# Patient Record
Sex: Female | Born: 1945 | Race: White | Hispanic: No | Marital: Married | Smoking: Former smoker
Health system: Southern US, Community
[De-identification: ages and names within clinical notes are randomized; demographics above are authoritative.]

## PROBLEM LIST (undated history)

## (undated) DIAGNOSIS — Z8709 Personal history of other diseases of the respiratory system: Secondary | ICD-10-CM

## (undated) DIAGNOSIS — K449 Diaphragmatic hernia without obstruction or gangrene: Secondary | ICD-10-CM

## (undated) DIAGNOSIS — Z973 Presence of spectacles and contact lenses: Secondary | ICD-10-CM

## (undated) DIAGNOSIS — K219 Gastro-esophageal reflux disease without esophagitis: Secondary | ICD-10-CM

## (undated) DIAGNOSIS — M549 Dorsalgia, unspecified: Secondary | ICD-10-CM

## (undated) DIAGNOSIS — M542 Cervicalgia: Secondary | ICD-10-CM

## (undated) DIAGNOSIS — M47819 Spondylosis without myelopathy or radiculopathy, site unspecified: Secondary | ICD-10-CM

## (undated) DIAGNOSIS — G5603 Carpal tunnel syndrome, bilateral upper limbs: Secondary | ICD-10-CM

## (undated) DIAGNOSIS — F329 Major depressive disorder, single episode, unspecified: Secondary | ICD-10-CM

## (undated) DIAGNOSIS — S0300XA Dislocation of jaw, unspecified side, initial encounter: Secondary | ICD-10-CM

## (undated) DIAGNOSIS — K589 Irritable bowel syndrome without diarrhea: Secondary | ICD-10-CM

## (undated) DIAGNOSIS — L309 Dermatitis, unspecified: Secondary | ICD-10-CM

## (undated) DIAGNOSIS — C801 Malignant (primary) neoplasm, unspecified: Secondary | ICD-10-CM

## (undated) DIAGNOSIS — M1990 Unspecified osteoarthritis, unspecified site: Secondary | ICD-10-CM

## (undated) DIAGNOSIS — F32A Depression, unspecified: Secondary | ICD-10-CM

## (undated) DIAGNOSIS — G8929 Other chronic pain: Secondary | ICD-10-CM

## (undated) DIAGNOSIS — K5909 Other constipation: Secondary | ICD-10-CM

## (undated) DIAGNOSIS — F419 Anxiety disorder, unspecified: Secondary | ICD-10-CM

## (undated) HISTORY — DX: Unspecified osteoarthritis, unspecified site: M19.90

## (undated) HISTORY — PX: CERVIX LESION DESTRUCTION: SHX591

## (undated) HISTORY — PX: BREAST LUMPECTOMY: SHX2

## (undated) HISTORY — PX: JOINT REPLACEMENT: SHX530

## (undated) HISTORY — PX: REDUCTION MAMMAPLASTY: SUR839

## (undated) HISTORY — PX: OTHER SURGICAL HISTORY: SHX169

## (undated) HISTORY — PX: BREAST BIOPSY: SHX20

## (undated) HISTORY — PX: BLADDER SUSPENSION: SHX72

## (undated) HISTORY — PX: BACK SURGERY: SHX140

---
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
## 1986-04-14 HISTORY — PX: BREAST REDUCTION SURGERY: SHX8

## 1986-04-14 HISTORY — PX: DILATION AND CURETTAGE OF UTERUS: SHX78

## 1992-04-14 HISTORY — PX: INCISION AND DRAINAGE ABSCESS / HEMATOMA OF BURSA / KNEE / THIGH: SUR668

## 1992-04-14 HISTORY — PX: KNEE ARTHROSCOPY: SUR90

## 1997-12-08 ENCOUNTER — Other Ambulatory Visit: Admission: RE | Admit: 1997-12-08 | Discharge: 1997-12-08 | Payer: Self-pay | Admitting: Obstetrics and Gynecology

## 1999-01-30 ENCOUNTER — Other Ambulatory Visit: Admission: RE | Admit: 1999-01-30 | Discharge: 1999-01-30 | Payer: Self-pay | Admitting: Obstetrics and Gynecology

## 1999-01-31 ENCOUNTER — Ambulatory Visit (HOSPITAL_COMMUNITY): Admission: RE | Admit: 1999-01-31 | Discharge: 1999-01-31 | Payer: Self-pay | Admitting: Obstetrics and Gynecology

## 1999-01-31 ENCOUNTER — Encounter: Payer: Self-pay | Admitting: Obstetrics and Gynecology

## 1999-05-02 ENCOUNTER — Encounter (INDEPENDENT_AMBULATORY_CARE_PROVIDER_SITE_OTHER): Payer: Self-pay | Admitting: Specialist

## 1999-05-02 ENCOUNTER — Other Ambulatory Visit: Admission: RE | Admit: 1999-05-02 | Discharge: 1999-05-02 | Payer: Self-pay | Admitting: Internal Medicine

## 2000-02-05 ENCOUNTER — Ambulatory Visit (HOSPITAL_COMMUNITY): Admission: RE | Admit: 2000-02-05 | Discharge: 2000-02-05 | Payer: Self-pay | Admitting: Obstetrics and Gynecology

## 2000-02-05 ENCOUNTER — Encounter: Payer: Self-pay | Admitting: Obstetrics and Gynecology

## 2000-02-27 ENCOUNTER — Other Ambulatory Visit: Admission: RE | Admit: 2000-02-27 | Discharge: 2000-02-27 | Payer: Self-pay | Admitting: Obstetrics and Gynecology

## 2001-02-11 ENCOUNTER — Encounter: Payer: Self-pay | Admitting: Obstetrics and Gynecology

## 2001-02-11 ENCOUNTER — Ambulatory Visit (HOSPITAL_COMMUNITY): Admission: RE | Admit: 2001-02-11 | Discharge: 2001-02-11 | Payer: Self-pay | Admitting: Obstetrics and Gynecology

## 2001-03-03 ENCOUNTER — Other Ambulatory Visit: Admission: RE | Admit: 2001-03-03 | Discharge: 2001-03-03 | Payer: Self-pay | Admitting: Obstetrics and Gynecology

## 2002-02-15 ENCOUNTER — Ambulatory Visit (HOSPITAL_COMMUNITY): Admission: RE | Admit: 2002-02-15 | Discharge: 2002-02-15 | Payer: Self-pay | Admitting: Obstetrics and Gynecology

## 2002-02-15 ENCOUNTER — Encounter: Payer: Self-pay | Admitting: Obstetrics and Gynecology

## 2002-03-04 ENCOUNTER — Other Ambulatory Visit: Admission: RE | Admit: 2002-03-04 | Discharge: 2002-03-04 | Payer: Self-pay | Admitting: Obstetrics and Gynecology

## 2003-03-14 ENCOUNTER — Other Ambulatory Visit: Admission: RE | Admit: 2003-03-14 | Discharge: 2003-03-14 | Payer: Self-pay | Admitting: Obstetrics and Gynecology

## 2003-03-14 ENCOUNTER — Ambulatory Visit (HOSPITAL_COMMUNITY): Admission: RE | Admit: 2003-03-14 | Discharge: 2003-03-14 | Payer: Self-pay | Admitting: Obstetrics and Gynecology

## 2003-09-21 ENCOUNTER — Encounter: Admission: RE | Admit: 2003-09-21 | Discharge: 2003-09-21 | Payer: Self-pay | Admitting: Internal Medicine

## 2004-03-19 ENCOUNTER — Other Ambulatory Visit: Admission: RE | Admit: 2004-03-19 | Discharge: 2004-03-19 | Payer: Self-pay | Admitting: *Deleted

## 2004-03-19 ENCOUNTER — Ambulatory Visit (HOSPITAL_COMMUNITY): Admission: RE | Admit: 2004-03-19 | Discharge: 2004-03-19 | Payer: Self-pay | Admitting: Obstetrics and Gynecology

## 2005-03-31 ENCOUNTER — Ambulatory Visit (HOSPITAL_COMMUNITY): Admission: RE | Admit: 2005-03-31 | Discharge: 2005-03-31 | Payer: Self-pay | Admitting: Obstetrics and Gynecology

## 2005-06-23 ENCOUNTER — Other Ambulatory Visit: Admission: RE | Admit: 2005-06-23 | Discharge: 2005-06-23 | Payer: Self-pay | Admitting: Obstetrics and Gynecology

## 2005-08-19 ENCOUNTER — Ambulatory Visit: Payer: Self-pay | Admitting: Internal Medicine

## 2005-08-22 ENCOUNTER — Ambulatory Visit: Payer: Self-pay | Admitting: Cardiovascular Disease

## 2005-08-22 ENCOUNTER — Encounter: Payer: Self-pay | Admitting: Internal Medicine

## 2005-09-17 ENCOUNTER — Encounter: Admission: RE | Admit: 2005-09-17 | Discharge: 2005-09-17 | Payer: Self-pay | Admitting: Internal Medicine

## 2005-09-22 ENCOUNTER — Encounter (INDEPENDENT_AMBULATORY_CARE_PROVIDER_SITE_OTHER): Payer: Self-pay | Admitting: *Deleted

## 2005-09-22 ENCOUNTER — Ambulatory Visit: Payer: Self-pay | Admitting: Internal Medicine

## 2005-12-22 ENCOUNTER — Ambulatory Visit: Payer: Self-pay | Admitting: Internal Medicine

## 2005-12-23 ENCOUNTER — Encounter: Admission: RE | Admit: 2005-12-23 | Discharge: 2005-12-23 | Payer: Self-pay | Admitting: Thoracic Surgery

## 2005-12-25 ENCOUNTER — Ambulatory Visit: Payer: Self-pay | Admitting: Internal Medicine

## 2006-02-23 ENCOUNTER — Encounter: Admission: RE | Admit: 2006-02-23 | Discharge: 2006-02-23 | Payer: Self-pay | Admitting: Obstetrics and Gynecology

## 2006-02-27 ENCOUNTER — Encounter: Payer: Self-pay | Admitting: Internal Medicine

## 2006-04-09 ENCOUNTER — Ambulatory Visit (HOSPITAL_COMMUNITY): Admission: RE | Admit: 2006-04-09 | Discharge: 2006-04-09 | Payer: Self-pay | Admitting: Obstetrics and Gynecology

## 2006-08-04 ENCOUNTER — Ambulatory Visit: Payer: Self-pay | Admitting: Thoracic Surgery

## 2006-08-04 ENCOUNTER — Encounter: Admission: RE | Admit: 2006-08-04 | Discharge: 2006-08-04 | Payer: Self-pay | Admitting: Thoracic Surgery

## 2006-08-05 ENCOUNTER — Other Ambulatory Visit: Admission: RE | Admit: 2006-08-05 | Discharge: 2006-08-05 | Payer: Self-pay | Admitting: Obstetrics and Gynecology

## 2007-04-12 ENCOUNTER — Ambulatory Visit (HOSPITAL_COMMUNITY): Admission: RE | Admit: 2007-04-12 | Discharge: 2007-04-12 | Payer: Self-pay | Admitting: Obstetrics and Gynecology

## 2007-05-27 ENCOUNTER — Ambulatory Visit: Payer: Self-pay | Admitting: Thoracic Surgery

## 2007-05-27 ENCOUNTER — Encounter: Admission: RE | Admit: 2007-05-27 | Discharge: 2007-05-27 | Payer: Self-pay | Admitting: Thoracic Surgery

## 2007-08-25 ENCOUNTER — Other Ambulatory Visit: Admission: RE | Admit: 2007-08-25 | Discharge: 2007-08-25 | Payer: Self-pay | Admitting: Obstetrics and Gynecology

## 2008-02-23 ENCOUNTER — Ambulatory Visit: Payer: Self-pay | Admitting: Thoracic Surgery

## 2008-02-23 ENCOUNTER — Encounter: Admission: RE | Admit: 2008-02-23 | Discharge: 2008-02-23 | Payer: Self-pay | Admitting: Thoracic Surgery

## 2008-04-19 ENCOUNTER — Ambulatory Visit (HOSPITAL_COMMUNITY): Admission: RE | Admit: 2008-04-19 | Discharge: 2008-04-19 | Payer: Self-pay | Admitting: Internal Medicine

## 2008-04-26 ENCOUNTER — Encounter: Admission: RE | Admit: 2008-04-26 | Discharge: 2008-04-26 | Payer: Self-pay | Admitting: Internal Medicine

## 2008-07-21 ENCOUNTER — Ambulatory Visit (HOSPITAL_BASED_OUTPATIENT_CLINIC_OR_DEPARTMENT_OTHER): Admission: RE | Admit: 2008-07-21 | Discharge: 2008-07-21 | Payer: Self-pay | Admitting: Orthopedic Surgery

## 2008-08-18 ENCOUNTER — Emergency Department (HOSPITAL_COMMUNITY): Admission: EM | Admit: 2008-08-18 | Discharge: 2008-08-18 | Payer: Self-pay | Admitting: Emergency Medicine

## 2009-01-03 ENCOUNTER — Encounter: Payer: Self-pay | Admitting: Nurse Practitioner

## 2009-01-05 ENCOUNTER — Encounter: Admission: RE | Admit: 2009-01-05 | Discharge: 2009-01-05 | Payer: Self-pay | Admitting: Internal Medicine

## 2009-02-28 ENCOUNTER — Ambulatory Visit: Payer: Self-pay | Admitting: Thoracic Surgery

## 2009-02-28 ENCOUNTER — Encounter: Payer: Self-pay | Admitting: Internal Medicine

## 2009-02-28 ENCOUNTER — Encounter: Admission: RE | Admit: 2009-02-28 | Discharge: 2009-02-28 | Payer: Self-pay | Admitting: Thoracic Surgery

## 2009-03-15 ENCOUNTER — Encounter: Payer: Self-pay | Admitting: Internal Medicine

## 2009-03-15 ENCOUNTER — Telehealth: Payer: Self-pay | Admitting: Internal Medicine

## 2009-03-22 ENCOUNTER — Ambulatory Visit: Payer: Self-pay | Admitting: Gastroenterology

## 2009-03-22 DIAGNOSIS — K5900 Constipation, unspecified: Secondary | ICD-10-CM | POA: Insufficient documentation

## 2009-03-22 DIAGNOSIS — Z860100 Personal history of colon polyps, unspecified: Secondary | ICD-10-CM | POA: Insufficient documentation

## 2009-03-22 DIAGNOSIS — R141 Gas pain: Secondary | ICD-10-CM | POA: Insufficient documentation

## 2009-03-22 DIAGNOSIS — F329 Major depressive disorder, single episode, unspecified: Secondary | ICD-10-CM | POA: Insufficient documentation

## 2009-03-22 DIAGNOSIS — F411 Generalized anxiety disorder: Secondary | ICD-10-CM | POA: Insufficient documentation

## 2009-03-22 DIAGNOSIS — F3289 Other specified depressive episodes: Secondary | ICD-10-CM | POA: Insufficient documentation

## 2009-03-22 DIAGNOSIS — R142 Eructation: Secondary | ICD-10-CM

## 2009-03-22 DIAGNOSIS — K219 Gastro-esophageal reflux disease without esophagitis: Secondary | ICD-10-CM | POA: Insufficient documentation

## 2009-03-22 DIAGNOSIS — R143 Flatulence: Secondary | ICD-10-CM

## 2009-03-26 ENCOUNTER — Telehealth: Payer: Self-pay | Admitting: Internal Medicine

## 2009-03-28 ENCOUNTER — Telehealth: Payer: Self-pay | Admitting: Nurse Practitioner

## 2009-03-30 ENCOUNTER — Ambulatory Visit: Payer: Self-pay | Admitting: Gastroenterology

## 2009-03-30 DIAGNOSIS — R933 Abnormal findings on diagnostic imaging of other parts of digestive tract: Secondary | ICD-10-CM | POA: Insufficient documentation

## 2009-03-30 DIAGNOSIS — R1031 Right lower quadrant pain: Secondary | ICD-10-CM | POA: Insufficient documentation

## 2009-04-02 LAB — CONVERTED CEMR LAB
ALT: 27 units/L (ref 0–35)
AST: 31 units/L (ref 0–37)
Albumin: 3.7 g/dL (ref 3.5–5.2)
Alkaline Phosphatase: 57 units/L (ref 39–117)
BUN: 10 mg/dL (ref 6–23)
Basophils Absolute: 0.1 10*3/uL (ref 0.0–0.1)
Basophils Relative: 1 % (ref 0.0–3.0)
CO2: 32 meq/L (ref 19–32)
Calcium: 9.3 mg/dL (ref 8.4–10.5)
Chloride: 103 meq/L (ref 96–112)
Creatinine, Ser: 0.8 mg/dL (ref 0.4–1.2)
Eosinophils Absolute: 0.1 10*3/uL (ref 0.0–0.7)
Eosinophils Relative: 2.3 % (ref 0.0–5.0)
GFR calc non Af Amer: 77.24 mL/min (ref >60)
Glucose, Bld: 89 mg/dL (ref 70–99)
HCT: 43.1 % (ref 36.0–46.0)
Hemoglobin: 15 g/dL (ref 12.0–15.0)
Lymphocytes Relative: 36.7 % (ref 12.0–46.0)
Lymphs Abs: 1.9 10*3/uL (ref 0.7–4.0)
MCHC: 34.8 g/dL (ref 30.0–36.0)
MCV: 97.5 fL (ref 78.0–100.0)
Monocytes Absolute: 0.9 10*3/uL (ref 0.1–1.0)
Monocytes Relative: 17.8 % — ABNORMAL HIGH (ref 3.0–12.0)
Neutro Abs: 2.1 10*3/uL (ref 1.4–7.7)
Neutrophils Relative %: 42.2 % — ABNORMAL LOW (ref 43.0–77.0)
Platelets: 239 10*3/uL (ref 150.0–400.0)
Potassium: 4 meq/L (ref 3.5–5.1)
RBC: 4.42 M/uL (ref 3.87–5.11)
RDW: 12.4 % (ref 11.5–14.6)
Sodium: 139 meq/L (ref 135–145)
Total Bilirubin: 0.7 mg/dL (ref 0.3–1.2)
Total Protein: 6 g/dL (ref 6.0–8.3)
WBC: 5.1 10*3/uL (ref 4.5–10.5)

## 2009-04-03 ENCOUNTER — Ambulatory Visit: Payer: Self-pay | Admitting: Cardiology

## 2009-04-09 ENCOUNTER — Ambulatory Visit: Payer: Self-pay | Admitting: Gastroenterology

## 2009-04-18 ENCOUNTER — Telehealth: Payer: Self-pay | Admitting: Internal Medicine

## 2009-04-26 ENCOUNTER — Ambulatory Visit (HOSPITAL_COMMUNITY): Admission: RE | Admit: 2009-04-26 | Discharge: 2009-04-26 | Payer: Self-pay | Admitting: Obstetrics and Gynecology

## 2009-05-23 ENCOUNTER — Telehealth: Payer: Self-pay | Admitting: Nurse Practitioner

## 2009-06-08 ENCOUNTER — Ambulatory Visit: Payer: Self-pay | Admitting: Internal Medicine

## 2009-06-08 DIAGNOSIS — R109 Unspecified abdominal pain: Secondary | ICD-10-CM | POA: Insufficient documentation

## 2009-06-21 ENCOUNTER — Telehealth: Payer: Self-pay | Admitting: Internal Medicine

## 2009-06-22 ENCOUNTER — Ambulatory Visit (HOSPITAL_COMMUNITY): Admission: RE | Admit: 2009-06-22 | Discharge: 2009-06-22 | Payer: Self-pay | Admitting: Internal Medicine

## 2009-06-25 ENCOUNTER — Telehealth: Payer: Self-pay | Admitting: Internal Medicine

## 2009-07-02 ENCOUNTER — Ambulatory Visit (HOSPITAL_COMMUNITY): Admission: RE | Admit: 2009-07-02 | Discharge: 2009-07-02 | Payer: Self-pay | Admitting: Internal Medicine

## 2009-07-09 ENCOUNTER — Telehealth: Payer: Self-pay | Admitting: Internal Medicine

## 2009-07-12 ENCOUNTER — Telehealth: Payer: Self-pay | Admitting: Internal Medicine

## 2009-09-12 ENCOUNTER — Telehealth: Payer: Self-pay | Admitting: Internal Medicine

## 2009-09-26 ENCOUNTER — Telehealth: Payer: Self-pay | Admitting: Internal Medicine

## 2009-11-12 ENCOUNTER — Telehealth: Payer: Self-pay | Admitting: Internal Medicine

## 2010-04-30 ENCOUNTER — Ambulatory Visit (HOSPITAL_COMMUNITY)
Admission: RE | Admit: 2010-04-30 | Discharge: 2010-04-30 | Payer: Self-pay | Source: Home / Self Care | Attending: Obstetrics and Gynecology | Admitting: Obstetrics and Gynecology

## 2010-05-05 ENCOUNTER — Encounter: Payer: Self-pay | Admitting: Internal Medicine

## 2010-05-07 ENCOUNTER — Ambulatory Visit
Admission: RE | Admit: 2010-05-07 | Discharge: 2010-05-07 | Payer: Self-pay | Source: Home / Self Care | Attending: Sports Medicine | Admitting: Sports Medicine

## 2010-05-07 DIAGNOSIS — M545 Low back pain, unspecified: Secondary | ICD-10-CM | POA: Insufficient documentation

## 2010-05-07 DIAGNOSIS — M5137 Other intervertebral disc degeneration, lumbosacral region: Secondary | ICD-10-CM | POA: Insufficient documentation

## 2010-05-14 NOTE — Progress Notes (Signed)
Summary: TRIAGE-Condition Update  Phone Note Call from Patient Call back at [phone]   Caller: Patient Call For: Dr. Juanda Chance Reason for Call: Talk to Nurse Summary of Call: Pt. was seen on Thurs and is still not feeling well. She states she is having loose stool. Initial call taken by: Karna Christmas,  March 26, 2009 8:12 AM  Follow-up for Phone Call        Pt. was seen by Willette Cluster NP on 03-22-09. She did her bowel prep. and states she passed a moderate amt. of stool. She started Amitizia two times a day and is using Bentyl 10mg  two times a day. States she is unchanged from the appt., continues with abd. pain & cramping, doesn't feel well. She is passing loose stool, states it is colored now, was pale before.   DR.Shamond Skelton PLEASE ADVISE  Follow-up by: Laureen Ochs LPN,  March 26, 2009 10:38 AM  Additional Follow-up for Phone Call Additional follow up Details #1::        Cipro 250 mg by mouth two times a day x 1 week, Flagyl 250 mg by mouth three times a day x 1 week, then call back or see Gunnar Fusi Additional Follow-up by: Hart Carwin MD,  March 26, 2009 1:33 PM    Additional Follow-up for Phone Call Additional follow up Details #2::    Above MD orders reviewed with patient. She will keep f/u appt. on 04-09-09 with Willette Cluster NP. Pt. instructed to call back as needed.  Follow-up by: Laureen Ochs LPN,  March 26, 2009 2:29 PM  New/Updated Medications: CIPROFLOXACIN HCL 250 MG  TABS (CIPROFLOXACIN HCL) Take 1 twice a day times 7 days. METRONIDAZOLE 250 MG TABS (METRONIDAZOLE) Take one three times daily for 7 days. Prescriptions: METRONIDAZOLE 250 MG TABS (METRONIDAZOLE) Take one three times daily for 7 days.  #21 x 0   Entered by:   Laureen Ochs LPN   Authorized by:   Hart Carwin MD   Signed by:   Laureen Ochs LPN on 16/01/9603   Method used:   Electronically to        [facility]. [phone]* (retail)       [address]  [place], [place]  [address]       Ph: [phone]       Fax: [phone]   RxID:   [id number] CIPROFLOXACIN HCL 250 MG  TABS (CIPROFLOXACIN HCL) Take 1 twice a day times 7 days.  #14 x 0   Entered by:   Laureen Ochs LPN   Authorized by:   Hart Carwin MD   Signed by:   Laureen Ochs LPN on 53/66/4403   Method used:   Electronically to        [facility]. [phone]* (retail)       [address]       [place], [place]  [address]       Ph: [phone]       Fax: [phone]   RxID:   [id number]

## 2010-05-14 NOTE — Assessment & Plan Note (Signed)
Summary: F/U ABD pain, constipation, saw Willette Cluster RNP   History of Present Illness Visit Type: Follow-up Visit Primary GI MD: Lina Sar MD Primary Provider: Theressa Millard MD Requesting Provider: n/a Chief Complaint: F/u for abd pain and constipation. Pt states that she is feeling better but some days she will have stomach pain History of Present Illness:   Ms. Sherry [name] is a 65 year old female whom we have followed in the past for a history of GERD, dyspepsia and a family history of colon cancer. Patient has had several appointments with Willette Cluster, NP after an acute work in appointment on 03/22/09 for acute on chronic constipation and a history of bloating and abdominal cramps. Patient gave a history of chronic constipation decribed as small, pellet stools. She was concerned about her several week history of worsening constipation, abdominal cramps and bloating. She had intermittent lower abdominal cramping related to meals but not alleviated with defecation. She had some mild nausea at the time of her inital visit with Gunnar Fusi, but had no fevers, weight loss or rectal bleeding. Patient was given amitiza 8 micrograms to take two times a day as well as miralax and bentyl.  Patient's CBC and CMET drawn at the time of her visit were both essentially normal. The patient later had a CT scan of the abdomen and pelvis which showed no acute abnormality. There was however, a small hiatal hernia noted. There were no masses or adenopathy and no abdominal wall hernias. Degenerative change throughout the facet joints of the lumbar spine were also noted. There was a moderate amount of feces throughout the colon. Patient comes today for a follow up. Patient states that she is feeling better but still has some abdominal discomfort. Her symptoms usually flareup when she travels. She is leaving for [place]. She questions the possibly of gastroparesis, although  her symptoms are not suggestive of that. She  denies being depressed or having stress but she admits to anxiety.     GI Review of Systems      Denies abdominal pain, acid reflux, belching, bloating, chest pain, dysphagia with liquids, dysphagia with solids, heartburn, loss of appetite, nausea, vomiting, vomiting blood, weight loss, and  weight gain.        Denies anal fissure, black tarry stools, change in bowel habit, constipation, diarrhea, diverticulosis, fecal incontinence, heme positive stool, hemorrhoids, irritable bowel syndrome, jaundice, light color stool, liver problems, rectal bleeding, and  rectal pain.    Current Medications (verified): 1)  Fish Oil 300 Mg Caps (Omega-3 Fatty Acids) .... 2 By Mouth Two Times A Day 2)  Activella 0.5-0.1 Mg Tabs (Estradiol-Norethindrone Acet) .Marland Kitchen.. 1 By Mouth Once Daily 3)  Multivitamins  Caps (Multiple Vitamin) .Marland Kitchen.. 1 By Mouth Once Daily 4)  Amitiza 8 Mcg Caps (Lubiprostone) .... Take 1 Tab Two Times A Day 5)  Bentyl 10 Mg Caps (Dicyclomine Hcl) .... Take 1 Tab Twice Daily  As Needed For Spasms and Cramping 6)  Miralax  Powd (Polyethylene Glycol 3350) .... Mix One Scoop in Water Once A Day  Allergies (verified): No Known Drug Allergies  Past History:  Past Medical History: Reviewed history from 03/30/2009 and no changes required. Right Lung Nodules (likely Benign). Constipation  Past Surgical History: Reviewed history from 03/22/2009 and no changes required. Knee Arthroscopy Breast Biopsy Breast reduction  Family History: Reviewed history from 03/30/2009 and no changes required. Parkinson- Mom Family History of Kidney Disease: father  Family History of Colon Cancer: ? MGF  Social  History: Reviewed history from 03/22/2009 and no changes required. Patient has never smoked.  Alcohol Use - yes occasional Daily Caffeine Use 1 per day Illicit Drug Use - no Patient does not get regular exercise.   Review of Systems  The patient denies allergy/sinus, anemia, anxiety-new,  arthritis/joint pain, back pain, blood in urine, breast changes/lumps, change in vision, confusion, cough, coughing up blood, depression-new, fainting, fatigue, fever, headaches-new, hearing problems, heart murmur, heart rhythm changes, itching, menstrual pain, muscle pains/cramps, night sweats, nosebleeds, pregnancy symptoms, shortness of breath, skin rash, sleeping problems, sore throat, swelling of feet/legs, swollen lymph glands, thirst - excessive , urination - excessive , urination changes/pain, urine leakage, vision changes, and voice change.         Pertinent positive and negative review of systems were noted in the above HPI. All other ROS was otherwise negative.   Vital Signs:  Patient profile:   65 year old female Height:      65 inches Weight:      147 pounds BMI:     24.55 BSA:     1.74 Pulse rate:   76 / minute Pulse rhythm:   regular BP sitting:   122 / 60  (left arm) Cuff size:   regular  Vitals Entered By: Ok Anis CMA (June 08, 2009 11:11 AM)  Physical Exam  General:  alert, oriented, very cooperative. She is very pleasant and does not appear to be in any distress. Eyes:  PERRLA, no icterus. Mouth:  No deformity or lesions, dentition normal. Neck:  Supple; no masses or thyromegaly. Lungs:  Clear throughout to auscultation. Heart:  Regular rate and rhythm; no murmurs, rubs,  or bruits. Abdomen:  soft relaxed abdomen with normoactive bowel sounds and minimal tenderness in the right middle quadrant which does not radiate to the back. Liver edge is at the costal margin. There is no distention. Left lower and upper quadrants are unremarkable. Rectal:  normal rectal tone. Stool is soft and Hemoccult-negative. Extremities:  No clubbing, cyanosis, edema or deformities noted. Skin:  Intact without significant lesions or rashes. Psych:  Alert and cooperative. Normal mood and affect.   Impression & Recommendations:  Problem # 1:  ABDOMINAL PAIN, UNSPECIFIED SITE  (ICD-789.00) Patient has intermittent lower abdominal discomfort, bloating and food intolerance associated with occasional nausea and dyspepsia. No particular food group has been causing it. A gluten-free diet did not seem to help. Her sprue profile was negative. An ultrasound of the gallbladder in 2007 was negative. Because of the tenderness and right middle quadrant abdominal pain, we will proceed with a sonogram and a HIDA scan with CCK. We will try Xifaxan for bacterial overgrowth and Paxil 10 mg daily for IBS. I have given her samples of Benefiber to take daily and avoid high fatty foods which tend to cause gas. I would consider a gastric emptying scan at some point if her symptoms continue and I would also consider an upper endoscopy with small bowel biopsies. Orders: HIDA CCK (HIDA CCK) Ultrasound Abdomen (UAS)  Problem # 2:  PERSONAL HX COLONIC POLYPS (ICD-V12.72) Patient's last colonoscopy was in June 2007 and prior to that in 2001 which showed a hyperplastic polyp. Her next colonoscopy will be due in June 2012.  Problem # 3:  FAMILY HX COLON CANCER (ICD-V16.0) Patient will be due for a colonoscopy in June 2012. She has a positive family history of colon cancer in her maternal grandmother.  Patient Instructions: 1)  Xifaxan 550 mg p.o. q.d. x 7  days. 2)  Continue MiraLax 9 g daily. 3)  Upper abdominal ultrasound and HIDA scan with CCK. 4)  Benefiber 1 tablespoon daily. 5)  Paxil 10 mg daily. 6)  Copy sent to : Dr Doreatha Massed 7)  The medication list was reviewed and reconciled.  All changed / newly prescribed medications were explained.  A complete medication list was provided to the patient / caregiver. Prescriptions: XIFAXAN 550 MG TABS (RIFAXIMIN) Take 1 tablet by mouth once a day x 7 days  #14 x 0   Entered by:   Hortense Ramal CMA (AAMA)   Authorized by:   Hart Carwin MD   Signed by:   Hortense Ramal CMA (AAMA) on 06/08/2009   Method used:   Electronically to        [facility]. [phone]* (retail)       [address]       [place], [place]  [address]       Ph: [phone]       Fax: [phone]   RxID:   [id number] PAXIL 20 MG TABS (PAROXETINE HCL) Take 1/2 tablet by mouth at bedtime  #30 x 1   Entered by:   Hortense Ramal CMA (AAMA)   Authorized by:   Hart Carwin MD   Signed by:   Hortense Ramal CMA (AAMA) on 06/08/2009   Method used:   Electronically to        [facility]. [phone]* (retail)       [address]       [place], [place]  [address]       Ph: [phone]       Fax: [phone]   RxID:   [id number]

## 2010-05-14 NOTE — Progress Notes (Signed)
Summary: TRIAGE  Phone Note From Other Clinic Call back at [phone]   Caller: Leotis Shames, secretary Call For: Dr. Juanda Chance Reason for Call: Schedule Patient Appt Summary of Call: pt has worsening IBS and constipation... Dr. Earl Gala would like pt worked in asap Initial call taken by: Vallarie Mare,  March 15, 2009 3:46 PM  Follow-up for Phone Call        Pt. will see Willette Cluster NP on 03-19-09 at 2pm. Lauren will advise pt. of appt/med.list/co-pay/cx.policy and she wil fax notes. Follow-up by: Laureen Ochs LPN,  March 15, 2009 4:37 PM

## 2010-05-14 NOTE — Progress Notes (Signed)
Summary: Questions about meds  Phone Note Call from Patient Call back at Home Phone [phone] Call back at [phone]   Caller: Patient Call For: Dr. Juanda Chance Reason for Call: Talk to Nurse Summary of Call: pt. has some questions about her Amitiza med. Initial call taken by: Karna Christmas,  November 12, 2009 1:49 PM  Follow-up for Phone Call        Left message for patient to call back Darcey Nora RN, [facility]  November 12, 2009 2:14 PM  patient reports she is doing well on Amitiza two times a day, but her insurance won't cover it any longer.  Patient  is requesting an alternative med or decrease Amitiza.  I have provided her with samples. She will try and decrease to one time and day and if she developes constipation she will increase to 2.  I have advised her she may call for more samples if needed. Follow-up by: Darcey Nora RN, CGRN,  November 12, 2009 2:59 PM    New/Updated Medications: AMITIZA 8 MCG CAPS (LUBIPROSTONE) Take 1 tab two times a day

## 2010-05-14 NOTE — Progress Notes (Signed)
Summary: medication refill  Phone Note Call from Patient Call back at Home Phone [phone]   Caller: Patient Call For: Dr. Juanda Chance Reason for Call: Refill Medication Summary of Call: needs a refill of Bentyl.[facility] Initial call taken by: Karna Christmas,  September 26, 2009 2:19 PM  Follow-up for Phone Call        Rx Called In Follow-up by: Lamona Curl CMA Duncan Dull),  September 26, 2009 2:35 PM    New/Updated Medications: BENTYL 10 MG CAPS (DICYCLOMINE HCL) Take 1 tab twice daily as needed for spasms and cramping Prescriptions: BENTYL 10 MG CAPS (DICYCLOMINE HCL) Take 1 tab twice daily as needed for spasms and cramping  #60 x 1   Entered by:   Lamona Curl CMA (AAMA)   Authorized by:   Hart Carwin MD   Signed by:   Lamona Curl CMA (AAMA) on 09/26/2009   Method used:   Electronically to        [facility]. [phone]* (retail)       [address]       [place], [place]  [address]       Ph: [phone]       Fax: [phone]   RxID:   [id number]

## 2010-05-14 NOTE — Progress Notes (Signed)
Summary: Triage-Condition Update  Phone Note Call from Patient Call back at Home Phone [phone] Call back at [phone] Cell   Caller: Patient Call For: Dr. Juanda Chance Reason for Call: Talk to Nurse Summary of Call: Condition Update: pt is still constipated. Has had 1 BM in a week. Saw Dr. Evelene Croon this morning and switched from Paxil to Prozac. Feels like the meds maybe causing the constipation. Initial call taken by: Karna Christmas,  July 12, 2009 9:27 AM  Follow-up for Phone Call        F/U from triage 07-09-09.  Pt. continues w/constipation. States Dr.Kaur feels the Paxil may have been contributing to the constipation and switched her to Prozac. Pt. will increase her Miralax to three times a day until constipation improves. Pt. instructed to call back as needed. I will call pt., if new orders, after MD reviews.   Follow-up by: Laureen Ochs LPN,  July 12, 2009 10:08 AM  Additional Follow-up for Phone Call Additional follow up Details #1::        reviewed and agree. Additional Follow-up by: Hart Carwin MD,  July 12, 2009 1:10 PM

## 2010-05-14 NOTE — Progress Notes (Signed)
Summary: Follow up appt   Phone Note Outgoing Call   Call placed by: Joselyn Glassman,  May 23, 2009 10:53 AM Call placed to: Patient Summary of Call: Pt saw Willette Cluster RNP on 04-09-10.  At that time Gunnar Fusi suggested the pt see Dr. Juanda Chance as a follow up.  I made appt for 2-15 11 but accidentally made it under Dr. Christella Hartigan schedule. (He was the supervising MD on 12-17).  I cancelled the appt for 2-15 with Dr.Jjacobs and called the pt. She said she had to cancel another appt on 2-15 because she wanted to be here to see Dr. Juanda Chance.  I apologized for her inconvenience and made her an appt for with Dr. Juanda Chance on 06-08-09.  I told her I would check Dr. Regino Schultze scheduled daily to see if there is a cancellation for a sooner appt.  I see there is a triage with Dr. Juanda Chance and Gavin Pound for 04-18-09.  Initial call taken by: Joselyn Glassman,  May 23, 2009 10:58 AM

## 2010-05-14 NOTE — Procedures (Signed)
Summary: EGD   EGD  Procedure date:  02/27/2006  Findings:      Location: [facility]   Patient Name: Sherry [name], Sherry [name] MRN:  Procedure Procedures: Panendoscopy (EGD) CPT: 43235.    with biopsy(s)/brushing(s). CPT: D1846139.  Personnel: Endoscopist: Rosalinda Seaman L. Juanda Chance, MD.  Exam Location: Exam performed in Outpatient Clinic. Outpatient  Patient Consent: Procedure, Alternatives, Risks and Benefits discussed, consent obtained, from patient. Consent was obtained by the RN.  Indications Symptoms: Reflux symptoms for 1-5 yrs,  Comments: reflux  controlled with Prevacid History  Current Medications: Patient is not currently taking Coumadin.  Pre-Exam Physical: Performed Sep 22, 2005  Entire physical exam was normal.  Comments: Pt. history reviewed/updated, physical exam performed prior to initiation of sedation? Exam Exam Info: Maximum depth of insertion Duodenum, intended Duodenum. Vocal cords visualized. Gastric retroflexion performed. Images taken. ASA Classification: I. Tolerance: good.  Sedation Meds: Patient assessed and found to be appropriate for moderate (conscious) sedation. Fentanyl 50 mcg. given IV. Versed 5 mg. given IV. Cetacaine Spray 2 sprays given aerosolized.  Monitoring: BP and pulse monitoring done. Oximetry used. Supplemental O2 given  Findings - HIATAL HERNIA: Diaphragm 35 cm from mouth. Z-line/GE Junction 32 cm from mouth. 3 cms. in length. partially reducible hernia. Biopsy/Hiatal Hernia taken.  ICD9: Hernia, Hiatal: 553.3.Comments: biopsies of the normal appearing g-e junction.  DIAGNOSTIC TEST: from Antrum.  Normal: Duodenal Apex.   Assessment Abnormal examination, see findings above.  Diagnoses: 553.3: Hernia, Hiatal.   Comments: 3 cm hiatal hernia without esophagitis Events  Unplanned Intervention: No unplanned interventions were required.  Unplanned Events: There were no complications. Plans Medication(s): Await  pathology. PPI: Lansoprazole/Prevacid 30 mg QD, starting Sep 22, 2005   Patient Education: Patient given standard instructions for: Hiatal Hernia. Reflux.  Disposition: After procedure patient sent to recovery. After recovery patient sent home.   This report was created from the original endoscopy report, which was reviewed and signed by the above listed endoscopist.      FINAL DIAGNOSIS    ***MICROSCOPIC EXAMINATION AND DIAGNOSIS***    ESOPHAGOGASTRIC JUNCTION, BIOPSY: ESOPHAGOGASTRIC JUNCTION   MUCOSA WITH MILD INFLAMMATION. NO INTESTINAL METAPLASIA,   DYSPLASIA OR MALIGNANCY IDENTIFIED (BIOPSY)    COMMENT   An Alcian Blue stain is performed to determine the presence of   intestinal metaplasia (goblet cell metaplasia). No intestinal   metaplasia (goblet cell metaplasia) is identified with the Alcian   Blue stain. The control stained appropriately. There is   gastroesophageal junction mucosa in which there is slight chronic   inflammation present. No intraepithelial eosinophils is seen in   the squamous component. There is no atypia or evidence of   malignancy. (JAS:kv 09-23-05)    kv   Date Reported: 09/23/2005 Berneta Levins, MD   *** Electronically Signed Out By JAS ***    Clinical information   (jes)    specimen(s) obtained   Esophagogastric junction, biopsy    Gross Description   Received in formalin are tan, soft tissue fragments that are   submitted in toto. Number: multiple   Size: 0.2 to 0.4 cm one block (BJ:jes, 09/23/05)    jes/

## 2010-05-14 NOTE — Letter (Signed)
Summary: [facility]  [facility]   Imported By: Sherian Rein 03/19/2009 09:34:58  _____________________________________________________________________  External Attachment:    Type:   Image     Comment:   External Document

## 2010-05-14 NOTE — Letter (Signed)
Summary: [facility] @ [place]   Imported By: Lester [place] 03/29/2009 08:38:32  _____________________________________________________________________  External Attachment:    Type:   Image     Comment:   External Document

## 2010-05-14 NOTE — Progress Notes (Signed)
Summary: TRIAGE  Phone Note Call from Patient Call back at [phone]   Caller: Patient Call For: Juanda Chance Reason for Call: Talk to Nurse Summary of Call: Patient states that she was doing really well up until this weekend, states that she had really bad pains in her stomach and feels really "miserable". Initial call taken by: Tawni Levy,  July 09, 2009 1:37 PM  Follow-up for Phone Call        Pt. c/o constipation while on vacation, took Dulcolax when she got back, she did have a BM this morning. States her abd hurts, she couldn't get up until noon today "And now I am just dragging" Denies blood, black stools, fever, n/v.  Takes Miralax and Amitizia two times a day.   1) Take an extra dose of Miralax today 2) Continue other meds as directed 3) Bentyl two times a day for 5 days, then as needed 4) Soft,bland diet. No spicy,greasy,fried foods. Plenty of daily fluids.Advance diet as tolerated. 5) Heating pad to abdomen as needed. 6) If symptoms become worse call back immediately. 7) I will call pt., if new orders, after MD reviews.    Follow-up by: Laureen Ochs LPN,  July 09, 2009 2:50 PM  Additional Follow-up for Phone Call Additional follow up Details #1::        reviewed and agree Additional Follow-up by: Hart Carwin MD,  July 09, 2009 10:37 PM

## 2010-05-14 NOTE — Progress Notes (Signed)
Summary: TRIAGE  Phone Note Call from Patient Call back at [phone]   Caller: Patient Call For: Sherry [name] Reason for Call: Talk to Nurse Summary of Call: IBS flare up Initial call taken by: Tawni Levy,  April 18, 2009 11:35 AM  Follow-up for Phone Call        Message left for patient to callback.Laureen Ochs LPN  April 18, 2009 11:42 AM   Pt. has IBS, states she was fine for 11/2 weeks, "All of a sudden I am in a bad way"  Was in "Agonizing pain last night when I went to bed"  Had loose/watery stools multiple times this morning.  At this time the pain has decreased, continues with bloating, no more stools.  Denies blood, black stools, n/v, fever.   1) Full liquid diet x24 hours. Advance to soft,bland/BRAT diet x2-4 days. Advanced as tolerated. 2) Bentyl two times a day for 5 days, then two times a day as needed 3) Gas-x,Phazyme, etc. as needed for gas & bloating. 4) Immodium as needed for diarrhea. 5) If symptoms become worse call back immediately. 6) I will call pt., if new orders, after MD reviews.    Follow-up by: Laureen Ochs LPN,  April 18, 2009 1:30 PM  Additional Follow-up for Phone Call Additional follow up Details #1::        May be she is having  acute gastroenteritis. Make sure she is on Bentyl 20 mg by mouth three times a day x 3-4 days,  consider stool culture if diarrhea persisits Additional Follow-up by: Hart Carwin MD,  April 19, 2009 8:39 AM     Appended Document: TRIAGE Message left for pt. with above MD instructions.Pt. instructed to call back as needed.

## 2010-05-14 NOTE — Progress Notes (Signed)
Summary: TRIAGE  Phone Note Call from Patient Call back at Home Phone [phone] Call back at [phone]  (cell)   Caller: Patient Call For: Dr. Juanda Chance Reason for Call: Talk to Nurse Summary of Call: pt thinks she needs a f/u appt to discuss progress of new medications pt has been taking... pt also wants to know if she needs to continue taking Amiteza, her rx says "no refills", but pt says she was never instructed by Dr. Juanda Chance as to what she needed to do when her rx ran out Initial call taken by: Vallarie Mare,  June 21, 2009 1:37 PM  Follow-up for Phone Call        DR.Pegge Cumberledge- 1) Should pt. take a 2nd round of Xifaxan? I reviewed the instructions from the OV on 06-08-09, but she states you gave her different instructions, please clarify.  2) Pt. is aware we will call with Ultrasound results after Dr.Javyon Fontan reviews.   3) Amitiza refills sent to pt. pharmacy.  4) When does pt. need an office follow-up scheduled?  Follow-up by: Laureen Ochs LPN,  June 21, 2009 3:20 PM  Additional Follow-up for Phone Call Additional follow up Details #1::        no refill on Xifaxan, Ultrasound was normal. Please schedule HIDA scan with [facility] if it has not been scheduled yet. Continue Amtiza once daily. Continue Paxil 10 mg by mouth hs. OV 3 months Additional Follow-up by: Hart Carwin MD,  June 22, 2009 1:02 PM    Additional Follow-up for Phone Call Additional follow up Details #2::    Above MD orders reviewed with patient. HIDA Scan is on Dr.Dellanira Dillow's desk, I will call pt. with results after it is reviewed. Pt. instructed to call back as needed.  Follow-up by: Laureen Ochs LPN,  June 22, 2009 3:28 PM  Prescriptions: AMITIZA 8 MCG CAPS (LUBIPROSTONE) Take 1 tab two times a day  #60 x 6   Entered by:   Laureen Ochs LPN   Authorized by:   Hart Carwin MD   Signed by:   Laureen Ochs LPN on 47/82/9562   Method used:   Electronically to        [facility]. [phone]*  (retail)       [address]       [place], [place]  [address]       Ph: [phone]       Fax: [phone]   RxID:   [id number]

## 2010-05-14 NOTE — Progress Notes (Signed)
Summary: TRIAGE  Phone Note Call from Patient Call back at cell [phone]   Caller: Patient Call For: BRODIE Reason for Call: Talk to Nurse Summary of Call: Patient has flare up IBS wants to know is she needs anitibiotics? Initial call taken by: Tawni Levy,  September 12, 2009 12:04 PM  Follow-up for Phone Call        Pt. c/o abd. cramping and gurgling. She was constipated, but took a Dulcolax last night, had a good BM, but abd. continues to hurt.  Denies blood, black stools, fever, n/v.  Takes Amatizia two times a day.  Pt. states she was given an antibiotic at one point and that it helped, "Maybe I need to take it again"  1) Continue Amatizia as directed 2) Use your Bentyl 10mg  two times a day for 5 days, then as needed 3) Soft,bland diet. No spicy,greasy,fried foods. Ample daily fluids. 4) I will call pt., if new orders, after MD reviews.  Follow-up by: Laureen Ochs LPN,  September 13, 979 1:14 PM  Additional Follow-up for Phone Call Additional follow up Details #1::        Xifaxan 550mg ,  # 14, 1 by mouth two times a day, 1 refill, Additional Follow-up by: Hart Carwin MD,  September 12, 2009 1:23 PM    Additional Follow-up for Phone Call Additional follow up Details #2::    Above MD orders reviewed with patient. Pt. instructed to call back as needed.  Follow-up by: Laureen Ochs LPN,  September 12, 1912 1:39 PM  New/Updated Medications: XIFAXAN 550 MG TABS (RIFAXIMIN) Take 1 tablet by mouth twice a day x 7 days. Prescriptions: XIFAXAN 550 MG TABS (RIFAXIMIN) Take 1 tablet by mouth twice a day x 7 days.  #14 x 1   Entered by:   Laureen Ochs LPN   Authorized by:   Hart Carwin MD   Signed by:   Laureen Ochs LPN on 78/29/5621   Method used:   Electronically to        [facility]. [phone]* (retail)       [address]       [place], [place]  [address]       Ph: [phone]       Fax: [phone]   RxID:   [id number]

## 2010-05-14 NOTE — Procedures (Signed)
Summary: COLON   Colonoscopy  Procedure date:  02/17/2006  Findings:      Location:  [facility].   Patient Name: Sherry [name], Sherry [name] MRN:  Procedure Procedures: Colonoscopy CPT: [phone].  Personnel: Endoscopist: Luismiguel Lamere L. Juanda Chance, MD.  Exam Location: Exam performed in Outpatient Clinic. Outpatient  Patient Consent: Procedure, Alternatives, Risks and Benefits discussed, consent obtained, from patient. Consent was obtained by the RN.  Indications  Surveillance of: Adenomatous Polyp(s). Initial polypectomy was performed in 2001. 1-2 Polyps were found at Index Exam. Largest polyp removed was 1 to 5 mm. Prior polyp located in distal colon. Pathology of worst  polyp: hyperplastic.  History  Current Medications: Patient is not currently taking Coumadin.  Pre-Exam Physical: Performed Sep 22, 2005. Entire physical exam was normal.  Comments: Pt. history reviewed/updated, physical exam performed prior to initiation of sedation?yes Exam Exam: Extent of exam reached: Cecum, extent intended: Cecum.  The cecum was identified by appendiceal orifice and IC valve. Colon retroflexion performed. Images taken. ASA Classification: I. Tolerance: good.  Monitoring: Pulse and BP monitoring, Oximetry used. Supplemental O2 given.  Colon Prep Used Miralax for colon prep. Prep results: good.  Sedation Meds: Patient assessed and found to be appropriate for moderate (conscious) sedation. Fentanyl 25 mcg. given IV. Versed 2 mg. given IV.  Findings - NORMAL EXAM: Cecum.  - NORMAL EXAM: Rectum.    Comments: scope withdrawal  time was about 4 1/2 minutes Events  Unplanned Interventions: No intervention was required.  Unplanned Events: There were no complications. Plans Disposition: After procedure patient sent to recovery. After recovery patient sent home.    This report was created from the original endoscopy report, which was reviewed and signed by the above listed  endoscopist.

## 2010-05-14 NOTE — Assessment & Plan Note (Signed)
Summary: F/U RLQ pain, constipation   History of Present Illness Visit Type: follow up  Primary GI MD: Lina Sar MD Primary Provider: Theressa Millard MD Requesting Provider: n/a Chief Complaint: F/u for RLQ abd pain, and constipation. Pt states that the pain is better but her stool is coming out pencil thin  History of Present Illness:   Ms. Meany is back for recheck if worsening constipation, abdominal pain. She took Miralax prep and felt she did evacuate her bowels but is still having lower abdominal pain. A few days ago patient called our office to give a condition update, was still reporting abdominal pain and was started on Cipro and Flagyl. She feels slightly better but still very uncomfortable. Her stools are now small in caliber.   GI Review of Systems    Reports abdominal pain.     Location of  Abdominal pain: lower abdomen.    Denies acid reflux, belching, bloating, chest pain, dysphagia with liquids, dysphagia with solids, heartburn, loss of appetite, nausea, vomiting, vomiting blood, weight loss, and  weight gain.      Reports constipation.     Denies anal fissure, black tarry stools, change in bowel habit, diarrhea, diverticulosis, fecal incontinence, heme positive stool, hemorrhoids, irritable bowel syndrome, jaundice, light color stool, liver problems, rectal bleeding, and  rectal pain.    Current Medications (verified): 1)  Fish Oil 300 Mg Caps (Omega-3 Fatty Acids) .... 2 By Mouth Two Times A Day 2)  Activella 0.5-0.1 Mg Tabs (Estradiol-Norethindrone Acet) .Marland Kitchen.. 1 By Mouth Once Daily 3)  Multivitamins  Caps (Multiple Vitamin) .Marland Kitchen.. 1 By Mouth Once Daily 4)  Amitiza 8 Mcg Caps (Lubiprostone) .... Take 1 Tab Two Times A Day 5)  Bentyl 10 Mg Caps (Dicyclomine Hcl) .... Take 1 Tab Twice Daily  As Needed For Spasms and Cramping 6)  Ciprofloxacin Hcl 250 Mg  Tabs (Ciprofloxacin Hcl) .... Take 1 Twice A Day Times 7 Days. 7)  Metronidazole 250 Mg Tabs (Metronidazole) .... Take  One Three Times Daily For 7 Days.  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 Constipation  Past Surgical History: Reviewed history from 03/22/2009 and no changes required. Knee Arthroscopy Breast Biopsy Breast reduction  Family History: Parkinson- Mom Family History of Kidney Disease: father  Family History of Colon Cancer: ? MGF  Social History: Reviewed history from 03/22/2009 and no changes required. Patient has never smoked.  Alcohol Use - yes occasional Daily Caffeine Use 1 per day Illicit Drug Use - no Patient does not get regular exercise.   Vital Signs:  Patient profile:   65 year old female Height:      65 inches Weight:      155 pounds BMI:     25.89 BSA:     1.78 Pulse rate:   88 / minute Pulse rhythm:   regular BP sitting:   116 / 72  (left arm) Cuff size:   regular  Vitals Entered By: Ok Anis CMA (March 30, 2009 8:34 AM)  Physical Exam  General:  Well developed, well nourished, no acute distress. Heart:  Regular rate and rhythm; no murmurs, rubs,  or bruits. Abdomen:  Abdomen soft,  nondistended. In the RLQ there remains an area of fullness with mild tenderness..Normal bowel sounds.  Psych:  Alert and cooperative. Normal mood and affect.   Impression & Recommendations:  Problem # 1:  CONSTIPATION (ICD-564.00) Assessment Improved Successful bowel purge but stools now  of small caliber. Continue Amitiza and daily Miralax. Hold Miralax for diarrhea.  Orders: CT Abdomen/Pelvis with Contrast (CT Abd/Pelvis w/con) TLB-CBC Platelet - w/Differential (85025-CBCD) TLB-CMP (Comprehensive Metabolic Pnl) (80053-COMP)  Problem # 2:  ABNORMAL FINDINGS GI TRACT (ICD-793.4) Assessment: Unchanged Brought patient back to clinic  today so I could reassess her RLQ after bowel purge. There is still fullness in the RLQ. I think we should proceed with CTscan for evaluation of  worsening constipation, abdominal pain and abnormal findings on exam. Will check renal function prior to contrast.   Patient Instructions: 1)  Please go to the lab , basement level. 2)  Contiune the Miralax and Amitiza. 3)  If you get Diarrhea, stop the Miralax. 4)  We have scheduled the CT of the Abd and Pelvis for Tues 04-03-09. 5)  Directions and contrast provided. 6)  We will contact you with the results. 7)  We will fax Theressa Millard, MD with the office note, labs and CT results. 8)  The medication list was reviewed and reconciled.  All changed / newly prescribed medications were explained.  A complete medication list was provided to the patient / caregiver.

## 2010-05-14 NOTE — Progress Notes (Signed)
Summary: GES Scheduled  Phone Note Outgoing Call   Call placed by: Laureen Ochs LPN,  June 25, 2009 9:35 AM Call placed to: Patient Summary of Call: HIDA Scan results reviewed with pt., she continues with c/o epigastric pain, nausea.  GES is scheduled for [facility] on 07-02-09 at 1pm, NPO 8 hours prior. All instructions reviewed with pt. by phone. Pt. instructed to call back as needed.  Initial call taken by: Laureen Ochs LPN,  June 25, 2009 9:40 AM  Follow-up for Phone Call        reviewed and agree Follow-up by: Hart Carwin MD,  June 25, 2009 9:41 PM

## 2010-05-16 NOTE — Assessment & Plan Note (Signed)
Summary: NP,LOWER BACK PAIN,MC   Vital Signs:  Patient profile:   65 year old female Height:      65.5 inches Weight:      138 pounds BMI:     22.70 Pulse rate:   76 / minute BP sitting:   117 / 76  (left arm)  Vitals Entered By: Rochele Pages RN (May 07, 2010 9:10 AM) CC: buttocks pain, low back pain, L knee giving out    Referring Provider:  n/a Primary Provider:  Theressa Millard MD  CC:  buttocks pain, low back pain, and L knee giving out .  History of Present Illness: started with 2 long car trips 6 hours.  first hd stiffness in buttocks and felt weak down legs to knees.  both sides eqaul.  no numbness or tingling.  just very stiff.  worse in teh am.  takes 1 hour to get better.  last couple of days now has lower back stiffness as well.   usually do pilates and golf and tennis, but not very active.    has tried heat,  stretching, using a ball for myofascial release, not much benefit.  not taking much tylenol or advil.  no traumas.  last dexa scan was good.  walking usually makes it feel better.  no fevers, sweats.  changed diet to gluten free and has lost 20 lbs in last yer.  more stress in past 6 mos planning for a wedding  Preventive Screening-Counseling & Management  Alcohol-Tobacco     Smoking Status: quit  Current Medications (verified): 1)  Fish Oil 300 Mg Caps (Omega-3 Fatty Acids) .... 2 By Mouth Two Times A Day 2)  Activella 0.5-0.1 Mg Tabs (Estradiol-Norethindrone Acet) .Marland Kitchen.. 1 By Mouth Once Daily 3)  Multivitamins  Caps (Multiple Vitamin) .Marland Kitchen.. 1 By Mouth Once Daily 4)  Amitiza 8 Mcg Caps (Lubiprostone) .... Take 1 Tab Two Times A Day 5)  Bentyl 10 Mg Caps (Dicyclomine Hcl) .... Take 1 Tab Twice Daily As Needed For Spasms and Cramping 6)  Miralax  Powd (Polyethylene Glycol 3350) .... Mix One Scoop in Water Once A Day 7)  Paxil 20 Mg Tabs (Paroxetine Hcl) .... Take 1/2 Tablet By Mouth At Bedtime 8)  Xifaxan 550 Mg Tabs (Rifaximin) .... Take 1 Tablet By Mouth  Twice A Day X 7 Days. 9)  Ultram 50 Mg Tabs (Tramadol Hcl) .... Take Two Times A Day May Take Up To Qid  Allergies (verified): No Known Drug Allergies  Social History: Smoking Status:  quit  Review of Systems  The patient denies fever, chest pain, and headaches.    Physical Exam  General:  Well-developed,well-nourished,in no acute distress; alert,appropriate and cooperative throughout examination Msk:  Knee: Normal to inspection with no erythema or effusion or obvious bony abnormalities. Palpation normal with no warmth or joint line tenderness or patellar tenderness or condyle tenderness. ROM normal in flexion and extension and lower leg rotation. Ligaments with solid consistent endpoints including ACL, PCL, LCL, MCL. Negative Mcmurray's and provocative meniscal tests. Non painful patellar compression. Patellar and quadriceps tendons unremarkable. Hamstring and quadriceps strength is normal.  Hip: straight leg raise neg nl FABER, nl extension and flexion Greater trochanter without tenderness to palpation. slight tenderness over piriformis and over L5.  No SI joint tenderness and normal minimal SI movement. 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  will review x rays and try to confirm this dx.  pt should continue walking, tennis, and start low back exercises at least daily.  start tramadol 50mg  two times a day.  pt to return in 1 month.    work with Denice Paradise to develop a good HEP  I think the added stress may worsen LBP Not likely to improve unless she can get more consistent home exercise and aerobic exercise program restarted  give a trial of tramadil  Problem # 2:  BACK PAIN, LUMBAR (ICD-724.2)  Her updated medication list for this problem includes:    Ultram 50 Mg  Tabs (Tramadol hcl) .Marland Kitchen... Take two times a day may take up to qid  encourage to be consistent with this for 1 month and track how long stiffness persists  Complete Medication List: 1)  Fish Oil 300 Mg Caps (Omega-3 fatty acids) .... 2 by mouth two times a day 2)  Activella 0.5-0.1 Mg Tabs (Estradiol-norethindrone acet) .Marland Kitchen.. 1 by mouth once daily 3)  Multivitamins Caps (Multiple vitamin) .Marland Kitchen.. 1 by mouth once daily 4)  Amitiza 8 Mcg Caps (Lubiprostone) .... Take 1 tab two times a day 5)  Bentyl 10 Mg Caps (Dicyclomine hcl) .... Take 1 tab twice daily as needed for spasms and cramping 6)  Miralax Powd (Polyethylene glycol 3350) .... Mix one scoop in water once a day 7)  Paxil 20 Mg Tabs (Paroxetine hcl) .... Take 1/2 tablet by mouth at bedtime 8)  Xifaxan 550 Mg Tabs (Rifaximin) .... Take 1 tablet by mouth twice a day x 7 days. 9)  Ultram 50 Mg Tabs (Tramadol hcl) .... Take two times a day may take up to qid  Patient Instructions: 1)  try the tramadol two times a day  and up to four times a day as needed  2)  Bring the xrays by for review 3)  do the back exercise at least once a day 4)  come back in one month for recheck Prescriptions: ULTRAM 50 MG TABS (TRAMADOL HCL) take two times a day may take up to QID  #100 x 1   Entered by:   Rochele Pages RN   Authorized by:   Enid Baas MD   Signed by:   Rochele Pages RN on 05/07/2010   Method used:   Electronically to        [facility]. [phone]* (retail)       [address]       [place], [place]  [address]       Ph: [phone]       Fax: [phone]   RxID:   [id number]    Orders Added: 1)  New Patient Level II [59563]

## 2010-06-06 ENCOUNTER — Ambulatory Visit (INDEPENDENT_AMBULATORY_CARE_PROVIDER_SITE_OTHER): Payer: BC Managed Care – PPO | Admitting: Sports Medicine

## 2010-06-06 ENCOUNTER — Encounter: Payer: Self-pay | Admitting: Sports Medicine

## 2010-06-06 DIAGNOSIS — M545 Low back pain, unspecified: Secondary | ICD-10-CM

## 2010-06-06 DIAGNOSIS — M5137 Other intervertebral disc degeneration, lumbosacral region: Secondary | ICD-10-CM

## 2010-06-11 NOTE — Assessment & Plan Note (Addendum)
Summary: f/u,mc   Vital Signs:  Patient profile:   65 year old female Pulse rate:   77 / minute BP sitting:   118 / 77  (right arm)  Vitals Entered By: Rochele Pages RN (June 06, 2010 11:14 AM) CC: f/u low back pain - 20% improved   Referring Provider:  n/a Primary Provider:  Theressa Millard MD  CC:  f/u low back pain - 20% improved.  History of Present Illness: 65yo female to office for f/u low back pain.  Pain 20% improved since last visit.  Pain primarily in right lower back, denies radiation into lower extremities.  Denies numbness/tingling, denies lower extremity weakness, denies changes in bowel/bladder.  Still experiencing stiffness in low back in the AM.  Has been doing HEP about once a day, also been going to [facility] for dry needling & other therapy.  Taking tramadol bid without much improvement.  Not taking any other medicaitons.  Continues to do yoga & pilates.  Still under large amount of stress related to planning her daughter's wedding.  Still do not have CD of lumbar spine x-rays for review, pt thinks her doctor sent them 2-3 weeks ago.  No longer having pain or symptoms in left knee.  Is having some occasional R knee pain with lunges.  Pain is along medial aspect of the knee, feels sharp.  Denies any clicking, catching, locking, instability.  Only occurs with lunges or deep squat.  No swelling.  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 Constipation  Past Surgical History: Last updated: 03/22/2009 Knee Arthroscopy Breast Biopsy Breast reduction  Social History: Smoking Status:  never  Review of Systems       per HPI  Physical Exam  General:  Well-developed,well-nourished,in no acute distress; alert,appropriate and cooperative throughout examination Msk:  KNEES:   FROM b/l without pain, mild crepitation on the right.  L knee mildly TTP along medial patella & medial joint line.  No swelling or effusions.  Neg McMurray, no ligamentous laxity.  Neg Patellar apprehension & patellar grind.  R know without and pain, tenderness, swelling, weakness, or instability.  HIPS:  FROM without pain, neg log roll.  Tight FABER on the right.  Mild weakness on right with abduction, otherwise good strength b/l.  BACK: Good ROM without pain, no scoliosis or deformity.  No midline TTP, mily TTP along SI-joints b/l & paraspinal muscles @ L4-5 b/l.  Mild TTP over piriformis on the right, none on the left. Tight FABER on right, Good SIJ mobility.  Neg SLR.  Good lower ext strength.  Able to toe walk & heel walk. Pulses:  +2/4 DP & PT Neurologic:  DTR +2/4 achilles & patella b/l   Impression & Recommendations:  Problem # 1:  BACK PAIN, LUMBAR (ICD-724.2) Assessment Improved - LBP - slightly improved today, was noted to have some tightness in her right piriformis.  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  Again re-emphasized not likely to improve unless she can get more consistent with home exercises  - f/u 4-6  weeks  Her updated medication list for this problem includes:    Ultram 50 Mg Tabs (Tramadol hcl) .Marland Kitchen... Take two times a day may take up to qid  Problem # 2:  DEGENERATIVE DISC DISEASE, LUMBAR SPINE (ICD-722.52) Assessment: Unchanged - Suspect this is contributing to her back pain.  Await x-rays from different provider for review to confirm diagnosis.  Remainder of plan as outline above  Complete Medication List: 1)  Fish Oil 300 Mg Caps (Omega-3 fatty acids) .... 2 by mouth two times a  day 2)  Activella 0.5-0.1 Mg Tabs (Estradiol-norethindrone acet) .Marland Kitchen.. 1 by mouth once daily 3)  Multivitamins Caps (Multiple vitamin) .Marland Kitchen.. 1 by mouth once daily 4)  Amitiza 8 Mcg Caps (Lubiprostone) .... Take 1 tab two times a day 5)  Bentyl 10 Mg Caps (Dicyclomine hcl) .... Take 1 tab twice daily as needed for spasms and cramping 6)  Miralax Powd (Polyethylene glycol 3350) .... Mix one scoop in water once a day 7)  Paxil 20 Mg Tabs (Paroxetine hcl) .... Take 1/2 tablet by mouth at bedtime 8)  Xifaxan 550 Mg Tabs (Rifaximin) .... Take 1 tablet by mouth twice a day x 7 days. 9)  Ultram 50 Mg Tabs (Tramadol hcl) .... Take two times a day may take up to qid  Patient Instructions: 1)  Increase your tramadol to 1 tab 3-times a day. 2)  Do exercises for your hip: 3)  - Hip abduction & adduction exercises - 3 sets of 30 without weight 4)  - Standing hip rotations - 3 sets of 3 without weight 5)  - Do these exercises at least 1-2 times daily. 6)  Do Stretches for your back/piriformis as circled on the handout - do these at least twice a day. 7)  Follow-up in 4-6 weeks  Appended Document: Orders Update    Clinical Lists Changes  Orders: Added new Service order of Est. Patient Level III (04540) - Signed

## 2010-07-24 LAB — POCT HEMOGLOBIN-HEMACUE: Hemoglobin: 14.9 g/dL (ref 12.0–15.0)

## 2010-08-26 ENCOUNTER — Encounter: Payer: Self-pay | Admitting: Family Medicine

## 2010-08-26 ENCOUNTER — Ambulatory Visit (INDEPENDENT_AMBULATORY_CARE_PROVIDER_SITE_OTHER): Payer: BC Managed Care – PPO | Admitting: Family Medicine

## 2010-08-26 VITALS — BP 128/85 | HR 73 | Ht 65.5 in | Wt 138.0 lb

## 2010-08-26 DIAGNOSIS — M7070 Other bursitis of hip, unspecified hip: Secondary | ICD-10-CM

## 2010-08-26 DIAGNOSIS — M76899 Other specified enthesopathies of unspecified lower limb, excluding foot: Secondary | ICD-10-CM

## 2010-08-27 NOTE — Letter (Signed)
February 23, 2008   Hedwig Morton. Juanda Chance, MD  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  Unfortunately, on the  CT scan, they saw another small 4.2 nodule that was not seen on her  previous scan within the right hilar region.  Because of that they are  recommending that we get another CT scan in 1 year.  I have usually been  following her in 2 years, but since we have a new finding, we will go  along with her recommendation and do it in another year.  The patient  was concerned about the radiation and I agreed with her that this is not  what I would like, but I think checking one more scan in 1 year,  hopefully, will take care of it.  The other nodules are unchanged.  Blood pressure was 106/70, pulse 80, respirations 18, and sats were 97%.   Sherry [name], M.D.  Electronically Signed   DPB/MEDQ  D:  02/23/2008  T:  02/23/2008  Job:  [id number]   cc:   Theressa Millard, M.D.

## 2010-08-27 NOTE — Op Note (Signed)
NAMEFAREEDAH, MAHLER              ACCOUNT NO.:  [id number]   MEDICAL RECORD NO.:  [id number]          PATIENT TYPE:  AMB   LOCATION:  DSC                          FACILITY:  [facility]   PHYSICIAN:  Katy Fitch. Sypher, M.D. 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   OPERATION:  1. Debridement of a complex mucoid cyst from the dorsoradial nail      fold, left long finger.  2. Distal interphalangeal radial and ulnar arthrotomy with synovectomy      and removal of adjacent osteophytes at the base of distal phalanx      and middle phalangeal head and irrigation with a 19-gauge dental      needle to remove loose cartilaginous fragments.   OPERATIONS:  Katy Fitch. Sypher, MD   ASSISTANT:  None.   ANESTHESIA:  2% lidocaine metacarpal head level block of left long  finger, supplemented by IV sedation.   SUPERVISING ANESTHESIOLOGIST:  Zenon Mayo, MD.   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  She had a chronic nail groove.  She sought a  dermatologic consult, was advised to see a  hand surgeon for DIP joint  debridement and cyst debridement.   Examination of Ms. Beaver's finger in the office revealed a full-length  nail groove with significant nail dystrophy.  It appeared that this had  been a very chronic predicament.   Plain x-rays of the left long finger, AP, lateral, and oblique  documented large marginal osteophytes, incomplete loss of the hyaline  articular cartilage surface of the distal and middle phalanges.   We advised to consider mucoid cyst debridement to prevent septic  arthritis and to try to relieve the nail deformity.  We pointed out that  we could not alter the natural history of the osteoarthritis.  Typically, joint debridement with removal of loose bodies, synovectomy,  and irrigation can decrease the mucoid cyst formation and with  debridement of the current cyst there is a significant chance that the  nail deformity will improve overtime.   We advised debridement under local anesthesia.   There will still be arthritis after surgery and still may be arthritis  pain after surgery.   PROCEDURE:  Natayla Cadenhead was brought to the operating room and placed  in the supine position upon  the operating table.   Following light sedation, the left arm was prepped with Betadine soap  and solution, sterilely draped.  A 2% lidocaine metacarpal head level  block was placed with a total volume of 4 mL of plain lidocaine, 2%  concentration.   After 5 minutes, excellent anesthesia of the left long finger was  achieved.   Following routine sterile draping, the left long finger was  exsanguinated with a gauze wrap and a 0.25-inch Penrose drain was used  as a digital tourniquet at the base of the finger over the proximal  phalangeal segment.   The dorsoradial and dorsal ulnar incisions were fashioned exposing the  extensor mechanism and the dorsoradial incision was extended to  incorporate the mucoid cyst.   An inspissated mucoid cyst was debrided  with a microcurette and great  care was taken to try to identify a sinus tract to the joint.  The  capsule between the terminal extensor tendon slip of the radial  collateral ligament was excised on the dorsoradial aspect of the joint  and dorsoulnar aspect of the joint.  With great care, the marginal  osteophytes at the base of the distal phalanx and the middle phalangeal  head were removed a micro-rongeur and microcurette.  A complete  synovectomy on the dorsal aspect of the joint was accomplished.  The  joint was irrigated with sterile saline utilizing a 19-gauge blunt  dental needle.  Great care was taken to try to identify the sinus tract  that emanated from the joint.   I could not see any evidence of a sinus tract on the ulnar aspect of the  joint, but did find one on the radial aspect.  This did not appear to  penetrate the extensor.   After complete debridement, the wounds were repaired with horizontal  mattress sutures of 5-0 nylon.   The finger was dressed with Xeroflo and sterile gauze followed by  release of the tourniquet.  Compression was held for 5 minutes to obtain  hemostasis followed by dressing of the finger with Coban and sterile  gauze.   Ms. Engh was advised to elevate her hand for 48 hours.  She will  return to the office for followup in 4-5 days for dressing change.  We  will anticipate suture removal in approximately 10 days.   For aftercare, she is provided doxycycline 100 mg p.o. b.i.d. x5 days as  prophylactic antibiotic due to joint entry and osseous debridement.  Also, she is provided Percocet 5 mg 1 p.o. q.4-6 hour p.r.n. pain, 20  tablets without refill.  She may use Aleve, Advil, or other nonsteroidal  medication in addition to the Percocet.      Katy Fitch Sypher, M.D.  Electronically Signed     RVS/MEDQ  D:  07/21/2008  T:  07/22/2008  Job:  [id number]   cc:   Theressa Millard, M.D.

## 2010-08-27 NOTE — Letter (Signed)
February 28, 2009   Hedwig Morton. Juanda Chance, MD  [address]  [place], [place] [address]   Re:  Sherry [name], Sherry [name]              DOB:  [birth year]   Dear Verlee Monte,   I saw the patient in the office today for followup of her nodule in the  right lower lobe.  We went ahead and check another CT scan on her, and  it shows that this nodule in the right lower lobe was down to 3.1 cm.  Her blood pressure is 125/81, pulse 70, respirations 18, sats were 98%.  The patient had several biopsies and several CT scans and this is all  shown to be unchanged.  I  think these are probably just inflammatory  nodules, and I will refer her back to Dr. Earl Gala and you for any  longterm followup.   I appreciate the opportunity of seeing the patient.    Sincerely,   Ines Bloomer, M.D.  Electronically Signed   DPB/MEDQ  D:  02/28/2009  T:  03/01/2009  Job:  [id number]

## 2010-08-27 NOTE — Progress Notes (Signed)
  Subjective:    Patient ID: Sherry [name], female    DOB: 1945-07-05, 65 y.o.   MRN: [id number]  HPI  Chief complaint buttock pain Buttock/posterior hip pain it is bilateral but much worse on the left. This has been going on for about 10 months. 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 She is very active in multiple sports including tennis but she is not a cyclist and has not been on a bike for quite a while.  Pain is worst in the morning and sometimes it seems to be associated with generalized stiffness in her hips and low back. When she gets up and moves around the stiffness is much better but there seems to be a specific area of pain in her left buttock it never gets much better and gets worse if she does a lot of sitting. There is no radiation into her leg. She's had no incontinence. Review of Systems Denies fever, no unusual fatigue, no fever sweats or chills. No numbness in her legs.    Objective:   Physical Exam     GENERAL: Well-developed female no acute distress HIP: Intact full range of motion bilaterally she is quite flexible. I cannot reproduce her pain by hip motion. Pearlean Brownie is negative for pain. She is tender to palpation over the left ischial tuberosity area. She is nontender to palpation on the right at this time. Normal hamstring strength. No tenderness to palpation at the origin of hamstring.  ULTRASOUND: On the left issue area is a small well-defined bursa. I do not see one on the right. Pictures are safe for documentation. The ultrasound was also used as a guide for injection of this bursa.  INJECTION: Patient was given informed consent, signed copy in the chart. Appropriate time out was taken. Area prepped and draped in usual sterile fashion. 1 cc of kenalog plus  1 cc of lidocaine was injected into the left ischial bursa using ultrasound as a guide using a(n) posterior  approach. The patient tolerated the procedure well. There were no complications. Post procedure instructions were given.            Assessment & Plan:  Ischial bursitis. Injection as above. She will followup when necessary.

## 2010-08-27 NOTE — Letter (Signed)
May 27, 2007   Sherry [name], M.D.  [address] [place], [place] [address]   Re:  Sherry [name], Sherry [name]              DOB:  Nov 15, 1945   Dear Sherry [name]:   I saw the patient back for followup today.  She is doing well.  We  repeated her CT scan and it showed no change in her pulmonary nodules.  It has now been over 18 months since we have been following her, and I  feel we should check it at least one more time, so we scheduled another  one in 9 months.  This would all point to these nodules of a benign  etiology, since it will be over 2 years when we get the next scan.   Her blood pressure was 126/84, pulse 65, respirations 18, sats were 98%.  Lungs were clear to auscultation and percussion.   Sherry [name], M.D.  Electronically Signed   DPB/MEDQ  D:  05/27/2007  T:  05/28/2007  Job:  [id number]

## 2010-08-30 NOTE — Assessment & Plan Note (Signed)
[facility]                           GASTROENTEROLOGY OFFICE NOTE   Regan, Mcbryar CLORIS FLIPPO                     MRN:          [id number]  DATE:12/22/2005                            DOB:          Dec 01, 1945    Tarryn is a 65 year old white female with dyspepsia.  We saw her in May of  this year for screening coloscopy and surveillance colonoscopy which were  essentially normal.  She has a family history of colon cancer.  Her upper  endoscopy showed 3 cm hiatal hernia without endoscopic evidence of  esophagitis.  She has continued to take Prevacid 30 mg a day.  Biopsies of the gastroesophageal junction showed only some mild inflammation  but no intestinal metaplasia.  She uses full antireflux measures and low fat  diet but she has continued to be dyspeptic, mostly post prandially.  Her  weight remains stable.  She does not take any NSAIDs, does not drink excess  alcohol and does not smoke.  There is no family history of gallbladder  disease.   MEDICATIONS:  1. Prevacid solutab 30 mg q. day.  2. Activella daily.   PHYSICAL EXAMINATION:  VITAL SIGNS:  Blood pressure 130/70.  Pulse 68.  Weight 159 pounds.  GENERAL:  She was alert, oriented and in no distress.  LUNGS:  Lung fields clear to auscultation.  HEART:  Normal S1, S2.  ABDOMEN:  Soft with minimal discomfort in the subxyphoid area.  The rest of  the abdomen was unremarkable.  RECTAL:  Not repeated.  EXTREMITIES:  No edema.   CT of the abdomen done in May of this year showed two long nodules which  since then have been followed by Dr. Earl Gala as well as by Dr. Dewayne Shorter.  As far is the gastrointestinal tract is concerned a gallbladder, pancreas,  liver and pelvic organs were normal.   IMPRESSION:  77. A 65 year old white female with dyspepsia.  Rule out gallbladder      dysfunction.  2. Gastroesophageal reflux disease on Prevacid 30 mg a day.  3. Lung nodules being followed by Dr. Edwyna Shell  and Dr. Earl Gala.   PLAN:  1. Ultrasound of the gallbladder.  2. Add Pepcid 40 mg at bedtime.  3. Continue anti-reflux measures.                                   Hedwig Morton. Juanda Chance, MD   DMB/MedQ  DD:  12/22/2005  DT:  12/22/2005  Job #:  [id number]   cc:   Theressa Millard, M.D.

## 2010-09-06 ENCOUNTER — Encounter: Payer: Self-pay | Admitting: Family Medicine

## 2010-09-06 ENCOUNTER — Ambulatory Visit (INDEPENDENT_AMBULATORY_CARE_PROVIDER_SITE_OTHER): Payer: BC Managed Care – PPO | Admitting: Family Medicine

## 2010-09-06 DIAGNOSIS — M545 Low back pain, unspecified: Secondary | ICD-10-CM

## 2010-09-06 DIAGNOSIS — M5137 Other intervertebral disc degeneration, lumbosacral region: Secondary | ICD-10-CM

## 2010-09-06 NOTE — Progress Notes (Signed)
  Subjective:    Patient ID: Sherry [name], female    DOB: 07-01-1945, 65 y.o.   MRN: [id number]  HPI  F/u ischial bursitis and generalized stiffness in low back  Injection seemed to really help for a while, then started to hurt again---now about 50% better than originally. Am stiffness in posterior pelvis / low back. No radiation down legs. No incontinence or leg weakness. AM  Stiffness in low back and SI joint area.  Review of Systems Pertinent review of systems: negative for fever or unusual weight change.    Objective:   Physical Exam     BACK nontender to palpation and percussion T 12 to S1. No muscle spasm in paravertebral muscles. Flexion at hips limited some bu hamstring weakness, can put fingertips within 10 inches of floor. Hyperextension at hips is moderately painful in SI joint area.  Lower extremities B normal strength.  Gait is normal. Si joints mildy TTP esp on right   Assessment & Plan:  1) ischial bursitis seems better. The pain she is describing today sees more consistent with Si joint dysfunction, as well as some general low back stiffness. We spent a long discussion about options. I referred her to Dr Sherlon Handing for some chiropractic evaluation / manipulation. Gave her low back exercise handout and explained--focus on hyperextension exercises mostly. She will f/u  3-5 weeks. I would consider another injection into her ischial bursa again, but I think we need to take care of  This other confounding problem--it may be the greater issue anyway.

## 2010-10-11 ENCOUNTER — Ambulatory Visit: Payer: BC Managed Care – PPO | Admitting: Family Medicine

## 2010-11-18 ENCOUNTER — Ambulatory Visit (INDEPENDENT_AMBULATORY_CARE_PROVIDER_SITE_OTHER): Payer: BC Managed Care – PPO | Admitting: Sports Medicine

## 2010-11-18 ENCOUNTER — Encounter: Payer: Self-pay | Admitting: Sports Medicine

## 2010-11-18 VITALS — BP 144/93 | HR 82

## 2010-11-18 DIAGNOSIS — M545 Low back pain, unspecified: Secondary | ICD-10-CM

## 2010-11-18 DIAGNOSIS — M5137 Other intervertebral disc degeneration, lumbosacral region: Secondary | ICD-10-CM

## 2010-11-18 NOTE — Assessment & Plan Note (Signed)
I think most of her ongoing symptoms in her hips and pelvis area are secondary to lumbar spine disease  Because of the persistent nature of the symptoms and radiation into her hips and upper leg I think we should go ahead and check an MRI  If this is suggestive of [place] of disease then she may be a good candidate for a treatment course with gabapentin and probably continue the tramadol to see if we can get better control of her radiating symptoms  We will bring her back for more definitive treatment at the conclusion of her MRI

## 2010-11-18 NOTE — Assessment & Plan Note (Signed)
Continue with tramadol as needed for pain  Continue with walking and Pilates both of which seemed to lessen her pain

## 2010-11-18 NOTE — Progress Notes (Signed)
  Subjective:    Patient ID: Sherry [name], female    DOB: December 27, 1945, 65 y.o.   MRN: [id number]  HPI  Pt presents to clinic for f/u of bilateral buttock/posterior hip pain and lower back pain that she feels has worsening since last visit.  Had a lt bursal inj at last visit which she states was not helpful.  Still has pain first thing in the mornings, has to stand in flexion- which improves with activity.  With sitting has mid sacral/ coccygeal area pain.  Recently had significant flare after weeding in the yard.  Walking and pilates do help with pain.  Not consistent with PT exercises, states they caused pain and were not helpful.  When I first evaluated her last January I felt it most of the pain was probably from degenerative lumbar spine disease. More recently we have focused the muscles around the hip which were also painful but I wonder if these are not a secondary problem. She did do enough of the exercises to her she is quite strong around the hip and she also had an injection in either one of these approaches has given her much relief.  I would note that on a pelvic CT done in 2010 she had some significant lumbosacral facet joint disease  Review of Systems     Objective:   Physical Exam Pleasant and in no acute distress  Leg lengths equal Left hemipelvis shifted forward on left in lying position Neither SI joint moves freely FABER neg on rt, tight on left Negative straight leg raise Hip rotation normal bilaterally Sore on rt greater trochanter with palpation Tenderness to palpation over SI joints rather than piriformis   Good abduction and rotation strength bilat Good HS strength bilat Tight on HS on forward flexion, can go all the way to ground Excellent back extension Good rt and lt lateral bend, and rotation        Assessment & Plan:

## 2010-11-19 ENCOUNTER — Ambulatory Visit
Admission: RE | Admit: 2010-11-19 | Discharge: 2010-11-19 | Disposition: A | Discharge status: Home / Self Care | Payer: BC Managed Care – PPO | Source: Ambulatory Visit | Attending: Sports Medicine | Admitting: Sports Medicine

## 2010-11-19 DIAGNOSIS — M545 Low back pain, unspecified: Secondary | ICD-10-CM

## 2010-11-25 ENCOUNTER — Ambulatory Visit (INDEPENDENT_AMBULATORY_CARE_PROVIDER_SITE_OTHER): Payer: BC Managed Care – PPO | Admitting: Sports Medicine

## 2010-11-25 VITALS — BP 130/73

## 2010-11-25 DIAGNOSIS — M5137 Other intervertebral disc degeneration, lumbosacral region: Secondary | ICD-10-CM

## 2010-11-25 DIAGNOSIS — M51379 Other intervertebral disc degeneration, lumbosacral region without mention of lumbar back pain or lower extremity pain: Secondary | ICD-10-CM

## 2010-11-25 DIAGNOSIS — M545 Low back pain, unspecified: Secondary | ICD-10-CM

## 2010-11-25 DIAGNOSIS — M4800 Spinal stenosis, site unspecified: Secondary | ICD-10-CM

## 2010-11-25 MED ORDER — AMITRIPTYLINE HCL 25 MG PO TABS: 25.0000 mg | ORAL_TABLET | Freq: Every day | ORAL | Status: DC

## 2010-11-25 MED ORDER — TRAMADOL HCL 50 MG PO TABS: ORAL_TABLET | ORAL | Status: DC

## 2010-11-25 NOTE — Progress Notes (Signed)
  Subjective:    Patient ID: Sherry [name], female    DOB: 10-Oct-1945, 65 y.o.   MRN: [id number]  HPI  Patient returns today to go over MRI results and come up with a treatment plan to help her deal with persistent buttocks pain that goes into her upper thighs which is bilateral. We tried a number of home exercises and these have not helped. In the past she had a session with Ellamae Sia been physical therapy and that was helpful.  Currently her pain bothers her most with certain activities. Driving is particularly bad. Golf and tennis tend to aggravate her back. Pilates and walking have been good. Zumba classes have caused her to have pain. 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 In addition there is both osteophytic spurring and degenerative disc herniation that causes impingement of nerve roots at multiple levels but probably most severe at L5-S1. The degenerative disc and arthritic change lead to spinal stenosis at different levels in the lower lumbar spine.  These images were reviewed with the patient. We did note that some of the more significant changes were on the right side but her symptoms were worse on the left.      Assessment & Plan:

## 2010-11-25 NOTE — Assessment & Plan Note (Signed)
She is given a home exercise program to begin.   We will also start her on amitriptyline just at night and try this for 30 days. She does not like to take medication but I did encourage her to use some tramadol as needed for pain  We will refer her to Ell[facility]e Sia to try some formal physical therapy to see if he can improve her activities of daily living and her ability to return to sport.  Faxed copy of last 2 visits to PT  Recheck in one month

## 2010-11-25 NOTE — Patient Instructions (Signed)
Let's try using amitriptyline 25 at night time This may take several days before you are used to how you feel in mornings Side effects are drowsiness and dry mouth primarily  For pain use some tramadol 50 - just as needed - take 1 every 6 hours if you had a bad day  Activity wise -   Most important is to keep good back motion - pilates is good for that Work you abdominal muscles  Days not doing pilates Knee to chest Knee to opp shoulder Grasp both knees and rock All of above - do 5 x 5 breaths No hyperextension  Regular situps and crunches - do 8 to 10 in different positions  Pelvic tilt is - belly button to spine while lying - 5 x 5 breaths  Aerobic activity Walking should be good If biking consider recumbent may be better than upright

## 2010-12-18 ENCOUNTER — Other Ambulatory Visit: Payer: Self-pay | Admitting: Dermatology

## 2010-12-23 ENCOUNTER — Encounter: Payer: Self-pay | Admitting: Sports Medicine

## 2010-12-23 ENCOUNTER — Ambulatory Visit (INDEPENDENT_AMBULATORY_CARE_PROVIDER_SITE_OTHER): Payer: BC Managed Care – PPO | Admitting: Sports Medicine

## 2010-12-23 VITALS — BP 135/85 | HR 69 | Ht 66.0 in | Wt 142.0 lb

## 2010-12-23 DIAGNOSIS — M5137 Other intervertebral disc degeneration, lumbosacral region: Secondary | ICD-10-CM

## 2010-12-23 DIAGNOSIS — M545 Low back pain, unspecified: Secondary | ICD-10-CM

## 2010-12-23 MED ORDER — DIAZEPAM 2 MG PO TABS: ORAL_TABLET | ORAL | Status: DC

## 2010-12-23 MED ORDER — HYDROCODONE-ACETAMINOPHEN 5-500 MG PO TABS: 1.0000 | ORAL_TABLET | Freq: Four times a day (QID) | ORAL | Status: DC | PRN

## 2010-12-23 NOTE — Assessment & Plan Note (Signed)
With an acute flare and severe pain -she rates this as 8/10 now and was 10 over 10 last him  IM injection of Depo-Medrol Vicodin for pain relief Diazepam for muscle spasm and sleep  Check in one week

## 2010-12-23 NOTE — Progress Notes (Signed)
  Subjective:    Patient ID: Sherry [name], female    DOB: 1945-06-15, 65 y.o.   MRN: [id number]  HPI 65 yo female with a history of spinal spenosis presents with an acute onset of right sided back and buttock pain that radiates down her posterior leg to her achillies area. The pain occurred last night upon waking waking and was 10/10 and she could not get relief. She tried a pain medication, which my have been tramadol, which has helped minimally. She did have an increase in activity the past 2-3 days with walking up hill and golfing. There is not loss of bowel or bladder control. She was preparing to play golf tournament and last night before the pain occurred had been on the golf tee hitting a number of shots    Review of Systems     Objective:   Physical Exam There is no obvious deformity of the back.  The patient prefers to stand with trunk flexion. The back is nontender. There is tenderness of the buttock at the piriformis, sciatic notch and into her posterior thigh.  There is normal and equal dermatomes of the LE and 2+/4 reflexes of the knee, achillies, and babinski is intact. There is a negative SLR bilaterally and negative FABERs.  There is mild relief of pain with flexion of the knee and hip.  The legs are of equal length and the ASIS are level.     Assessment & Plan:  65 yo female with known spinal stenosis with acute onset of radicular symptoms. There may be an acute irritation of the sciatic nerve or lumbar nerve roots.  She will rest, and not play in the golf tournament tomorrow. She may use Vicodin for pain, Valium for sleep and a injection Depo-Medrol 40 2 mg/mL, 2 mL injection was performed in the office. She tolerated this injection.  She will perform mild stretching, flexion exercises as tolerated.

## 2010-12-23 NOTE — Assessment & Plan Note (Signed)
Considering her persistence of symptoms in spite of good physical therapy we may have to consider which activities cause most of her problems  She may be left with the option of modifying some of these  Golf may be a problem for her low back unless she gets some guidance about playing in a way that will lessen stress on her back

## 2010-12-23 NOTE — Progress Notes (Signed)
Gave 2cc of Depo-Medrol 40mg  IM upper right quadrant of gluteous. Patient tolerated well.  Depo-Medrol: Lot # [id number] /  Expiration date: 05/2013 / [employer]

## 2010-12-25 ENCOUNTER — Ambulatory Visit: Payer: BC Managed Care – PPO | Admitting: Sports Medicine

## 2010-12-25 ENCOUNTER — Encounter: Payer: Self-pay | Admitting: *Deleted

## 2010-12-25 NOTE — Progress Notes (Signed)
  Subjective:    Patient ID: Sherry [name], female    DOB: 05/24/1945, 64 y.o.   MRN: [id number]  HPI    Review of Systems     Objective:   Physical Exam        Assessment & Plan:   

## 2010-12-25 NOTE — Patient Instructions (Signed)
Pt to go and see Dr Josepha Pigg tmrrw Sept 13th at 945am for injection.  [address] [phone] Faxed records to [phone] Pt informed.

## 2010-12-30 ENCOUNTER — Encounter: Payer: Self-pay | Admitting: Sports Medicine

## 2010-12-30 ENCOUNTER — Ambulatory Visit (INDEPENDENT_AMBULATORY_CARE_PROVIDER_SITE_OTHER): Payer: BC Managed Care – PPO | Admitting: Sports Medicine

## 2010-12-30 VITALS — BP 144/88 | HR 67

## 2010-12-30 DIAGNOSIS — M5137 Other intervertebral disc degeneration, lumbosacral region: Secondary | ICD-10-CM

## 2010-12-30 DIAGNOSIS — M51379 Other intervertebral disc degeneration, lumbosacral region without mention of lumbar back pain or lower extremity pain: Secondary | ICD-10-CM

## 2010-12-30 NOTE — Assessment & Plan Note (Signed)
Pain was initially improved with ESI so this may be a viable treatment method for her.  Sx's likely returned because the patient returned to normal activity too soon.  We are modifying her activity: short walks and no heavy lifting.  She will start elavil at 25 mg and titrate up to 50 mg qhs.  She is continuing PT for which she has an appointment tomorrow.  She has 2 more ESI shots pending, next follow up for this is in one week.  The patient would like to discuss options for definitive treatment so we are placing a consultation for neurosurgery. She will follow up with [place] in 3 weeks.  At that time we will consider increasing her to a more therapeutic dose of elavil as depression is becoming a significant contributor to her pain syndrome.

## 2010-12-30 NOTE — Progress Notes (Signed)
  Subjective:    Patient ID: Sherry [name], female    DOB: Dec 02, 1945, 65 y.o.   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  At her last visit one week ago she had an injection of depo medrol for acute back pain.  She has no back pain today but continues to have the radiating pain down the leg and into the great toe.  On Friday (3 days ago) she got an ESI which improved her pain immediately.  The next day she ran errands all day and noticed a return of her original pain.  She has no bowel or bladder incontinence.  No difficulty walking.   Review of Systems     Objective:   Physical Exam General appears anxious and somewhat depressed Right LE: Patient is overall very flexible  DTR's 2+ knee and ankle Decreased strength of the right great toe No foot drop Normal sensation  Decreased strength of the hip abductors Negative straight leg raise       Assessment & Plan:

## 2010-12-30 NOTE — Patient Instructions (Addendum)
1. Restart your amitriptyline.  Take one before bedtime for one week and then start taking 2 before bed time until your follow up.  2. Take several short walks per day.  3. Stay away from heavy lifting.  4. Limit the time you spend standing.  5. Easy motion for your back is ok.  6. Mention to your physical therapist that you have starting getting steroid injections.  7. Follow up in 3 weeks.  8. [facility] will call pt with appt; she has seen doc in the practice once before and it was not [facility]. Their policy is to keep continuity and not switch docs in the practice.

## 2010-12-31 ENCOUNTER — Encounter: Payer: Self-pay | Admitting: *Deleted

## 2010-12-31 NOTE — Progress Notes (Signed)
Pt called stating she is still having pain in her foot and it feels numb and cold. Most of the pain she is having now is solely in the ankle. Per dr. Darrick Penna, she can increase the frequency of the vicodin to q4hrs and take an Amitriptyline now and one at hs. Pt agreed. Pt also says that her appt with the [facility] is on October 17th with Dr. Venetia Maxon, who she has seen previously.

## 2010-12-31 NOTE — Progress Notes (Signed)
  Subjective:    Patient ID: Sherry [name], female    DOB: [birth year], 65 y.o.   MRN: [id number]  HPI    Review of Systems     Objective:   Physical Exam        Assessment & Plan:

## 2011-01-01 ENCOUNTER — Other Ambulatory Visit: Payer: Self-pay | Admitting: *Deleted

## 2011-01-01 MED ORDER — HYDROCODONE-ACETAMINOPHEN 5-500 MG PO TABS: 1.0000 | ORAL_TABLET | ORAL | Status: DC | PRN

## 2011-01-16 ENCOUNTER — Encounter: Payer: Self-pay | Admitting: Sports Medicine

## 2011-01-16 ENCOUNTER — Ambulatory Visit (INDEPENDENT_AMBULATORY_CARE_PROVIDER_SITE_OTHER): Payer: BC Managed Care – PPO | Admitting: Sports Medicine

## 2011-01-16 VITALS — BP 124/85 | HR 79

## 2011-01-16 DIAGNOSIS — M545 Low back pain, unspecified: Secondary | ICD-10-CM

## 2011-01-16 DIAGNOSIS — M5137 Other intervertebral disc degeneration, lumbosacral region: Secondary | ICD-10-CM

## 2011-01-16 NOTE — Progress Notes (Signed)
  Subjective:    Patient ID: Sherry [name], female    DOB: 03/26/1946, 65 y.o.   MRN: [id number]  HPI  Pt presents to clinic for evaluation of bilateral hip pain which she states is about 40% improved.   States she had 2nd round of bilateral ESIs last week, and felt significant improvement.   Started having stiffness and some pain in hips again 2 days ago after sitting on a hard bench for 1 hr waiting at doctor's office. Had initial PT visit Tuesday.  Taking amitriptyline 50 mg qhs, sleeping well at least 9 hours per night.  Taking tramadol x1 daily.   She will see Dr Venetia Maxon in the next month to see if this warrants surgical intervention    Review of Systems     Objective:   Physical Exam  NAD  Neg straight leg raise- 90 degrees bilat SI joints move freely bilat, and are level Slight pain over Hamstrings with knee to chest FABER on rt is painful on rt buttocks FABER on lt is not painful Rt hip abduction strength good Rt hip rotation strength good Lt hip abduction and rotation strength good- stronger than rt Excellent forward flexion without pain Back extension painful at 10 degrees No pain with lateral bend at 30 degrees bilaterally        Assessment & Plan:

## 2011-01-16 NOTE — Patient Instructions (Signed)
Continue amitriptyline at night  Use tramadol at least twice daily  Try heel lifts  Try back support

## 2011-01-16 NOTE — Assessment & Plan Note (Signed)
She is making progress w amitriptyline and injections  Trial w back support for walking and activity  Will prob do 3rd injection  Keep tramadol bid  Keep amitriptyline at night  Reck 6 wks

## 2011-01-16 NOTE — Assessment & Plan Note (Signed)
Pain is decreased by 40% from baseline and much of day is 2/10 level  I think she may see even more change if she restarts the tramadol bid

## 2011-01-20 ENCOUNTER — Other Ambulatory Visit: Payer: Self-pay | Admitting: Sports Medicine

## 2011-02-13 HISTORY — PX: POSTERIOR FUSION LUMBAR SPINE: SUR632

## 2011-02-17 ENCOUNTER — Encounter (HOSPITAL_COMMUNITY): Payer: Self-pay | Admitting: Pharmacy Technician

## 2011-02-19 ENCOUNTER — Encounter (HOSPITAL_COMMUNITY): Payer: Self-pay

## 2011-02-19 ENCOUNTER — Encounter (HOSPITAL_COMMUNITY)
Admission: RE | Admit: 2011-02-19 | Discharge: 2011-02-19 | Disposition: A | Discharge status: Home / Self Care | Payer: BC Managed Care – PPO | Source: Ambulatory Visit | Attending: Neurosurgery | Admitting: Neurosurgery

## 2011-02-19 HISTORY — DX: Other chronic pain: G89.29

## 2011-02-19 HISTORY — DX: Dorsalgia, unspecified: M54.9

## 2011-02-19 HISTORY — DX: Dermatitis, unspecified: L30.9

## 2011-02-19 HISTORY — DX: Dislocation of jaw, unspecified side, initial encounter: S03.00XA

## 2011-02-19 HISTORY — DX: Other constipation: K59.09

## 2011-02-19 HISTORY — DX: Anxiety disorder, unspecified: F41.9

## 2011-02-19 HISTORY — DX: Irritable bowel syndrome, unspecified: K58.9

## 2011-02-19 HISTORY — DX: Major depressive disorder, single episode, unspecified: F32.9

## 2011-02-19 HISTORY — DX: Depression, unspecified: F32.A

## 2011-02-19 HISTORY — DX: Cervicalgia: M54.2

## 2011-02-19 HISTORY — DX: Diaphragmatic hernia without obstruction or gangrene: K44.9

## 2011-02-19 LAB — CBC
HCT: 42 % (ref 36.0–46.0)
Hemoglobin: 14.5 g/dL (ref 12.0–15.0)
MCH: 32.7 pg (ref 26.0–34.0)
MCHC: 34.5 g/dL (ref 30.0–36.0)
MCV: 94.6 fL (ref 78.0–100.0)
Platelets: 275 10*3/uL (ref 150–400)
RBC: 4.44 MIL/uL (ref 3.87–5.11)
RDW: 12.7 % (ref 11.5–15.5)
WBC: 6.6 10*3/uL (ref 4.0–10.5)

## 2011-02-19 LAB — BASIC METABOLIC PANEL
BUN: 22 mg/dL (ref 6–23)
CO2: 29 mEq/L (ref 19–32)
Calcium: 9.6 mg/dL (ref 8.4–10.5)
Chloride: 101 mEq/L (ref 96–112)
Creatinine, Ser: 0.66 mg/dL (ref 0.50–1.10)
GFR calc Af Amer: >90 mL/min (ref >90)
GFR calc non Af Amer: >90 mL/min (ref >90)
Glucose, Bld: 98 mg/dL (ref 70–99)
Potassium: 4.2 mEq/L (ref 3.5–5.1)
Sodium: 137 mEq/L (ref 135–145)

## 2011-02-19 LAB — TYPE AND SCREEN
ABO/RH(D): O NEG
Antibody Screen: NEGATIVE

## 2011-02-19 LAB — SURGICAL PCR SCREEN
MRSA, PCR: NEGATIVE
Staphylococcus aureus: NEGATIVE

## 2011-02-19 LAB — ABO/RH: ABO/RH(D): O NEG

## 2011-02-19 NOTE — Pre-Procedure Instructions (Signed)
20 Sherry [name]  02/19/2011   Your procedure is scheduled on:  Fri,Nov 16th at 1000  Report to [facility] at 0700AM.  Call this number if you have problems the morning of surgery: [phone]   Remember:   Do not eat food:After Midnight.  Do not drink clear liquids: 4 Hours before arrival.  Take these medicines the morning of surgery with A SIP OF WATER: Estradiol,pain pill(if needed),Tramadol,eye drops,and Nasocort   Do not wear jewelry, make-up or nail polish.  Do not wear lotions, powders, or perfumes. You may wear deodorant.  Do not shave 48 hours prior to surgery.  Do not bring valuables to the hospital.  Contacts, dentures or bridgework may not be worn into surgery.  Leave suitcase in the car. After surgery it may be brought to your room.  For patients admitted to the hospital, checkout time is 11:00 AM the day of discharge.   Patients discharged the day of surgery will not be allowed to drive home.  Name and phone number of your driver:   Special Instructions: CHG Shower Use Special Wash: 1/2 bottle night before surgery and 1/2 bottle morning of surgery.   Please read over the following fact sheets that you were given: Pain Booklet, Coughing and Deep Breathing, Blood Transfusion Information, MRSA Information and Surgical Site Infection Prevention

## 2011-02-27 ENCOUNTER — Ambulatory Visit: Payer: BC Managed Care – PPO | Admitting: Sports Medicine

## 2011-02-27 MED ORDER — CEFAZOLIN SODIUM-DEXTROSE 2-3 GM-% IV SOLR
2.0000 g | INTRAVENOUS | Status: AC
  Administered 2011-02-28: 2 g via INTRAVENOUS
  Filled 2011-02-27: qty 50

## 2011-02-28 ENCOUNTER — Inpatient Hospital Stay (HOSPITAL_COMMUNITY): Payer: BC Managed Care – PPO | Admitting: *Deleted

## 2011-02-28 ENCOUNTER — Inpatient Hospital Stay (HOSPITAL_COMMUNITY): Payer: BC Managed Care – PPO

## 2011-02-28 ENCOUNTER — Encounter (HOSPITAL_COMMUNITY): Payer: Self-pay | Admitting: *Deleted

## 2011-02-28 ENCOUNTER — Encounter (HOSPITAL_COMMUNITY)
Admission: RE | Disposition: A | Discharge status: Home / Self Care | Payer: Self-pay | Source: Ambulatory Visit | Attending: Neurosurgery

## 2011-02-28 ENCOUNTER — Inpatient Hospital Stay (HOSPITAL_COMMUNITY)
Admission: RE | Admit: 2011-02-28 | Discharge: 2011-03-04 | DRG: 756 | Disposition: A | Discharge status: Home / Self Care | Payer: BC Managed Care – PPO | Source: Ambulatory Visit | Attending: Neurosurgery | Admitting: Neurosurgery

## 2011-02-28 DIAGNOSIS — Z860100 Personal history of colon polyps, unspecified: Secondary | ICD-10-CM

## 2011-02-28 DIAGNOSIS — M47817 Spondylosis without myelopathy or radiculopathy, lumbosacral region: Secondary | ICD-10-CM | POA: Diagnosis present

## 2011-02-28 DIAGNOSIS — K219 Gastro-esophageal reflux disease without esophagitis: Secondary | ICD-10-CM | POA: Diagnosis present

## 2011-02-28 DIAGNOSIS — M48061 Spinal stenosis, lumbar region without neurogenic claudication: Principal | ICD-10-CM | POA: Diagnosis present

## 2011-02-28 DIAGNOSIS — M4310 Spondylolisthesis, site unspecified: Secondary | ICD-10-CM | POA: Diagnosis present

## 2011-02-28 DIAGNOSIS — K5900 Constipation, unspecified: Secondary | ICD-10-CM | POA: Diagnosis present

## 2011-02-28 SURGERY — POSTERIOR LUMBAR FUSION 2 LEVEL
Anesthesia: General | Site: Back | Laterality: Bilateral

## 2011-02-28 MED ORDER — ONDANSETRON HCL 4 MG/2ML IJ SOLN: 4.0000 mg | Freq: Four times a day (QID) | INTRAMUSCULAR | Status: DC | PRN

## 2011-02-28 MED ORDER — LIDOCAINE-EPINEPHRINE (PF) 1 %-1:200000 IJ SOLN
INTRAMUSCULAR | Status: DC | PRN
  Administered 2011-02-28: 14:00:00 via INTRAMUSCULAR

## 2011-02-28 MED ORDER — THROMBIN 20000 UNITS EX KIT
PACK | CUTANEOUS | Status: DC | PRN
  Administered 2011-02-28: 16:00:00 via TOPICAL

## 2011-02-28 MED ORDER — DOCUSATE SODIUM 100 MG PO CAPS
100.0000 mg | ORAL_CAPSULE | Freq: Two times a day (BID) | ORAL | Status: DC
  Administered 2011-02-28 – 2011-03-03 (×7): 100 mg via ORAL
  Filled 2011-02-28 (×7): qty 1

## 2011-02-28 MED ORDER — SODIUM CHLORIDE 0.9 % IJ SOLN
3.0000 mL | Freq: Two times a day (BID) | INTRAMUSCULAR | Status: DC
  Administered 2011-02-28 – 2011-03-03 (×6): 3 mL via INTRAVENOUS

## 2011-02-28 MED ORDER — HEMOSTATIC AGENTS (NO CHARGE) OPTIME
TOPICAL | Status: DC | PRN
  Administered 2011-02-28: 1 via TOPICAL

## 2011-02-28 MED ORDER — BUPIVACAINE LIPOSOME 1.3 % IJ SUSP
20.0000 mL | INTRAMUSCULAR | Status: AC
  Administered 2011-02-28: 20 mL
  Filled 2011-02-28: qty 20

## 2011-02-28 MED ORDER — LACTATED RINGERS IV SOLN
INTRAVENOUS | Status: DC | PRN
  Administered 2011-02-28 (×3): via INTRAVENOUS

## 2011-02-28 MED ORDER — MORPHINE SULFATE (PF) 1 MG/ML IV SOLN
INTRAVENOUS | Status: AC
  Administered 2011-02-28: 3 mg via INTRAVENOUS
  Administered 2011-03-01: 7.5 mg via INTRAVENOUS
  Administered 2011-03-01: 11:00:00 via INTRAVENOUS
  Administered 2011-03-01: 4.5 mg via INTRAVENOUS
  Administered 2011-03-01: 1.5 mg via INTRAVENOUS
  Administered 2011-03-01: 5.97 mg via INTRAVENOUS
  Administered 2011-03-01: 6 mg via INTRAVENOUS
  Filled 2011-02-28 (×2): qty 25

## 2011-02-28 MED ORDER — SODIUM CHLORIDE 0.9 % IV SOLN: 250.0000 mL | INTRAVENOUS | Status: DC

## 2011-02-28 MED ORDER — HYDROCODONE-ACETAMINOPHEN 5-325 MG PO TABS
1.0000 | ORAL_TABLET | ORAL | Status: DC | PRN
  Administered 2011-03-02: 2 via ORAL
  Administered 2011-03-03 (×3): 1 via ORAL

## 2011-02-28 MED ORDER — HYDROCODONE-ACETAMINOPHEN 5-325 MG PO TABS
1.0000 | ORAL_TABLET | ORAL | Status: DC | PRN
  Administered 2011-03-04: 1 via ORAL
  Filled 2011-02-28 (×2): qty 1
  Filled 2011-02-28: qty 2
  Filled 2011-02-28 (×2): qty 1

## 2011-02-28 MED ORDER — VECURONIUM BROMIDE 10 MG IV SOLR
INTRAVENOUS | Status: DC | PRN
  Administered 2011-02-28 (×4): 2 mg via INTRAVENOUS

## 2011-02-28 MED ORDER — SODIUM CHLORIDE 0.9 % IV SOLN
INTRAVENOUS | Status: DC | PRN
  Administered 2011-02-28: 18:00:00 via INTRAVENOUS

## 2011-02-28 MED ORDER — ONDANSETRON HCL 4 MG/2ML IJ SOLN
INTRAMUSCULAR | Status: DC | PRN
  Administered 2011-02-28: 4 mg via INTRAVENOUS

## 2011-02-28 MED ORDER — ACETAMINOPHEN 650 MG RE SUPP: 650.0000 mg | RECTAL | Status: DC | PRN

## 2011-02-28 MED ORDER — MIDAZOLAM HCL 5 MG/5ML IJ SOLN
INTRAMUSCULAR | Status: DC | PRN
  Administered 2011-02-28: 1 mg via INTRAVENOUS

## 2011-02-28 MED ORDER — ALBUMIN HUMAN 5 % IV SOLN
INTRAVENOUS | Status: DC | PRN
  Administered 2011-02-28: 18:00:00 via INTRAVENOUS

## 2011-02-28 MED ORDER — SODIUM CHLORIDE 0.9 % IJ SOLN: 3.0000 mL | INTRAMUSCULAR | Status: DC | PRN

## 2011-02-28 MED ORDER — DIPHENHYDRAMINE HCL 12.5 MG/5ML PO ELIX
12.5000 mg | ORAL_SOLUTION | Freq: Four times a day (QID) | ORAL | Status: DC | PRN
  Administered 2011-03-02 (×2): 12.5 mg via ORAL
  Administered 2011-03-03: 25 mg via ORAL
  Administered 2011-03-04: 12.5 mg via ORAL
  Filled 2011-02-28 (×4): qty 10

## 2011-02-28 MED ORDER — PHENYLEPHRINE HCL 10 MG/ML IJ SOLN
INTRAMUSCULAR | Status: DC | PRN
  Administered 2011-02-28 (×3): 80 ug via INTRAVENOUS

## 2011-02-28 MED ORDER — PHENOL 1.4 % MT LIQD
1.0000 | OROMUCOSAL | Status: DC | PRN
  Filled 2011-02-28: qty 177

## 2011-02-28 MED ORDER — HETASTARCH-ELECTROLYTES 6 % IV SOLN
INTRAVENOUS | Status: DC | PRN
  Administered 2011-02-28: 16:00:00 via INTRAVENOUS

## 2011-02-28 MED ORDER — TRAMADOL HCL 50 MG PO TABS
50.0000 mg | ORAL_TABLET | Freq: Four times a day (QID) | ORAL | Status: DC
  Administered 2011-03-01 – 2011-03-04 (×13): 50 mg via ORAL
  Filled 2011-02-28 (×18): qty 1

## 2011-02-28 MED ORDER — ROCURONIUM BROMIDE 100 MG/10ML IV SOLN
INTRAVENOUS | Status: DC | PRN
  Administered 2011-02-28: 50 mg via INTRAVENOUS

## 2011-02-28 MED ORDER — HYDROMORPHONE HCL PF 1 MG/ML IJ SOLN
0.2500 mg | INTRAMUSCULAR | Status: DC | PRN
  Administered 2011-02-28 (×4): 0.5 mg via INTRAVENOUS

## 2011-02-28 MED ORDER — DOXYCYCLINE HYCLATE 100 MG PO CAPS: 100.0000 mg | ORAL_CAPSULE | Freq: Every day | ORAL | Status: DC

## 2011-02-28 MED ORDER — DOXYCYCLINE HYCLATE 100 MG PO TABS
100.0000 mg | ORAL_TABLET | Freq: Every day | ORAL | Status: DC
  Administered 2011-02-28 – 2011-03-04 (×5): 100 mg via ORAL
  Filled 2011-02-28 (×5): qty 1

## 2011-02-28 MED ORDER — DIPHENHYDRAMINE HCL 50 MG/ML IJ SOLN: 12.5000 mg | Freq: Four times a day (QID) | INTRAMUSCULAR | Status: DC | PRN

## 2011-02-28 MED ORDER — SODIUM CHLORIDE 0.9 % IR SOLN
Status: DC | PRN
  Administered 2011-02-28: 1000 mL

## 2011-02-28 MED ORDER — KCL IN DEXTROSE-NACL 20-5-0.45 MEQ/L-%-% IV SOLN
INTRAVENOUS | Status: DC
  Administered 2011-02-28: 23:00:00 via INTRAVENOUS
  Filled 2011-02-28 (×7): qty 1000

## 2011-02-28 MED ORDER — SODIUM CHLORIDE 0.9 % IJ SOLN: 9.0000 mL | INTRAMUSCULAR | Status: DC | PRN

## 2011-02-28 MED ORDER — MENTHOL 3 MG MT LOZG: 1.0000 | LOZENGE | OROMUCOSAL | Status: DC | PRN

## 2011-02-28 MED ORDER — ACETAMINOPHEN 325 MG PO TABS
650.0000 mg | ORAL_TABLET | ORAL | Status: DC | PRN
  Administered 2011-03-01: 650 mg via ORAL
  Filled 2011-02-28: qty 2

## 2011-02-28 MED ORDER — ONDANSETRON HCL 4 MG/2ML IJ SOLN: 4.0000 mg | Freq: Once | INTRAMUSCULAR | Status: DC | PRN

## 2011-02-28 MED ORDER — AMITRIPTYLINE HCL 25 MG PO TABS
25.0000 mg | ORAL_TABLET | Freq: Every day | ORAL | Status: DC
  Administered 2011-02-28 – 2011-03-01 (×2): 25 mg via ORAL
  Filled 2011-02-28 (×5): qty 1

## 2011-02-28 MED ORDER — FENTANYL CITRATE 0.05 MG/ML IJ SOLN
INTRAMUSCULAR | Status: DC | PRN
  Administered 2011-02-28: 50 ug via INTRAVENOUS
  Administered 2011-02-28: 100 ug via INTRAVENOUS
  Administered 2011-02-28 (×2): 50 ug via INTRAVENOUS
  Administered 2011-02-28: 100 ug via INTRAVENOUS

## 2011-02-28 MED ORDER — ONDANSETRON HCL 4 MG/2ML IJ SOLN
4.0000 mg | INTRAMUSCULAR | Status: DC | PRN
  Administered 2011-02-28 – 2011-03-01 (×2): 4 mg via INTRAVENOUS
  Administered 2011-03-01: 2 mg via INTRAVENOUS
  Filled 2011-02-28 (×3): qty 2

## 2011-02-28 MED ORDER — CEFAZOLIN SODIUM 1-5 GM-% IV SOLN
1.0000 g | Freq: Three times a day (TID) | INTRAVENOUS | Status: AC
  Administered 2011-02-28 – 2011-03-01 (×2): 1 g via INTRAVENOUS
  Filled 2011-02-28 (×2): qty 50

## 2011-02-28 MED ORDER — OXYCODONE-ACETAMINOPHEN 5-325 MG PO TABS
1.0000 | ORAL_TABLET | ORAL | Status: DC | PRN
  Administered 2011-03-01: 1 via ORAL
  Administered 2011-03-01: 2 via ORAL
  Administered 2011-03-01 – 2011-03-02 (×4): 1 via ORAL
  Administered 2011-03-02: 2 via ORAL
  Administered 2011-03-03 – 2011-03-04 (×2): 1 via ORAL
  Filled 2011-02-28 (×2): qty 2
  Filled 2011-02-28 (×2): qty 1
  Filled 2011-02-28 (×2): qty 2
  Filled 2011-02-28 (×3): qty 1

## 2011-02-28 MED ORDER — SODIUM CHLORIDE 0.9 % IR SOLN
Status: DC | PRN
  Administered 2011-02-28: 14:00:00

## 2011-02-28 MED ORDER — POLYETHYLENE GLYCOL 3350 17 G PO PACK
17.0000 g | PACK | Freq: Every day | ORAL | Status: DC | PRN
  Administered 2011-03-02: 17 g via ORAL
  Filled 2011-02-28: qty 1

## 2011-02-28 MED ORDER — ZOLPIDEM TARTRATE 10 MG PO TABS: 10.0000 mg | ORAL_TABLET | Freq: Every evening | ORAL | Status: DC | PRN

## 2011-02-28 MED ORDER — METHYLPHENIDATE HCL ER 10 MG PO TBCR: 27.0000 mg | EXTENDED_RELEASE_TABLET | ORAL | Status: DC | PRN

## 2011-02-28 MED ORDER — LACTATED RINGERS IV SOLN: INTRAVENOUS | Status: DC

## 2011-02-28 MED ORDER — SODIUM CHLORIDE 0.9 % IV SOLN
10.0000 mg | INTRAVENOUS | Status: DC | PRN
  Administered 2011-02-28: 10 ug/min via INTRAVENOUS

## 2011-02-28 MED ORDER — PROPOFOL 10 MG/ML IV EMUL
INTRAVENOUS | Status: DC | PRN
  Administered 2011-02-28: 150 mg via INTRAVENOUS
  Administered 2011-02-28: 50 mg via INTRAVENOUS

## 2011-02-28 MED ORDER — NALOXONE HCL 0.4 MG/ML IJ SOLN: 0.4000 mg | INTRAMUSCULAR | Status: DC | PRN

## 2011-02-28 MED ORDER — FLUTICASONE PROPIONATE 50 MCG/ACT NA SUSP
1.0000 | Freq: Every day | NASAL | Status: DC
  Administered 2011-02-28 – 2011-03-04 (×5): 1 via NASAL
  Filled 2011-02-28: qty 16

## 2011-02-28 SURGICAL SUPPLY — 82 items
BAG DECANTER FOR FLEXI CONT (MISCELLANEOUS) ×2 IMPLANT
BENZOIN TINCTURE PRP APPL 2/3 (GAUZE/BANDAGES/DRESSINGS) IMPLANT
BLADE SURG ROTATE 9660 (MISCELLANEOUS) IMPLANT
BONE VOID FILLER STRIP 10CC (Bone Implant) ×4 IMPLANT
BUR MATCHSTICK NEURO 3.0 LAGG (BURR) ×2 IMPLANT
BUR PRECISION FLUTE 5.0 (BURR) ×2 IMPLANT
CAGE 9MM (Cage) ×4 IMPLANT
CANISTER SUCTION 2500CC (MISCELLANEOUS) ×2 IMPLANT
CLOTH BEACON ORANGE TIMEOUT ST (SAFETY) ×2 IMPLANT
CONT SPEC 4OZ CLIKSEAL STRL BL (MISCELLANEOUS) ×4 IMPLANT
COVER BACK TABLE 24X17X13 BIG (DRAPES) IMPLANT
COVER TABLE BACK 60X90 (DRAPES) ×2 IMPLANT
DERMABOND ADVANCED (GAUZE/BANDAGES/DRESSINGS) ×1
DERMABOND ADVANCED .7 DNX12 (GAUZE/BANDAGES/DRESSINGS) ×1 IMPLANT
DRAPE C-ARM 42X72 X-RAY (DRAPES) ×8 IMPLANT
DRAPE LAPAROTOMY 100X72X124 (DRAPES) ×2 IMPLANT
DRAPE POUCH INSTRU U-SHP 10X18 (DRAPES) ×2 IMPLANT
DRAPE SURG 17X23 STRL (DRAPES) ×2 IMPLANT
DRESSING TELFA 8X3 (GAUZE/BANDAGES/DRESSINGS) IMPLANT
DURAPREP 26ML APPLICATOR (WOUND CARE) ×2 IMPLANT
ELECT REM PT RETURN 9FT ADLT (ELECTROSURGICAL) ×2
ELECTRODE REM PT RTRN 9FT ADLT (ELECTROSURGICAL) ×1 IMPLANT
EVACUATOR 1/8 PVC DRAIN (DRAIN) ×2 IMPLANT
GAUZE SPONGE 4X4 16PLY XRAY LF (GAUZE/BANDAGES/DRESSINGS) ×2 IMPLANT
GLOVE BIO SURGEON STRL SZ8 (GLOVE) ×4 IMPLANT
GLOVE BIOGEL PI IND STRL 6.5 (GLOVE) ×1 IMPLANT
GLOVE BIOGEL PI IND STRL 7.5 (GLOVE) ×1 IMPLANT
GLOVE BIOGEL PI IND STRL 8 (GLOVE) ×3 IMPLANT
GLOVE BIOGEL PI IND STRL 8.5 (GLOVE) ×3 IMPLANT
GLOVE BIOGEL PI INDICATOR 6.5 (GLOVE) ×1
GLOVE BIOGEL PI INDICATOR 7.5 (GLOVE) ×1
GLOVE BIOGEL PI INDICATOR 8 (GLOVE) ×3
GLOVE BIOGEL PI INDICATOR 8.5 (GLOVE) ×3
GLOVE ECLIPSE 7.5 STRL STRAW (GLOVE) ×14 IMPLANT
GLOVE ECLIPSE 8.5 STRL (GLOVE) ×2 IMPLANT
GLOVE EXAM NITRILE LRG STRL (GLOVE) IMPLANT
GLOVE EXAM NITRILE MD LF STRL (GLOVE) ×2 IMPLANT
GLOVE EXAM NITRILE XL STR (GLOVE) IMPLANT
GLOVE EXAM NITRILE XS STR PU (GLOVE) IMPLANT
GLOVE SS BIOGEL STRL SZ 6.5 (GLOVE) ×1 IMPLANT
GLOVE SUPERSENSE BIOGEL SZ 6.5 (GLOVE) ×1
GOWN BRE IMP SLV AUR LG STRL (GOWN DISPOSABLE) ×2 IMPLANT
GOWN BRE IMP SLV AUR XL STRL (GOWN DISPOSABLE) ×8 IMPLANT
GOWN STRL REIN 2XL LVL4 (GOWN DISPOSABLE) ×8 IMPLANT
KIT BASIN OR (CUSTOM PROCEDURE TRAY) ×2 IMPLANT
KIT INFUSE MEDIUM (Orthopedic Implant) ×2 IMPLANT
KIT POSITION SURG JACKSON T1 (MISCELLANEOUS) ×2 IMPLANT
KIT ROOM TURNOVER OR (KITS) ×2 IMPLANT
MILL MEDIUM DISP (BLADE) ×2 IMPLANT
NEEDLE 18GX1X1/2 (RX/OR ONLY) (NEEDLE) ×2 IMPLANT
NEEDLE HYPO 25X1 1.5 SAFETY (NEEDLE) ×2 IMPLANT
NEEDLE SPNL 18GX3.5 QUINCKE PK (NEEDLE) ×2 IMPLANT
NS IRRIG 1000ML POUR BTL (IV SOLUTION) ×2 IMPLANT
PACK LAMINECTOMY NEURO (CUSTOM PROCEDURE TRAY) ×2 IMPLANT
PAD ARMBOARD 7.5X6 YLW CONV (MISCELLANEOUS) ×6 IMPLANT
PATTIES SURGICAL .5 X.5 (GAUZE/BANDAGES/DRESSINGS) IMPLANT
PATTIES SURGICAL .5 X1 (DISPOSABLE) IMPLANT
PATTIES SURGICAL 1X1 (DISPOSABLE) ×2 IMPLANT
PEEK PLIF NOVEL 9X25X8MM (Peek) ×4 IMPLANT
ROD TI 5.5MM 6CM (Rod) ×12 IMPLANT
SCREW 40MM (Screw) ×10 IMPLANT
SCREW POLYAX 6.5X45MM (Screw) ×2 IMPLANT
SCREW SET SPINAL STD HEXALOBE (Screw) ×12 IMPLANT
SPONGE GAUZE 4X4 12PLY (GAUZE/BANDAGES/DRESSINGS) ×2 IMPLANT
SPONGE LAP 4X18 X RAY DECT (DISPOSABLE) IMPLANT
SPONGE SURGIFOAM ABS GEL 100 (HEMOSTASIS) IMPLANT
SPONGE SURGIFOAM ABS GEL SZ50 (HEMOSTASIS) ×2 IMPLANT
STAPLER SKIN PROX WIDE 3.9 (STAPLE) ×2 IMPLANT
STRIP CLOSURE SKIN 1/2X4 (GAUZE/BANDAGES/DRESSINGS) IMPLANT
SUT VIC AB 1 CT1 18XBRD ANBCTR (SUTURE) ×2 IMPLANT
SUT VIC AB 1 CT1 8-18 (SUTURE) ×2
SUT VIC AB 2-0 CT1 18 (SUTURE) ×4 IMPLANT
SUT VIC AB 3-0 SH 8-18 (SUTURE) ×4 IMPLANT
SYR 20CC LL (SYRINGE) ×2 IMPLANT
SYR 3ML LL SCALE MARK (SYRINGE) IMPLANT
SYR 5ML LL (SYRINGE) IMPLANT
SYR INSULIN 1ML 31GX6 SAFETY (SYRINGE) IMPLANT
TOWEL OR 17X24 6PK STRL BLUE (TOWEL DISPOSABLE) ×2 IMPLANT
TOWEL OR 17X26 10 PK STRL BLUE (TOWEL DISPOSABLE) ×2 IMPLANT
TRAP SPECIMEN MUCOUS 40CC (MISCELLANEOUS) ×2 IMPLANT
TRAY FOLEY CATH 14FRSI W/METER (CATHETERS) ×2 IMPLANT
WATER STERILE IRR 1000ML POUR (IV SOLUTION) ×2 IMPLANT

## 2011-02-28 NOTE — Anesthesia Procedure Notes (Signed)
Procedure Name: Intubation Date/Time: 02/28/2011 1:45 PM Performed by: Everlene Balls TODD Pre-anesthesia Checklist: Patient identified, Emergency Drugs available, Suction available and Patient being monitored Patient Re-evaluated:Patient Re-evaluated prior to inductionOxygen Delivery Method: Circle System Utilized Preoxygenation: Pre-oxygenation with 100% oxygen Intubation Type: IV induction Ventilation: Mask ventilation without difficulty Laryngoscope Size: Mac and 3 Grade View: Grade IV Tube type: Oral Tube size: 7.5 mm Number of attempts: 1 Airway Equipment and Method: bougie stylet Placement Confirmation: positive ETCO2 and breath sounds checked- equal and bilateral Secured at: 22 cm Tube secured with: Tape

## 2011-02-28 NOTE — Op Note (Signed)
02/28/2011  6:41 PM  PATIENT:  Sherry [name]  65 y.o. female  PRE-OPERATIVE DIAGNOSIS:  Lumbar three-four, four-five spondylosis, radiculopathy  POST-OPERATIVE DIAGNOSIS:  Lumbar three-four, four-five spondylosis, and radiculopathy  PROCEDURE:  Procedure(s): POSTERIOR LUMBAR FUSION 2 LEVEL  SURGEON:  Surgeon(s): Dorian Heckle, MD Shary Key [facility]ner  PHYSICIAN ASSISTANT:   ASSISTANTS: Poteat RN, [facility]ner   ANESTHESIA:   general  EBL:  Total I/O In: 3240 [I.V.:2250; Blood:240; IV Piggyback:750] Out: 1055 [Urine:405; Blood:650]  BLOOD ADMINISTERED:240 CC CELLSAVER  DRAINS: (Medium) Hemovact drain(s) in the Epidural space with  Suction Clamped   LOCAL MEDICATIONS USED:  OTHER Experel 20cc  SPECIMEN:  No Specimen  DISPOSITION OF SPECIMEN:  N/A  COUNTS:  YES  TOURNIQUET:  * No tourniquets in log *  DICTATION: Patient has spondylo-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 It was elected to perform decompression and fusion at the L3-L5 levels.  After the smooth and uncomplicated induction of general endotracheal anesthesia, the patient was placed in a prone position on the [place] table. Her low back was prepped with DuraPrep after taking care to pad and protect all soft tissue and bony prominences. An incision was made after infiltrating the skin with local lidocaine and carried to the lumbodorsal fascia which was incised bilaterally to expose the L3-L4 and L5 transverse processes bilaterally. After localizing x-ray was obtained a complete laminectomy of L4 was performed with inferior two thirds of L3 also removed. The dura was thoroughly decompressed under loupe magnification. Dense adherent scar tissue which proved difficult to remove entirely from the dura but we were able to decompress the neural elements bilaterally including the L3-L4 and L5 nerve roots. There was a far lateral disc herniation at the L4-5 level on the right causing right L5 nerve root compression.  After decompressing the neural elements thorough discectomies and preparation the endplates was performed at the L3-4 and L4-5 levels. All the local bone was run through the bone mill and used as local bone autograft. This was supplemented by medium BMP kit and NexOss sponge. An 8 mm medium peek cages were placed bilaterally at the L3-4 level after a significant amount of bone autograft was placed in the inter space and countersunk. 9 mm cages were packed in a similar fashion at the L4-5 level and additionally bone autograft was placed in this interspace. Following of cages the complete decompression of the right L4 nerve root was performed with removal of the lateral disc herniation. The posterolateral region was then prepared for grafting. 6.5 x 40 mm pedicle screws were placed at L3L4 and L5 bilaterally and their positioning was confirmed on AP and lateral fluoroscopy. On the right side the remaining BMP and NexOss was placed. The remaining bone autograft was placed on the left. 60 mm pre-lordosis rods were then affixed to the screw heads and locked down in situ. The wound had been extensively irrigated. A medium Hemovac drain was placed and anchored with a stitch. The fascia was reapproximated with 1 Vicryl sutures the subcutaneous tissues were reapproximated with 2-0 Vicryl stitches and the skin edges were reapproximated with 3-0 Vicryl subcuticular stitches followed by a sterile occlusive dressing with benzoin Steri-Strips Telfa gauze and tape. Counts were correct in at the end of the case.  PLAN OF CARE: Admit to inpatient   PATIENT DISPOSITION:  PACU - hemodynamically stable.   Delay start of Pharmacological VTE agent (>24hrs) due to surgical blood loss or risk of bleeding:  YES

## 2011-02-28 NOTE — Anesthesia Preprocedure Evaluation (Signed)
Anesthesia Evaluation  Patient identified by MRN, date of birth, ID band Patient awake    Reviewed: Allergy & Precautions, H&P , NPO status , Patient's Chart, lab work & pertinent test results  Airway Mallampati: I TM Distance: >3 FB Neck ROM: full    Dental No notable dental hx. (+) Teeth Intact   Pulmonary neg pulmonary ROS,    Pulmonary exam normal       Cardiovascular Exercise Tolerance: Good neg cardio ROS regular Normal    Neuro/Psych    GI/Hepatic Neg liver ROS, hiatal hernia, GERD-  ,  Endo/Other  Negative Endocrine ROS  Renal/GU negative Renal ROS  Genitourinary negative   Musculoskeletal   Abdominal   Peds  Hematology negative hematology ROS (+)   Anesthesia Other Findings   Reproductive/Obstetrics                           Anesthesia Physical Anesthesia Plan  ASA: I  Anesthesia Plan: General   Post-op Pain Management:    Induction: Intravenous  Airway Management Planned: Oral ETT  Additional Equipment:   Intra-op Plan:   Post-operative Plan: Extubation in OR  Informed Consent: I have reviewed the patients History and Physical, chart, labs and discussed the procedure including the risks, benefits and alternatives for the proposed anesthesia with the patient or authorized representative who has indicated his/her understanding and acceptance.     Plan Discussed with: Anesthesiologist, CRNA and Surgeon  Anesthesia Plan Comments:         Anesthesia Quick Evaluation

## 2011-02-28 NOTE — Anesthesia Postprocedure Evaluation (Signed)
  Anesthesia Post-op Note  Patient: Sherry [name]  Procedure(s) Performed:  POSTERIOR LUMBAR FUSION 2 LEVEL - Lumbar three-four, four-five Decompression with Posterior Lumbar Interbody Fusion with Interbody Prosthesis Posteriolateral Arthrodesis and Posterior Segmental Instrumentation   Patient Location: PACU  Anesthesia Type: General  Level of Consciousness: awake and sedated  Airway and Oxygen Therapy: Patient Spontanous Breathing and Patient connected to nasal cannula oxygen  Post-op Pain: mild  Post-op Assessment: Post-op Vital signs reviewed, Patient's Cardiovascular Status Stable, Respiratory Function Stable, Patent Airway and No signs of Nausea or vomiting  Post-op Vital Signs: stable  Complications: No apparent anesthesia complications

## 2011-02-28 NOTE — H&P (Signed)
Sherry [name] is an 65 y.o. female.   Chief Complaint: Back and bilateral leg pain HPI:   Patient complains of low back pain and was referred by her primary doctor with chief complaint of right leg pain and right-sided pain and right foot numbness. She says she went through physical therapy in the did not give her any relief including home exercises. She has fallen she had epidural steroid injections without relief. He has been taking hydrocodone and Valium as well as tramadol. She has been tearful complaining about pain and inability to function. Radiographs of the lumbar spine show mobile spondylolisthesis of L3 on L4 and of L4 on L5 area these change on flexion and extension views. An MRI demonstrates marked stenosis at L3 4 and L4-5 levels with a far lateral disc protrusion at L4-5 on the right.  Past Medical History  Diagnosis Date  . TMJ (dislocation of temporomandibular joint)   . Bronchitis   . Carpal tunnel syndrome     bilateral  . Arthritis   . Chronic back pain     herniated disc,stenosis,radiculopathy,spondylolisthesis  . Chronic neck pain   . Eczema   . Hiatal hernia   . Constipation, chronic   . Hemorrhoids   . IBS (irritable bowel syndrome)   . Anxiety     valium prn  . Depression     Past Surgical History  Procedure Date  . Tonsillectomy     1951  . Appendectomy     1967  . Knee arthroscopy     1994  . Breast biopsy     and reduction in 1988  . Incision and drainage abscess / hematoma of bursa / knee / thigh     1994  . Bladder surgery     many yrs ago,but doesn't know what was actually done    Family History  Problem Relation Age of Onset  . Anesthesia problems Neg Hx   . Hypotension Neg Hx   . Malignant hyperthermia Neg Hx   . Pseudochol deficiency Neg Hx    Social History:  reports that she has quit smoking. She has never used smokeless tobacco. She reports that she drinks about .6 ounces of alcohol per week. She reports that she does not use  illicit drugs.  Allergies:  Allergies  Allergen Reactions  . Adhesive (Tape)     blisters    Medications Prior to Admission  Medication Dose Route Frequency Provider Last Rate Last Dose  . ceFAZolin (ANCEF) IVPB 2 g/50 mL premix  2 g Intravenous 60 min Pre-Op Dorian Heckle, MD       Medications Prior to Admission  Medication Sig Dispense Refill  . amitriptyline (ELAVIL) 25 MG tablet Take 1 tablet (25 mg total) by mouth at bedtime.  30 tablet  5  . diazepam (VALIUM) 2 MG tablet Take 2 mg by mouth at bedtime as needed. For sleep/anxiety      . doxycycline (VIBRAMYCIN) 100 MG capsule Take 100 mg by mouth daily.      . ergocalciferol (VITAMIN D2) 50000 UNITS capsule Take 50,000 mg by mouth once a week.       . Estradiol-Norethindrone Acet 0.5-0.1 MG per tablet Take 1 tablet by mouth daily.      Marland Kitchen KENALOG topical spray Apply 1 g topically as needed. Apply to affected area as needed      . ketoconazole (NIZORAL) 2 % shampoo Apply 1 application topically once a week.       Marland Kitchen  methylphenidate (CONCERTA) 27 MG CR tablet Take 27 mg by mouth as needed. For attention deficit disorder      . traMADol (ULTRAM) 50 MG tablet TAKE 1 TABLET BY MOUTH 3 TIMES A DAY AS NEEDED FOR PAIN  90 tablet  1  . zolpidem (AMBIEN) 10 MG tablet Take 3 mg by mouth At bedtime.       Marland Kitchen HYDROcodone-acetaminophen (VICODIN) 5-500 MG per tablet Take 1 tablet by mouth every 4 (four) hours as needed for pain.  60 tablet  0    No results found for this or any previous visit (from the past 48 hour(s)). No results found.  Review of Systems  Eyes:       Wears glasses  Neurological:       Arm weakness  Psychiatric/Behavioral: The patient is nervous/anxious.     Blood pressure 130/88, pulse 88, temperature 98.1 F (36.7 C), temperature source Oral, resp. rate 20, weight 65.3 kg (143 lb 15.4 oz), SpO2 96.00%. Physical Exam   patient has a step off at the lumbosacral junction she is able to stand on her heels and toes. She  walks with an antalgic gait favoring her right lower Gemini. He has a positive straight leg raise at 60 on the right. She is increased pain in her back and her leg with extension. He is able to bend touch her toes. She has bilateral sciatic notch discomfort to palpation. She has full strength in her upper trimming these lower extremities with the exception of hip abductors at 4 minus out of 5 on the right dorsiflexion at 4/5 on the right and extensor hallucis longus of 4 minus out of 5 on the right. He flexes are symmetric in the upper and lower extremities. Assessment/Plan     at this point given the patient's mobile spondylolisthesis with severe stenosis and disc herniation and failure to improve with extensive conservative management I recommended she undergo surgery. This will consist of a posterior decompression and fusion L3-L5 levels. While she has some degenerative changes at L5-S1 on the MRI these are not severe and do not appear to be contributing to her current symptomatology. Risks and benefits were discussed with the patient in detail and she wishes to proceed.   Bingham Millette D 02/28/2011, 1:19 PM

## 2011-02-28 NOTE — Preoperative (Signed)
Beta Blockers   Reason not to administer Beta Blockers:Not Applicable 

## 2011-02-28 NOTE — Transfer of Care (Signed)
Immediate Anesthesia Transfer of Care Note  Patient: Sherry [name]  Procedure(s) Performed:  POSTERIOR LUMBAR FUSION 2 LEVEL - Lumbar three-four, four-five Decompression with Posterior Lumbar Interbody Fusion with Interbody Prosthesis Posteriolateral Arthrodesis and Posterior Segmental Instrumentation   Patient Location: PACU  Anesthesia Type: General  Level of Consciousness: awake and oriented  Airway & Oxygen Therapy: Patient Spontanous Breathing and Patient connected to nasal cannula oxygen  Post-op Assessment: Report given to PACU RN, Post -op Vital signs reviewed and stable and Patient moving all extremities  Post vital signs: Reviewed and stable  Complications: No apparent anesthesia complications

## 2011-03-01 LAB — GLUCOSE, CAPILLARY: Glucose-Capillary: 135 mg/dL — ABNORMAL HIGH (ref 70–99)

## 2011-03-01 NOTE — Progress Notes (Signed)
Subjective: Patient reports nausea and Complains of back pain some numbness and right foot  Objective: Vital signs in last 24 hours: Temp:  [97.6 F (36.4 C)-98 F (36.7 C)] 98 F (36.7 C) (11/17 0920) Pulse Rate:  [81-89] 85  (11/17 0920) Resp:  [17-24] 19  (11/17 0920) BP: (112-132)/(70-75) 119/73 mmHg (11/17 0920) SpO2:  [94 %-100 %] 99 % (11/17 1032) Weight:  [71.804 kg (158 lb 4.8 oz)] 158 lb 4.8 oz (71.804 kg) (11/16 2126)  Intake/Output from previous day: 11/16 0701 - 11/17 0700 In: 3840 [I.V.:2850; Blood:240; IV Piggyback:750] Out: 1905 [Urine:905; Drains:350; Blood:650] Intake/Output this shift: Total I/O In: 3 [I.V.:3] Out: -   Motor function is good in both lower extremities in the quadriceps tibialis anterior and gastrocs dressings dry and intact  Lab Results: No results found for this basename: WBC:2,HGB:2,HCT:2,PLT:2 in the last 72 hours BMET No results found for this basename: NA:2,K:2,CL:2,CO2:2,GLUCOSE:2,BUN:2,CREATININE:2,CALCIUM:2 in the last 72 hours  Studies/Results: Dg Lumbar Spine 2-3 Views  02/28/2011  *RADIOLOGY REPORT*  Clinical Data: Lumbar fusion L3-4 and L4-5  LUMBAR SPINE - 2-3 VIEW  Comparison: Intraoperative imaging same date, lumbar spine MRI 11/19/2010  Findings: Nine intra procedural fluoroscopic images demonstrate posterior fusion of L3-4 and L4-5 with placement of intervertebral disc spacers.  Alignment is near anatomic.  No evidence for hardware failure.  IMPRESSION: Expected intraoperative fluoroscopic imaging appearance from L3-4 and L4-5 posterior fusion.  Original Report Authenticated By: Harrel Lemon, M.D.   Dg Lumbar Spine 1 View  02/28/2011  *RADIOLOGY REPORT*  Clinical Data: Lumbar spinal stenosis.  LUMBAR SPINE - 1 VIEW  Comparison: MRI dated 11/19/2010  Findings: Instruments are present the levels of the inferior aspects of the pedicles of L3 and L4 and at the mid pedicle level of L5.  IMPRESSION: Instruments spanning from  L3-4 to L4-5.  Original Report Authenticated By: Gwynn Burly, M.D.   Dg C-arm 1-60 Min  02/28/2011  CLINICAL DATA: L2-3/3-4 Lumbar Fusion   C-ARM 1-60 MINUTES  Fluoroscopy was utilized by the requesting physician.  No radiographic  interpretation.      Assessment/Plan: Continue PCA morphine today. Start Percocet in morning DC Foley catheter today. Ambulate.  LOS: 1 day  Slow improvement   Guiselle Mian J 03/01/2011, 11:01 AM

## 2011-03-01 NOTE — Plan of Care (Signed)
Problem: Consults Goal: Diagnosis - Spinal Surgery Microdiscectomy     

## 2011-03-02 MED ORDER — BISACODYL 10 MG RE SUPP
10.0000 mg | Freq: Every day | RECTAL | Status: DC | PRN
  Administered 2011-03-02 – 2011-03-03 (×2): 10 mg via RECTAL
  Filled 2011-03-02 (×2): qty 1

## 2011-03-02 NOTE — Progress Notes (Signed)
Occupational Therapy Evaluation Patient Details Name: Sherry [name] MRN: [id number] DOB: 04-14-1946 Today's Date: 03/02/2011  Problem List:  Patient Active Problem List  Diagnoses  . ANXIETY  . DEPRESSION  . GERD  . CONSTIPATION  . FLATULENCE-GAS-BLOATING  . ABDOMINAL PAIN, UNSPECIFIED SITE  . RLQ PAIN  . ABNORMAL FINDINGS GI TRACT  . PERSONAL HX COLONIC POLYPS  . DEGENERATIVE DISC DISEASE, LUMBAR SPINE  . BACK PAIN, LUMBAR    Past Medical History:  Past Medical History  Diagnosis Date  . TMJ (dislocation of temporomandibular joint)   . Bronchitis   . Carpal tunnel syndrome     bilateral  . Arthritis   . Chronic back pain     herniated disc,stenosis,radiculopathy,spondylolisthesis  . Chronic neck pain   . Eczema   . Hiatal hernia   . Constipation, chronic   . Hemorrhoids   . IBS (irritable bowel syndrome)   . Anxiety     valium prn  . Depression    Past Surgical History:  Past Surgical History  Procedure Date  . Tonsillectomy     1951  . Appendectomy     1967  . Knee arthroscopy     1994  . Breast biopsy     and reduction in 1988  . Incision and drainage abscess / hematoma of bursa / knee / thigh     1994  . Bladder surgery     many yrs ago,but doesn't know what was actually done    OT Assessment/Plan/Recommendation OT Assessment Clinical Impression Statement: Pt is a 65 year old woman recovering from back surgery who was independent and active PTA.  Pt now requires min assist for mobility and min to mod assist for ADL.  Will follow pt acutely.  Recommend HHOT and 24 hr supervision upon return home initially.  Pt also needs a 3in 1 for home. OT Recommendation/Assessment: Patient will need skilled OT in the acute care venue OT Problem List: Decreased strength;Decreased knowledge of use of DME or AE;Decreased knowledge of precautions;Pain OT Therapy Diagnosis : Generalized weakness;Acute pain OT Plan OT Frequency: Min 2X/week OT  Treatment/Interventions: DME and/or AE instruction;Self-care/ADL training;Therapeutic activities;Patient/family education OT Recommendation Follow Up Recommendations: Home health OT Equipment Recommended: None recommended by PT Individuals Consulted Consulted and Agree with Results and Recommendations: Patient OT Goals Acute Rehab OT Goals OT Goal Formulation: With patient Time For Goal Achievement: 7 days ADL Goals Pt Will Perform Grooming: with modified independence;Standing at sink (3 activities) ADL Goal: Grooming - Progress: Other (comment) Pt Will Perform Lower Body Bathing: Unsupported;Sitting, edge of bed;with adaptive equipment;with supervision ADL Goal: Lower Body Bathing - Progress: Other (comment) Pt Will Perform Lower Body Dressing: with supervision;Sit to stand from bed;Sitting, bed;Unsupported;with adaptive equipment ADL Goal: Lower Body Dressing - Progress: Other (comment) Pt Will Transfer to Toilet: with supervision;3-in-1;Maintaining back safety precautions (over toilet) ADL Goal: Toilet Transfer - Progress: Other (comment) Pt Will Perform Tub/Shower Transfer: Shower transfer;with supervision;Maintaining back safety precautions (3 in 1 in shower) ADL Goal: Tub/Shower Transfer - Progress: Other (comment) Miscellaneous OT Goals Miscellaneous OT Goal #1: Pt will generalize back precautions in ADL and mobility. OT Goal: Miscellaneous Goal #1 - Progress: Other (comment)  OT Evaluation Precautions/Restrictions  Precautions Precautions: Back Precaution Booklet Issued: Yes (comment) (Back precautions handout given) Precaution Comments: Instructed patient on 3 back precautions Required Braces or Orthoses: Yes Spinal Brace: Lumbar corset;Applied in sitting position Restrictions Weight Bearing Restrictions: No Prior Functioning Home Living Lives With: Spouse Type of Home: House  Home Layout: Two level;1/2 bath on main level;Bed/bath upstairs Alternate Level Stairs-Rails:  Right Alternate Level Stairs-Number of Steps: flight Bathroom Shower/Tub: Walk-in shower;Door Bathroom Toilet: Standard Home Adaptive Equipment: Walker - rolling;Long-handled shoehorn;Reacher Additional Comments: equipment is borrowed from mother in law Prior Function Level of Independence: Independent with basic ADLs;Independent with homemaking with ambulation;Independent with gait Able to Take Stairs?: Yes Driving: Yes ADL ADL Eating/Feeding: Simulated;Independent Where Assessed - Eating/Feeding: Edge of bed Grooming: Performed;Wash/dry hands Where Assessed - Grooming: Standing at sink Upper Body Bathing: Simulated;Set up Where Assessed - Upper Body Bathing: Sitting, bed;Unsupported Lower Body Bathing: Simulated;Moderate assistance (not quite able to cross her foot over opposite knee) Where Assessed - Lower Body Bathing: Sit to stand from bed;Unsupported;Sitting, bed Upper Body Dressing: Set up;Performed Upper Body Dressing Details (indicate cue type and reason):  (doffed front opening gown) Where Assessed - Upper Body Dressing: Sitting, chair;Unsupported Lower Body Dressing: Performed;Moderate assistance Where Assessed - Lower Body Dressing: Unsupported;Sitting, bed;Sit to stand from bed Toilet Transfer: Performed;Minimal assistance Toilet Transfer Method: Ambulating Toilet Transfer Equipment: Bedside commode (over toilet) Toileting - Clothing Manipulation: Supervision/safety Where Assessed - Toileting Clothing Manipulation: Standing Toileting - Hygiene: Performed;Independent Where Assessed - Toileting Hygiene: Sit to stand from 3-in-1 or toilet Equipment Used: Rolling walker Vision/Perception  Vision - History Baseline Vision: Wears glasses only for reading Cognition Cognition Arousal/Alertness: Awake/alert Overall Cognitive Status: Appears within functional limits for tasks assessed Orientation Level: Oriented X4 Sensation/Coordination Sensation Light Touch: Appears  Intact Hot/Cold: Appears Intact Coordination Gross Motor Movements are Fluid and Coordinated: Yes Fine Motor Movements are Fluid and Coordinated: Yes Extremity Assessment RUE Assessment RUE Assessment: Within Functional Limits LUE Assessment LUE Assessment: Within Functional Limits Mobility  Bed Mobility Bed Mobility: No Transfers Sit to Stand: 4: Min assist;With upper extremity assist;With armrests;From chair/3-in-1 Sit to Stand Details (indicate cue type and reason): Cues for safe hand placement Stand to Sit: 4: Min assist;With upper extremity assist;With armrests;To chair/3-in-1 Stand to Sit Details: Cues for technique and to reach back for chair End of Session OT - End of Session Equipment Utilized During Treatment: Gait belt;Back brace Activity Tolerance: Patient tolerated treatment well Patient left: in bed;with family/visitor present;with call bell in reach General Behavior During Session: [facility] for tasks performed Cognition: [facility] for tasks performed   Evern Bio 03/02/2011, 5:34 PM [phone]

## 2011-03-02 NOTE — Progress Notes (Signed)
Physical Therapy Evaluation Patient Details Name: Sherry [name] MRN: [id number] DOB: 1946-01-05 Today's Date: 03/02/2011  Problem List:  Patient Active Problem List  Diagnoses  . ANXIETY  . DEPRESSION  . GERD  . CONSTIPATION  . FLATULENCE-GAS-BLOATING  . ABDOMINAL PAIN, UNSPECIFIED SITE  . RLQ PAIN  . ABNORMAL FINDINGS GI TRACT  . PERSONAL HX COLONIC POLYPS  . DEGENERATIVE DISC DISEASE, LUMBAR SPINE  . BACK PAIN, LUMBAR    Past Medical History:  Past Medical History  Diagnosis Date  . TMJ (dislocation of temporomandibular joint)   . Bronchitis   . Carpal tunnel syndrome     bilateral  . Arthritis   . Chronic back pain     herniated disc,stenosis,radiculopathy,spondylolisthesis  . Chronic neck pain   . Eczema   . Hiatal hernia   . Constipation, chronic   . Hemorrhoids   . IBS (irritable bowel syndrome)   . Anxiety     valium prn  . Depression    Past Surgical History:  Past Surgical History  Procedure Date  . Tonsillectomy     1951  . Appendectomy     1967  . Knee arthroscopy     1994  . Breast biopsy     and reduction in 1988  . Incision and drainage abscess / hematoma of bursa / knee / thigh     1994  . Bladder surgery     many yrs ago,but doesn't know what was actually done    PT Assessment/Plan/Recommendation PT Assessment Clinical Impression Statement: Patient is a 65 yo female admitted for L3-5 fusion.  Patient did well with mobility today.  Will need 24 hour assist at discharge initially, and HHPT. PT Recommendation/Assessment: Patient will need skilled PT in the acute care venue PT Problem List: Decreased strength;Decreased activity tolerance;Decreased mobility;Decreased knowledge of use of DME;Decreased knowledge of precautions;Pain PT Therapy Diagnosis : Difficulty walking;Acute pain PT Plan PT Frequency: 7X/week PT Treatment/Interventions: Gait training;DME instruction;Stair training;Functional mobility training;Patient/family  education PT Recommendation Follow Up Recommendations: Home health PT;24 hour supervision/assistance Equipment Recommended: None recommended by PT PT Goals  Acute Rehab PT Goals PT Goal Formulation: With patient Time For Goal Achievement: 7 days Pt will Roll Supine to Right Side: Independently PT Goal: Rolling Supine to Right Side - Progress: Not met Pt will Roll Supine to Left Side: Independently PT Goal: Rolling Supine to Left Side - Progress: Not met Pt will go Supine/Side to Sit: Independently;with HOB 0 degrees PT Goal: Supine/Side to Sit - Progress: Not met Pt will go Sit to Supine/Side: Independently;with HOB 0 degrees PT Goal: Sit to Supine/Side - Progress: Not met Pt will Transfer Sit to Stand/Stand to Sit: with supervision;with upper extremity assist PT Transfer Goal: Sit to Stand/Stand to Sit - Progress: Not met Pt will Ambulate: 51 - 150 feet;with supervision;with rolling walker PT Goal: Ambulate - Progress: Not met Pt will Go Up / Down Stairs: 6-9 stairs;with min assist;with least restrictive assistive device PT Goal: Up/Down Stairs - Progress: Not met Additional Goals Additional Goal #1: Patient will recall 3/3 back precautions and integrate into mobility. PT Goal: Additional Goal #1 - Progress: Not met  PT Evaluation Precautions/Restrictions  Precautions Precautions: Back Precaution Booklet Issued: Yes (comment) (Back precautions handout given) Precaution Comments: Instructed patient on 3 back precautions Required Braces or Orthoses: Yes Spinal Brace: Lumbar corset;Applied in sitting position Restrictions Weight Bearing Restrictions: No Prior Functioning  Home Living Lives With: Spouse Type of Home: House Home Layout: Two level;1/2 bath  on main level;Bed/bath upstairs Alternate Level Stairs-Rails: Right Alternate Level Stairs-Number of Steps: flight Bathroom Shower/Tub: Walk-in shower;Door [employer] Toilet: Standard Home Adaptive Equipment: Walker -  rolling;Long-handled shoehorn;Reacher Prior Function Level of Independence: Independent with basic ADLs;Independent with homemaking with ambulation;Independent with gait Driving: Yes Cognition Cognition Arousal/Alertness: Awake/alert Overall Cognitive Status: Appears within functional limits for tasks assessed Orientation Level: Oriented X4 Sensation/Coordination Sensation Light Touch: Appears Intact Extremity Assessment RLE Assessment RLE Assessment: Within Functional Limits LLE Assessment LLE Assessment: Within Functional Limits Mobility (including Balance) Bed Mobility Bed Mobility: No Transfers Transfers: Yes Sit to Stand: 4: Min assist;With upper extremity assist;With armrests;From chair/3-in-1 Sit to Stand Details (indicate cue type and reason): Cues for safe hand placement Stand to Sit: 4: Min assist;With upper extremity assist;With armrests;To chair/3-in-1 Stand to Sit Details: Cues for technique and to reach back for chair Ambulation/Gait Ambulation/Gait: Yes Ambulation/Gait Assistance: 5: Supervision Ambulation/Gait Assistance Details (indicate cue type and reason): Cues for safe use of RW and gait sequence Ambulation Distance (Feet): 92 Feet Assistive device: Rolling walker Gait Pattern: Step-through pattern;Decreased stride length Gait velocity: Slow gait speed Stairs: No      End of Session PT - End of Session Equipment Utilized During Treatment: Gait belt;Back brace Activity Tolerance: Patient limited by pain;Patient tolerated treatment well Patient left: in chair;with call bell in reach;with family/visitor present Nurse Communication: Mobility status for ambulation General Behavior During Session: [facility] for tasks performed Cognition: [facility] for tasks performed  Durenda Hurt. Earlene Plater PT, [facility] Acute Rehab Services Pager [phone]  Vena Austria 03/02/2011, 5:31 PM

## 2011-03-02 NOTE — Progress Notes (Signed)
PCA was d/c'd on prior shift, 19 mg of morphine wasted; Peterson Lombard, Consulting civil engineer witnessed.

## 2011-03-02 NOTE — Progress Notes (Signed)
Patient states that her pain is better today.  The pain itself is confined to her lumbar region. She is having no radiating pain numbness or paresthesias. She states her strength feels good. She denies any abdominal pain. She is having no headaches.  On examination: She is awake and alert oriented and appropriate. She appears reasonably comfortable. Her motor examination is intact bilaterally. Sensory examination is nonfocal. Wound is clean and dry. Drain output is still moderately high. Abdomen is soft and nontender.  Progressing well following 2 level lumbar decompression and fusion. Continue to mobilize with physical therapy. Possible discharge as early as tomorrow.

## 2011-03-03 MED ORDER — FLEET ENEMA 7-19 GM/118ML RE ENEM
1.0000 | ENEMA | Freq: Once | RECTAL | Status: AC
  Administered 2011-03-03: 1 via RECTAL
  Filled 2011-03-03: qty 1

## 2011-03-03 NOTE — Progress Notes (Signed)
Physical Therapy Treatment Patient Details Name: Sherry [name] MRN: [id number] DOB: 1945/12/24 Today's Date: 03/03/2011  PT Assessment/Plan  PT - Assessment/Plan Comments on Treatment Session: Pt progressing well with PT goals.  Increased ambulation distance & did well with stair training today.   PT Plan: Discharge plan remains appropriate PT Frequency: 7X/week Follow Up Recommendations: Home health PT;24 hour supervision/assistance Equipment Recommended: 3 in 1 bedside comode PT Goals  Acute Rehab PT Goals PT Goal: Rolling Supine to Left Side - Progress: Progressing toward goal PT Goal: Supine/Side to Sit - Progress: Progressing toward goal PT Transfer Goal: Sit to Stand/Stand to Sit - Progress: Progressing toward goal PT Goal: Ambulate - Progress: Met PT Goal: Up/Down Stairs - Progress: Met Additional Goals PT Goal: Additional Goal #1 - Progress: Progressing toward goal  PT Treatment Precautions/Restrictions  Precautions Precautions: Back Precaution Booklet Issued: Yes (comment) (Back precautions handout given) Precaution Comments: Pt recalled 3/3 back precautions independently Required Braces or Orthoses: Yes Spinal Brace: Lumbar corset;Applied in sitting position Restrictions Weight Bearing Restrictions: No Mobility (including Balance) Bed Mobility Bed Mobility: Yes Rolling Left: 5: Supervision Rolling Left Details (indicate cue type and reason): cues for sequencing & reinforcement of back precautions Left Sidelying to Sit: 5: Supervision;HOB flat Left Sidelying to Sit Details (indicate cue type and reason): cues for sequencing/technique/reinforcement of back precautions Transfers Sit to Stand: 5: Supervision;With upper extremity assist;From bed Sit to Stand Details (indicate cue type and reason): cues for safe hand placement Stand to Sit: 5: Supervision Stand to Sit Details: cues for safe hand placement/technique Ambulation/Gait Ambulation/Gait Assistance: 5:  Supervision Ambulation/Gait Assistance Details (indicate cue type and reason): supervision for safety Ambulation Distance (Feet): 250 Feet Assistive device: Rolling walker Gait Pattern: Step-through pattern Stairs: Yes Stairs Assistance: Other (comment) (min guard) Stair Management Technique: One rail Right Number of Stairs: 3     Exercise    End of Session PT - End of Session Equipment Utilized During Treatment: Gait belt;Back brace Activity Tolerance: Patient tolerated treatment well Patient left: in chair;with call bell in reach (OT present) General Behavior During Session: [facility] for tasks performed Cognition: [facility] for tasks performed  Lara Mulch 03/03/2011, 1:41 PM Verdell Face, PTA [phone]

## 2011-03-03 NOTE — Progress Notes (Signed)
Subjective: Patient reports pain located in her back; improved from yesterday.  Abdominal pain from bloating and no bowel movement. No leg pain.  Objective: Vital signs in last 24 hours: Temp:  [97.6 F (36.4 C)-98.8 F (37.1 C)] 98.8 F (37.1 C) (11/19 0600) Pulse Rate:  [88-102] 88  (11/19 0600) Resp:  [16] 16  (11/19 0600) BP: (99-124)/(56-81) 112/72 mmHg (11/19 0600) SpO2:  [93 %-98 %] 95 % (11/19 0600)  Intake/Output from previous day:   Intake/Output this shift: Total I/O In: -  Out: 75 [Drains:75]  Wound:C/D/I  Lab Results: No results found for this basename: WBC:2,HGB:2,HCT:2,PLT:2 in the last 72 hours BMET No results found for this basename: NA:2,K:2,CL:2,CO2:2,GLUCOSE:2,BUN:2,CREATININE:2,CALCIUM:2 in the last 72 hours  Studies/Results: No results found.  Assessment/Plan: Work on bowels.  Doing well with PT.    LOS: 3 days     Sherry [name] D 03/03/2011, 9:48 AM

## 2011-03-03 NOTE — Progress Notes (Signed)
Occupational Therapy Treatment Patient Details Name: Sherry [name] MRN: [id number] DOB: 1946-01-28 Today's Date: 03/03/2011  Problem List:  Patient Active Problem List  Diagnoses  . ANXIETY  . DEPRESSION  . GERD  . CONSTIPATION  . FLATULENCE-GAS-BLOATING  . ABDOMINAL PAIN, UNSPECIFIED SITE  . RLQ PAIN  . ABNORMAL FINDINGS GI TRACT  . PERSONAL HX COLONIC POLYPS  . DEGENERATIVE DISC DISEASE, LUMBAR SPINE  . BACK PAIN, LUMBAR    Past Medical History:  Past Medical History  Diagnosis Date  . TMJ (dislocation of temporomandibular joint)   . Bronchitis   . Carpal tunnel syndrome     bilateral  . Arthritis   . Chronic back pain     herniated disc,stenosis,radiculopathy,spondylolisthesis  . Chronic neck pain   . Eczema   . Hiatal hernia   . Constipation, chronic   . Hemorrhoids   . IBS (irritable bowel syndrome)   . Anxiety     valium prn  . Depression    Past Surgical History:  Past Surgical History  Procedure Date  . Tonsillectomy     1951  . Appendectomy     1967  . Knee arthroscopy     1994  . Breast biopsy     and reduction in 1988  . Incision and drainage abscess / hematoma of bursa / knee / thigh     1994  . Bladder surgery     many yrs ago,but doesn't know what was actually done    OT Assessment/Plan/Recommendation OT Plan OT Frequency: Min 2X/week OT Recommendation Follow Up Recommendations: Home health OT Equipment Recommended: 3 in 1 bedside comode OT Goals ADL Goals ADL Goal: Grooming - Progress: Progressing toward goals Pt Will Perform Lower Body Bathing:  (demonstrated use of long handled sponge) ADL Goal: Lower Body Bathing - Progress: Met ADL Goal: Lower Body Dressing - Progress: Met ADL Goal: Toilet Transfer - Progress: Met ADL Goal: Tub/Shower Transfer - Progress: Met Miscellaneous OT Goals OT Goal: Miscellaneous Goal #1 - Progress: Progressing toward goals  OT Evaluation Precautions/Restrictions  Precautions Precautions:  Back;Fall Precaution Booklet Issued: Yes (comment) (Back precautions handout given) Precaution Comments: Instructed patient on 3 back precautions Required Braces or Orthoses: Yes Spinal Brace: Lumbar corset;Applied in sitting position Restrictions Weight Bearing Restrictions: No   ADL ADL Grooming: Performed;Supervision/safety Where Assessed - Grooming: Standing at sink Lower Body Bathing: Simulated;Supervision/safety Lower Body Bathing Details (indicate cue type and reason):  (demonstrated use of reacher and long handled sponge) Where Assessed - Lower Body Bathing: Sitting, chair;Unsupported;Sit to stand from chair Lower Body Dressing: Performed;Modified independent Where Assessed - Lower Body Dressing: Sit to stand from chair;Sitting, chair Toilet Transfer: Performed;Supervision/safety Toilet Transfer Details (indicate cue type and reason):  (3 in 1 over toilet) Toileting - Clothing Manipulation: Performed;Independent Where Assessed - Toileting Clothing Manipulation: Standing Toileting - Hygiene: Performed;Independent Where Assessed - Toileting Hygiene: Sit on 3-in-1 or toilet Tub/Shower Transfer: Performed;Supervision/safety Tub/Shower Transfer Details (indicate cue type and reason):  (onto 3 in 1) Tub/Shower Transfer Method: Science writer: Walk in shower (3 in 1) Equipment Used: Publishing copy Orientation Level: Oriented X4 Mobility  Transfers Sit to Stand: 5: Supervision;With armrests;From chair/3-in-1 Stand to Sit: 5: Supervision;With upper extremity assist;To chair/3-in-1 End of Session OT - End of Session Equipment Utilized During Treatment: Gait belt;Back brace Activity Tolerance: Patient tolerated treatment well Patient left: in chair;with call bell in reach General Behavior During Session: [facility] for tasks performed Cognition: [facility] for tasks performed  Evern Bio 03/03/2011, 11:24 AM [phone]

## 2011-03-04 MED ORDER — OXYCODONE-ACETAMINOPHEN 5-325 MG PO TABS: 1.0000 | ORAL_TABLET | ORAL | Status: AC | PRN

## 2011-03-04 MED ORDER — METHOCARBAMOL 500 MG PO TABS: 500.0000 mg | ORAL_TABLET | Freq: Three times a day (TID) | ORAL | Status: AC

## 2011-03-04 MED ORDER — FLEET ENEMA 7-19 GM/118ML RE ENEM
1.0000 | ENEMA | Freq: Once | RECTAL | Status: AC
  Administered 2011-03-04: 1 via RECTAL

## 2011-03-04 NOTE — Progress Notes (Signed)
Physical Therapy Treatment Patient Details Name: Sherry [name] MRN: [id number] DOB: Mar 06, 1946 Today's Date: 03/04/2011  PT Assessment/Plan  PT - Assessment/Plan Comments on Treatment Session: Pt states back pain seems to be a little worse today but still moving well.   PT Plan: Discharge plan remains appropriate PT Frequency: 7X/week Follow Up Recommendations: Home health PT;24 hour supervision/assistance Equipment Recommended: None recommended by PT PT Goals  Acute Rehab PT Goals PT Goal: Rolling Supine to Left Side - Progress: Progressing toward goal PT Goal: Supine/Side to Sit - Progress: Progressing toward goal PT Goal: Sit to Supine/Side - Progress: Progressing toward goal PT Transfer Goal: Sit to Stand/Stand to Sit - Progress: Met PT Goal: Ambulate - Progress: Met Additional Goals PT Goal: Additional Goal #1 - Progress: Met  PT Treatment Precautions/Restrictions  Precautions Precautions: Back Precaution Booklet Issued: Yes (comment) (Back precautions handout given) Precaution Comments: independently verbalized 3/3 back precautions.  Does well with maintaining adherence to back precautions with activity.   Required Braces or Orthoses: Yes Spinal Brace: Lumbar corset;Applied in sitting position Restrictions Weight Bearing Restrictions: No Mobility (including Balance) Bed Mobility Rolling Left: 6: Modified independent (Device/Increase time) Left Sidelying to Sit: 6: Modified independent (Device/Increase time) Sit to Supine - Left: 5: Supervision;HOB flat Sit to Supine - Left Details (indicate cue type and reason): cues for UE positioning/use, no twisiting, sequencing.   Transfers Sit to Stand: 6: Modified independent (Device/Increase time) Stand to Sit: 6: Modified independent (Device/Increase time) Ambulation/Gait Ambulation/Gait Assistance: 5: Supervision Ambulation/Gait Assistance Details (indicate cue type and reason): supervision for safety due to  pain Ambulation Distance (Feet): 300 Feet (more than) Assistive device: Rolling walker Gait Pattern: Step-through pattern    Exercise    End of Session PT - End of Session Equipment Utilized During Treatment: Gait belt;Back brace Activity Tolerance: Patient tolerated treatment well Patient left: in chair;with call bell in reach General Behavior During Session: [facility] for tasks performed Cognition: [facility] for tasks performed  Lara Mulch 03/04/2011, 11:23 AM [phone]

## 2011-03-04 NOTE — Discharge Summary (Signed)
Physician Discharge Summary  Patient ID: Sherry [name] MRN: [id number] DOB/AGE: 06/06/1945 65 y.o.  Admit date: 02/28/2011 Discharge date: 03/04/2011  Admission Diagnoses:  Discharge Diagnoses:  Active Problems: Lumbar stenosis, spondylolisthesis    Discharged Condition: good  Hospital Course: Uncomplicated decompression and fusion L3-5  Consults: none  Significant Diagnostic Studies: None  Treatments: surgery: Decompression and fusion L3-5  Discharge Exam: Blood pressure 94/50, pulse 90, temperature 98.1 F (36.7 C), temperature source Oral, resp. rate 19, height 5\' 5"  (1.651 m), weight 71.804 kg (158 lb 4.8 oz), SpO2 95.00%. Incision/Wound:C/D/I.  Full strength in both legs without numbness  Disposition: Home   Current Discharge Medication List    START taking these medications   Details  methocarbamol (ROBAXIN) 500 MG tablet Take 1 tablet (500 mg total) by mouth 3 (three) times daily. Qty: 60 tablet, Refills: 1    oxyCODONE-acetaminophen (PERCOCET) 5-325 MG per tablet Take 1-2 tablets by mouth every 4 (four) hours as needed for pain. Qty: 60 tablet, Refills: 0      CONTINUE these medications which have NOT CHANGED   Details  amitriptyline (ELAVIL) 25 MG tablet Take 1 tablet (25 mg total) by mouth at bedtime. Qty: 30 tablet, Refills: 5    doxycycline (VIBRAMYCIN) 100 MG capsule Take 100 mg by mouth daily.    ergocalciferol (VITAMIN D2) 50000 UNITS capsule Take 50,000 mg by mouth once a week.     Estradiol-Norethindrone Acet 0.5-0.1 MG per tablet Take 1 tablet by mouth daily.    KENALOG topical spray Apply 1 g topically as needed. Apply to affected area as needed    ketoconazole (NIZORAL) 2 % shampoo Apply 1 application topically once a week.     methylphenidate (CONCERTA) 27 MG CR tablet Take 27 mg by mouth as needed. For attention deficit disorder    OVER THE COUNTER MEDICATION Take 1 tablet by mouth daily. FIBER SUPPLEMENT TABLET (OTC)       Polyethyl Glycol-Propyl Glycol (SYSTANE) 0.4-0.3 % SOLN Place 1 drop into both eyes daily as needed. For dry eyes     polyethylene glycol (MIRALAX / GLYCOLAX) packet Take 17 g by mouth daily as needed. For constipation      traMADol (ULTRAM) 50 MG tablet TAKE 1 TABLET BY MOUTH 3 TIMES A DAY AS NEEDED FOR PAIN Qty: 90 tablet, Refills: 1    triamcinolone (NASACORT AQ) 55 MCG/ACT nasal inhaler Place 2 sprays into the nose daily as needed. congestion     zolpidem (AMBIEN) 10 MG tablet Take 3 mg by mouth At bedtime.     HYDROcodone-acetaminophen (VICODIN) 5-500 MG per tablet Take 1 tablet by mouth every 4 (four) hours as needed for pain. Qty: 60 tablet, Refills: 0      STOP taking these medications     diazepam (VALIUM) 2 MG tablet      docusate sodium (COLACE) 100 MG capsule          Signed: Kamri Gotsch D 03/04/2011, 10:03 AM

## 2011-03-04 NOTE — Progress Notes (Signed)
MET WITH PT TO DISCUSS DC PLANS.  HOME HEALTH ARRANGED WITH [facility], PER REQUEST. 3 IN 1 TO BE DELIVERED TO PT'S HOME, PER [facility] REP.  START OF CARE 24-48H POST DC DATE.   Jerrell Belfast, RN, BSN Pager # [phone]

## 2011-03-05 ENCOUNTER — Emergency Department (HOSPITAL_COMMUNITY)
Admission: EM | Admit: 2011-03-05 | Discharge: 2011-03-06 | Disposition: A | Discharge status: Home / Self Care | Payer: BC Managed Care – PPO | Attending: Emergency Medicine | Admitting: Emergency Medicine

## 2011-03-05 ENCOUNTER — Encounter (HOSPITAL_COMMUNITY): Payer: Self-pay

## 2011-03-05 DIAGNOSIS — Z79899 Other long term (current) drug therapy: Secondary | ICD-10-CM | POA: Insufficient documentation

## 2011-03-05 DIAGNOSIS — Z981 Arthrodesis status: Secondary | ICD-10-CM | POA: Insufficient documentation

## 2011-03-05 DIAGNOSIS — K5900 Constipation, unspecified: Secondary | ICD-10-CM | POA: Insufficient documentation

## 2011-03-05 MED ORDER — LIDOCAINE VISCOUS 2 % MT SOLN
20.0000 mL | Freq: Once | OROMUCOSAL | Status: DC
  Filled 2011-03-05: qty 20

## 2011-03-05 MED ORDER — LIDOCAINE HCL 2 % EX GEL
Freq: Once | CUTANEOUS | Status: AC
  Administered 2011-03-05: via TOPICAL
  Filled 2011-03-05: qty 10

## 2011-03-05 MED FILL — Sodium Chloride Irrigation Soln 0.9%: Qty: 3000 | Status: AC

## 2011-03-05 MED FILL — Heparin Sodium (Porcine) Inj 1000 Unit/ML: INTRAMUSCULAR | Qty: 30 | Status: AC

## 2011-03-05 MED FILL — Sodium Chloride IV Soln 0.9%: INTRAVENOUS | Qty: 1000 | Status: AC

## 2011-03-05 NOTE — ED Notes (Signed)
Pt c/o no BM x6 days, last BM Friday am just before surgery, hx of chronic IBS w/constipation

## 2011-03-05 NOTE — ED Notes (Signed)
No Bm x 6 days.  S/p lumbar laminectomy 11/16.  Has been using enemas, suppositories, oral laxatives w/o success.

## 2011-03-06 NOTE — ED Provider Notes (Signed)
History     CSN: [id number] Arrival date & time: 03/05/2011  9:37 PM   First MD Initiated Contact with Patient 03/05/11 2300      Chief Complaint  Patient presents with  . Constipation    poss fecal impact     Patient is a 65 y.o. female presenting with constipation. The history is provided by the patient.  Constipation  The current episode started 5 to 7 days ago. The onset was gradual. The problem occurs continuously. The problem has been unchanged. The patient is experiencing no pain. Prior unsuccessful therapies include fiber, stool softeners, laxatives and enemas. Pertinent negatives include no anorexia, no fever, no abdominal pain, no hematemesis, no nausea and no vomiting. Urine output has been normal.   Pt with recent spinal fusion - no complications, pain improved Now having constipation since surgery No focal leg weakness No urinary retention Reports minimal flatus No abd pain Past Medical History  Diagnosis Date  . TMJ (dislocation of temporomandibular joint)   . Bronchitis   . Carpal tunnel syndrome     bilateral  . Arthritis   . Chronic back pain     herniated disc,stenosis,radiculopathy,spondylolisthesis  . Chronic neck pain   . Eczema   . Hiatal hernia   . Constipation, chronic   . Hemorrhoids   . IBS (irritable bowel syndrome)   . Anxiety     valium prn  . Depression     Past Surgical History  Procedure Date  . Tonsillectomy     1951  . Appendectomy     1967  . Knee arthroscopy     1994  . Breast biopsy     and reduction in 1988  . Incision and drainage abscess / hematoma of bursa / knee / thigh     1994  . Bladder surgery     many yrs ago,but doesn't know what was actually done  . Back surgery     Family History  Problem Relation Age of Onset  . Anesthesia problems Neg Hx   . Hypotension Neg Hx   . Malignant hyperthermia Neg Hx   . Pseudochol deficiency Neg Hx     History  Substance Use Topics  . Smoking status: Former Games developer    . Smokeless tobacco: Never Used  . Alcohol Use: No     none at present;    OB History    Grav Para Term Preterm Abortions TAB SAB Ect Mult Living                  Review of Systems  Constitutional: Negative for fever.  Gastrointestinal: Positive for constipation. Negative for nausea, vomiting, abdominal pain, anorexia and hematemesis.  All other systems reviewed and are negative.    Allergies  Adhesive  Home Medications   Current Outpatient Rx  Name Route Sig Dispense Refill  . DOXYCYCLINE HYCLATE 100 MG PO CAPS Oral Take 100 mg by mouth daily.    . ERGOCALCIFEROL 50000 UNITS PO CAPS Oral Take 50,000 mg by mouth once a week.     Marland Kitchen ESTRADIOL-NORETHINDRONE ACET 0.5-0.1 MG PO TABS Oral Take 1 tablet by mouth daily.    Marland Kitchen HYDROCODONE-ACETAMINOPHEN 5-500 MG PO TABS Oral Take 1 tablet by mouth every 4 (four) hours as needed for pain. 60 tablet 0  . KENALOG EX AERS Topical Apply 1 g topically as needed. Apply to affected area as needed    . KETOCONAZOLE 2 % EX SHAM Topical Apply 1 application topically once a  week.     . METHOCARBAMOL 500 MG PO TABS Oral Take 1 tablet (500 mg total) by mouth 3 (three) times daily. 60 tablet 1  . METHYLPHENIDATE HCL 27 MG PO TBCR Oral Take 27 mg by mouth as needed. For attention deficit disorder    . OXYCODONE-ACETAMINOPHEN 5-325 MG PO TABS Oral Take 1-2 tablets by mouth every 4 (four) hours as needed for pain. 60 tablet 0  . POLYETHYL GLYCOL-PROPYL GLYCOL 0.4-0.3 % OP SOLN Both Eyes Place 1 drop into both eyes daily as needed. For dry eyes     . POLYETHYLENE GLYCOL 3350 PO PACK Oral Take 17 g by mouth daily as needed. For constipation      . PSYLLIUM 58.6 % PO PACK Oral Take 1 packet by mouth daily.      . TRAMADOL HCL 50 MG PO TABS  TAKE 1 TABLET BY MOUTH 3 TIMES A DAY AS NEEDED FOR PAIN 90 tablet 1  . TRIAMCINOLONE ACETONIDE 55 MCG/ACT NA INHA Nasal Place 2 sprays into the nose daily as needed. congestion     . ZOLPIDEM TARTRATE 10 MG PO TABS Oral  Take 3 mg by mouth At bedtime.     Marland Kitchen AMITRIPTYLINE HCL 25 MG PO TABS Oral Take 1 tablet (25 mg total) by mouth at bedtime. 30 tablet 5  . OVER THE COUNTER MEDICATION Oral Take 1 tablet by mouth daily. FIBER SUPPLEMENT TABLET (OTC)      BP 136/76  Pulse 103  Temp(Src) 97.6 F (36.4 C) (Oral)  Resp 17  Ht 5' 5.5" (1.664 m)  Wt 140 lb (63.504 kg)  BMI 22.94 kg/m2  SpO2 100%  Physical Exam  CONSTITUTIONAL: Well developed/well nourished HEAD AND FACE: Normocephalic/atraumatic EYES: EOMI/PERRL ENMT: Mucous membranes moist NECK: supple no meningeal signs SPINE:pt in brace, wound does not appear to have drainage/erythema CV: S1/S2 noted, no murmurs/rubs/gallops noted LUNGS: Lungs are clear to auscultation bilaterally, no apparent distress ABDOMEN: soft, nontender, no rebound or guarding, +BS Rectal - stool in vault, no melena NEURO: Pt is awake/alert, moves all extremitiesx4 EXTREMITIES: pulses normal, full ROM SKIN: warm, color normal PSYCH: no abnormalities of mood noted   ED Course  Fecal disimpaction Performed by: Joya Gaskins Authorized by: Joya Gaskins Consent: Verbal consent obtained. Risks and benefits: risks, benefits and alternatives were discussed Consent given by: patient Patient understanding: patient states understanding of the procedure being performed Local anesthesia used: yes Anesthesia method: lidcaine gel. Patient sedated: no Patient tolerance: Patient tolerated the procedure well with no immediate complications. Comments: Chaperone present Stool extracted, patient tolerated well       1. Constipation       MDM  Nursing notes reviewed and considered in documentation The patient appears reasonably screened and/or stabilized for discharge and I doubt any other medical condition or other [facility] requiring further screening, evaluation, or treatment in the ED at this time prior to discharge.   Pt well appearing, no distress, will continue  to try home remedies for constipation I doubt acute abd process at this time          Joya Gaskins, MD 03/06/11 0134

## 2011-04-11 ENCOUNTER — Other Ambulatory Visit: Payer: Self-pay | Admitting: Obstetrics and Gynecology

## 2011-04-11 DIAGNOSIS — Z1231 Encounter for screening mammogram for malignant neoplasm of breast: Secondary | ICD-10-CM

## 2011-05-27 ENCOUNTER — Ambulatory Visit (HOSPITAL_COMMUNITY): Payer: BC Managed Care – PPO

## 2011-06-12 ENCOUNTER — Ambulatory Visit (HOSPITAL_COMMUNITY): Payer: BC Managed Care – PPO

## 2011-06-26 ENCOUNTER — Ambulatory Visit (HOSPITAL_COMMUNITY): Payer: BC Managed Care – PPO

## 2011-07-14 ENCOUNTER — Ambulatory Visit (HOSPITAL_COMMUNITY)
Admission: RE | Admit: 2011-07-14 | Discharge: 2011-07-14 | Disposition: A | Discharge status: Home / Self Care | Payer: BC Managed Care – PPO | Source: Ambulatory Visit | Attending: Obstetrics and Gynecology | Admitting: Obstetrics and Gynecology

## 2011-07-14 DIAGNOSIS — Z1231 Encounter for screening mammogram for malignant neoplasm of breast: Secondary | ICD-10-CM | POA: Insufficient documentation

## 2011-09-30 ENCOUNTER — Other Ambulatory Visit: Payer: Self-pay | Admitting: Internal Medicine

## 2011-09-30 ENCOUNTER — Encounter (HOSPITAL_COMMUNITY): Payer: Self-pay | Admitting: General Practice

## 2011-09-30 ENCOUNTER — Ambulatory Visit
Admission: RE | Admit: 2011-09-30 | Discharge: 2011-09-30 | Disposition: A | Discharge status: Home / Self Care | Payer: BC Managed Care – PPO | Source: Ambulatory Visit | Attending: Internal Medicine | Admitting: Internal Medicine

## 2011-09-30 ENCOUNTER — Inpatient Hospital Stay (HOSPITAL_COMMUNITY)
Admission: AD | Admit: 2011-09-30 | Discharge: 2011-10-03 | DRG: 813 | Disposition: A | Discharge status: Home / Self Care | Payer: BC Managed Care – PPO | Source: Ambulatory Visit | Attending: Internal Medicine | Admitting: Internal Medicine

## 2011-09-30 DIAGNOSIS — Z981 Arthrodesis status: Secondary | ICD-10-CM

## 2011-09-30 DIAGNOSIS — K559 Vascular disorder of intestine, unspecified: Secondary | ICD-10-CM | POA: Diagnosis present

## 2011-09-30 DIAGNOSIS — M549 Dorsalgia, unspecified: Secondary | ICD-10-CM | POA: Diagnosis present

## 2011-09-30 DIAGNOSIS — K5289 Other specified noninfective gastroenteritis and colitis: Secondary | ICD-10-CM

## 2011-09-30 DIAGNOSIS — R109 Unspecified abdominal pain: Secondary | ICD-10-CM

## 2011-09-30 DIAGNOSIS — K5900 Constipation, unspecified: Secondary | ICD-10-CM | POA: Diagnosis present

## 2011-09-30 DIAGNOSIS — G8929 Other chronic pain: Secondary | ICD-10-CM | POA: Diagnosis present

## 2011-09-30 DIAGNOSIS — Z79899 Other long term (current) drug therapy: Secondary | ICD-10-CM

## 2011-09-30 DIAGNOSIS — A09 Infectious gastroenteritis and colitis, unspecified: Principal | ICD-10-CM | POA: Diagnosis present

## 2011-09-30 DIAGNOSIS — Z780 Asymptomatic menopausal state: Secondary | ICD-10-CM

## 2011-09-30 DIAGNOSIS — E871 Hypo-osmolality and hyponatremia: Secondary | ICD-10-CM | POA: Diagnosis present

## 2011-09-30 DIAGNOSIS — F341 Dysthymic disorder: Secondary | ICD-10-CM | POA: Diagnosis present

## 2011-09-30 DIAGNOSIS — Z87891 Personal history of nicotine dependence: Secondary | ICD-10-CM

## 2011-09-30 DIAGNOSIS — K589 Irritable bowel syndrome without diarrhea: Secondary | ICD-10-CM | POA: Diagnosis present

## 2011-09-30 HISTORY — DX: Personal history of other diseases of the respiratory system: Z87.09

## 2011-09-30 HISTORY — DX: Gastro-esophageal reflux disease without esophagitis: K21.9

## 2011-09-30 LAB — COMPREHENSIVE METABOLIC PANEL
ALT: 19 U/L (ref 0–35)
AST: 26 U/L (ref 0–37)
Albumin: 3.9 g/dL (ref 3.5–5.2)
Alkaline Phosphatase: 63 U/L (ref 39–117)
BUN: 12 mg/dL (ref 6–23)
CO2: 24 mEq/L (ref 19–32)
Calcium: 9.1 mg/dL (ref 8.4–10.5)
Chloride: 95 mEq/L — ABNORMAL LOW (ref 96–112)
Creatinine, Ser: 0.61 mg/dL (ref 0.50–1.10)
GFR calc Af Amer: >90 mL/min (ref >90)
GFR calc non Af Amer: >90 mL/min (ref >90)
Glucose, Bld: 129 mg/dL — ABNORMAL HIGH (ref 70–99)
Potassium: 4.3 mEq/L (ref 3.5–5.1)
Sodium: 129 mEq/L — ABNORMAL LOW (ref 135–145)
Total Bilirubin: 0.6 mg/dL (ref 0.3–1.2)
Total Protein: 6.1 g/dL (ref 6.0–8.3)

## 2011-09-30 LAB — PROTIME-INR
INR: 0.99 (ref 0.00–1.49)
Prothrombin Time: 13.3 seconds (ref 11.6–15.2)

## 2011-09-30 LAB — DIFFERENTIAL
Basophils Absolute: 0 10*3/uL (ref 0.0–0.1)
Basophils Relative: 0 % (ref 0–1)
Eosinophils Absolute: 0 10*3/uL (ref 0.0–0.7)
Eosinophils Relative: 0 % (ref 0–5)
Lymphocytes Relative: 8 % — ABNORMAL LOW (ref 12–46)
Lymphs Abs: 0.6 10*3/uL — ABNORMAL LOW (ref 0.7–4.0)
Monocytes Absolute: 0.6 10*3/uL (ref 0.1–1.0)
Monocytes Relative: 8 % (ref 3–12)
Neutro Abs: 6.5 10*3/uL (ref 1.7–7.7)
Neutrophils Relative %: 84 % — ABNORMAL HIGH (ref 43–77)

## 2011-09-30 LAB — CBC
HCT: 43.4 % (ref 36.0–46.0)
Hemoglobin: 14.9 g/dL (ref 12.0–15.0)
MCH: 32.2 pg (ref 26.0–34.0)
MCHC: 34.3 g/dL (ref 30.0–36.0)
MCV: 93.7 fL (ref 78.0–100.0)
Platelets: 230 10*3/uL (ref 150–400)
RBC: 4.63 MIL/uL (ref 3.87–5.11)
RDW: 13 % (ref 11.5–15.5)
WBC: 7.7 10*3/uL (ref 4.0–10.5)

## 2011-09-30 MED ORDER — ZOLPIDEM TARTRATE 5 MG PO TABS
2.5000 mg | ORAL_TABLET | Freq: Every evening | ORAL | Status: DC | PRN
  Administered 2011-10-02 (×2): 2.5 mg via ORAL
  Filled 2011-09-30 (×2): qty 1

## 2011-09-30 MED ORDER — PANTOPRAZOLE SODIUM 40 MG PO TBEC
40.0000 mg | DELAYED_RELEASE_TABLET | Freq: Every day | ORAL | Status: DC
  Administered 2011-09-30 – 2011-10-03 (×4): 40 mg via ORAL
  Filled 2011-09-30 (×3): qty 1

## 2011-09-30 MED ORDER — MORPHINE SULFATE 2 MG/ML IJ SOLN
1.0000 mg | INTRAMUSCULAR | Status: DC | PRN
  Administered 2011-09-30 – 2011-10-03 (×7): 1 mg via INTRAVENOUS
  Filled 2011-09-30 (×7): qty 1

## 2011-09-30 MED ORDER — CIPROFLOXACIN IN D5W 400 MG/200ML IV SOLN
400.0000 mg | Freq: Two times a day (BID) | INTRAVENOUS | Status: DC
  Administered 2011-09-30 – 2011-10-01 (×3): 400 mg via INTRAVENOUS
  Filled 2011-09-30 (×5): qty 200

## 2011-09-30 MED ORDER — VITAMIN D (ERGOCALCIFEROL) 1.25 MG (50000 UNIT) PO CAPS
50000.0000 [IU] | ORAL_CAPSULE | ORAL | Status: DC
  Administered 2011-09-30: 50000 [IU] via ORAL
  Filled 2011-09-30 (×2): qty 1

## 2011-09-30 MED ORDER — POLYVINYL ALCOHOL 1.4 % OP SOLN
1.0000 [drp] | OPHTHALMIC | Status: DC | PRN
  Filled 2011-09-30: qty 15

## 2011-09-30 MED ORDER — ACETAMINOPHEN 650 MG RE SUPP: 650.0000 mg | Freq: Four times a day (QID) | RECTAL | Status: DC | PRN

## 2011-09-30 MED ORDER — ESTRADIOL-NORETHINDRONE ACET 0.5-0.1 MG PO TABS
1.0000 | ORAL_TABLET | Freq: Every day | ORAL | Status: DC
  Administered 2011-10-01: 1 via ORAL
  Filled 2011-09-30 (×3): qty 1

## 2011-09-30 MED ORDER — ALUM & MAG HYDROXIDE-SIMETH 200-200-20 MG/5ML PO SUSP
30.0000 mL | ORAL | Status: DC | PRN
  Administered 2011-09-30: 30 mL via ORAL
  Filled 2011-09-30 (×2): qty 30

## 2011-09-30 MED ORDER — POLYETHYL GLYCOL-PROPYL GLYCOL 0.4-0.3 % OP SOLN: 1.0000 [drp] | Freq: Every day | OPHTHALMIC | Status: DC | PRN

## 2011-09-30 MED ORDER — ACETAMINOPHEN 325 MG PO TABS
650.0000 mg | ORAL_TABLET | Freq: Four times a day (QID) | ORAL | Status: DC | PRN
  Administered 2011-09-30 – 2011-10-02 (×4): 650 mg via ORAL
  Filled 2011-09-30 (×3): qty 2
  Filled 2011-09-30: qty 1
  Filled 2011-09-30: qty 2

## 2011-09-30 MED ORDER — METRONIDAZOLE IN NACL 5-0.79 MG/ML-% IV SOLN
500.0000 mg | Freq: Four times a day (QID) | INTRAVENOUS | Status: DC
  Administered 2011-09-30 – 2011-10-02 (×6): 500 mg via INTRAVENOUS
  Filled 2011-09-30 (×7): qty 100

## 2011-09-30 MED ORDER — HEPARIN SODIUM (PORCINE) 5000 UNIT/ML IJ SOLN
5000.0000 [IU] | Freq: Three times a day (TID) | INTRAMUSCULAR | Status: DC
  Administered 2011-09-30 – 2011-10-02 (×5): 5000 [IU] via SUBCUTANEOUS
  Filled 2011-09-30 (×11): qty 1

## 2011-09-30 MED ORDER — DEXTROSE-NACL 5-0.45 % IV SOLN
INTRAVENOUS | Status: DC
  Administered 2011-09-30 – 2011-10-01 (×3): via INTRAVENOUS

## 2011-09-30 MED ORDER — ONDANSETRON HCL 4 MG PO TABS: 4.0000 mg | ORAL_TABLET | Freq: Four times a day (QID) | ORAL | Status: DC | PRN

## 2011-09-30 MED ORDER — POLYETHYLENE GLYCOL 3350 17 G PO PACK
17.0000 g | PACK | Freq: Every day | ORAL | Status: DC | PRN
  Administered 2011-10-01 – 2011-10-02 (×2): 17 g via ORAL
  Filled 2011-09-30: qty 1

## 2011-09-30 MED ORDER — ONDANSETRON HCL 4 MG/2ML IJ SOLN
4.0000 mg | Freq: Four times a day (QID) | INTRAMUSCULAR | Status: DC | PRN
  Administered 2011-10-01: 4 mg via INTRAVENOUS
  Filled 2011-09-30: qty 2

## 2011-09-30 MED ORDER — IOHEXOL 300 MG/ML  SOLN
100.0000 mL | Freq: Once | INTRAMUSCULAR | Status: AC | PRN
  Administered 2011-09-30: 100 mL via INTRAVENOUS

## 2011-09-30 NOTE — Progress Notes (Signed)
Pt arrived to 6700 as a direct admit. Pt is alert and oriented, ambulatory. VSS. No signs of acute distress. 5/10 cramping abdominal pain. Pt oriented to room. Bed in low position. Call bell within reach.

## 2011-09-30 NOTE — Consult Note (Signed)
Reason for Consult:Abdominal pain and possible bowel ischemia Referring Physician: [name] is an 66 y.o. female.  HPI: Patient developed crampy midabdominal pain this morning. It was associated with nausea. She has not had any change in her bowel habits. She usually has some degree of constipation. She has a history of IBD. She recently traveled out of town. She reports that her stomach is often sensitive after travel. She has been careful about what she. Due to the pain earlier today, she contacted Dr. Earl Gala and was evaluated in his office. He obtained a CT scan of the abdomen and pelvis. It demonstrates enteritis involving small bowel loops.This could be ischemic or infectious. I was asked to evaluate from a surgery standpoint.  Past Medical History  Diagnosis Date  . TMJ (dislocation of temporomandibular joint)   . Bronchitis   . Carpal tunnel syndrome     bilateral  . Chronic back pain     herniated disc,stenosis,radiculopathy,spondylolisthesis  . Chronic neck pain   . Eczema   . Hiatal hernia   . Constipation, chronic   . Hemorrhoids   . IBS (irritable bowel syndrome)   . Anxiety     valium prn  . Depression   . History of bronchitis     "I've had it a number of times"  . GERD (gastroesophageal reflux disease)   . Arthritis     "little in my fingers; shoulders"  . Enteritis 09/30/11    "localized into one section of small bowel"    Past Surgical History  Procedure Date  . Tonsillectomy     1951  . Appendectomy     1967  . Knee arthroscopy 1994    "don't remember which side"  . Incision and drainage abscess / hematoma of bursa / knee / thigh 1994    "day after knee scope"  . Back surgery   . Posterior fusion lumbar spine 02/2011    L3-L5  . Dilation and curettage of uterus 1988  . Breast reduction surgery 1988  . Breast biopsy 1980's    "twice on the left"  . Bladder suspension 1980's    "wasn't successful"    Family History  Problem  Relation Age of Onset  . Anesthesia problems Neg Hx   . Hypotension Neg Hx   . Malignant hyperthermia Neg Hx   . Pseudochol deficiency Neg Hx     Social History:  reports that she quit smoking about 43 years ago. Her smoking use included Cigarettes. She quit after 4 years of use. She has never used smokeless tobacco. She reports that she drinks about 3.5 ounces of alcohol per week. She reports that she does not use illicit drugs.  Allergies:  Allergies  Allergen Reactions  . Adhesive (Tape) Other (See Comments)    Blisters; "gets puffy and red"    Medications: I have reviewed the patient's current medications.  Results for orders placed during the hospital encounter of 09/30/11 (from the past 48 hour(s))  CBC     Status: Normal   Collection Time   09/30/11  6:32 PM      Component Value Range Comment   WBC 7.7  4.0 - 10.5 K/uL    RBC 4.63  3.87 - 5.11 MIL/uL    Hemoglobin 14.9  12.0 - 15.0 g/dL    HCT 95.6  21.3 - 08.6 %    MCV 93.7  78.0 - 100.0 fL    MCH 32.2  26.0 - 34.0 pg  MCHC 34.3  30.0 - 36.0 g/dL    RDW 16.1  09.6 - 04.5 %    Platelets 230  150 - 400 K/uL   COMPREHENSIVE METABOLIC PANEL     Status: Abnormal   Collection Time   09/30/11  6:32 PM      Component Value Range Comment   Sodium 129 (*) 135 - 145 mEq/L    Potassium 4.3  3.5 - 5.1 mEq/L    Chloride 95 (*) 96 - 112 mEq/L    CO2 24  19 - 32 mEq/L    Glucose, Bld 129 (*) 70 - 99 mg/dL    BUN 12  6 - 23 mg/dL    Creatinine, Ser 4.09  0.50 - 1.10 mg/dL    Calcium 9.1  8.4 - 81.1 mg/dL    Total Protein 6.1  6.0 - 8.3 g/dL    Albumin 3.9  3.5 - 5.2 g/dL    AST 26  0 - 37 U/L HEMOLYSIS AT THIS LEVEL MAY AFFECT RESULT   ALT 19  0 - 35 U/L    Alkaline Phosphatase 63  39 - 117 U/L    Total Bilirubin 0.6  0.3 - 1.2 mg/dL    GFR calc non Af Amer >90  >90 mL/min    GFR calc Af Amer >90  >90 mL/min   DIFFERENTIAL     Status: Abnormal   Collection Time   09/30/11  6:32 PM      Component Value Range Comment    Neutrophils Relative 84 (*) 43 - 77 %    Neutro Abs 6.5  1.7 - 7.7 K/uL    Lymphocytes Relative 8 (*) 12 - 46 %    Lymphs Abs 0.6 (*) 0.7 - 4.0 K/uL    Monocytes Relative 8  3 - 12 %    Monocytes Absolute 0.6  0.1 - 1.0 K/uL    Eosinophils Relative 0  0 - 5 %    Eosinophils Absolute 0.0  0.0 - 0.7 K/uL    Basophils Relative 0  0 - 1 %    Basophils Absolute 0.0  0.0 - 0.1 K/uL   PROTIME-INR     Status: Normal   Collection Time   09/30/11  6:32 PM      Component Value Range Comment   Prothrombin Time 13.3  11.6 - 15.2 seconds    INR 0.99  0.00 - 1.49     Ct Abdomen Pelvis W Contrast  09/30/2011  *RADIOLOGY REPORT*  Clinical Data: Mid abdominal pain today, nausea, prior appendectomy  CT ABDOMEN AND PELVIS WITH CONTRAST  Technique:  Multidetector CT imaging of the abdomen and pelvis was performed following the standard protocol during bolus administration of intravenous contrast.  Contrast: OMNIPAQUE IOHEXOL 300 MG/ML  SOLN  Comparison: CT abdomen and pelvis of 04/04/1999 and  Findings: Two 3 mm noncalcified nodules are present in the right lower lobe, not definitely included on the prior study through the lung bases. However, in review of the CT chest from November 2010 these nodules are completely stable and consistent with a benign process.  There is a small hiatal hernia present.  A few low attenuation liver lesions are present most consistent with cysts.  No ductal dilatation is seen.  No calcified gallstones are noted.  The pancreas is normal in size and the pancreatic duct is not dilated. The adrenal glands and spleen are unremarkable.  The stomach is moderately fluid distended with no abnormality evident.  The  kidneys enhance with no evidence of calculus and no renal mass is seen.  The pelvocaliceal systems are unremarkable.  The abdominal aorta is normal in caliber with atheromatous change present. The mesenteric vasculature appears normal.  However, within the mid and upper pelvis there  are abnormal small bowel loops present which demonstrate diffuse mucosal edema.  The terminal ileum is unremarkable. The primary considerations are that of an infectious or inflammatory enteritis versus an ischemic process. A small amount of fluid is noted within the pelvis.  The uterus is normal in size.    No abnormality of the colon is seen. Hardware for posterior fusion of L3-4 and L4-5 is noted.  IMPRESSION:  1.  Edema of the small bowel loops within the mid and upper pelvis. Consider an infectious or inflammatory enteritis versus an ischemic process. 2.  Small hiatal hernia. 3.  Stable benign appearing small nodules at the right lung base.  Original Report Authenticated By: Juline Patch, M.D.    Review of Systems  Constitutional: Negative.   Eyes: Negative.   Respiratory: Negative.   Cardiovascular: Negative.   Gastrointestinal: Positive for heartburn, nausea, abdominal pain and constipation. Negative for vomiting, diarrhea, blood in stool and melena.  Genitourinary: Negative.   Musculoskeletal: Positive for back pain.  Skin: Negative.   Neurological: Positive for headaches.   Blood pressure 132/77, pulse 92, temperature 98 F (36.7 C), temperature source Oral, resp. rate 20, SpO2 98.00%. Physical Exam  Constitutional: She is oriented to person, place, and time. She appears well-developed and well-nourished. No distress.  HENT:  Head: Normocephalic and atraumatic.  Mouth/Throat: No oropharyngeal exudate.  Eyes: Pupils are equal, round, and reactive to light. No scleral icterus.  Neck: Normal range of motion. No tracheal deviation present. No thyromegaly present.  Cardiovascular: Normal rate, regular rhythm, normal heart sounds and intact distal pulses.   No murmur heard. Respiratory: Effort normal and breath sounds normal. No stridor. No respiratory distress. She has no wheezes. She has no rales.  GI: Soft. She exhibits distension. There is tenderness. There is no rebound and no  guarding.       Abdomen is soft but mildly distended. There is mild central abdominal tenderness without guarding or peritoneal signs. Bowel sounds are very diminished.  Musculoskeletal: Normal range of motion.  Neurological: She is alert and oriented to person, place, and time.       Speech clear and fluent  Skin: Skin is warm and dry.    Assessment/Plan: Enteritis: Ischemic versus infectious. She does not have a lot of abdominal vascular atherosclerosis. Recommendations include continuing bowel rest and IV fluid resuscitation. I will begin ciprofloxacin and Flagyl to cover for enteric bacteria. I have added Maalox and Protonix for her heartburn currently. Her exam and CT findings do not warrant exploration at this time, however we will follow her closely for signs of worsening. I discussed the patient's disease process with her in detail and answered her questions.  Donnetta Gillin E 09/30/2011, 7:57 PM

## 2011-09-30 NOTE — H&P (Signed)
Hospital Admission Note Date: 09/30/2011  Patient name: Sherry [name] Medical record number: [id number] Date of birth: 05/25/1945 Age: 66 y.o. Gender: female PCP: No primary provider on file.  Attending physician: Darnelle Bos, MD  Chief Complaint: abdominal pain  History of Present Illness: Sherry [name] is an 66 y.o. female who has a history of IBS and has usually been associated with pain and constipation. She awoke this morning with pain about 0600. The pain seems to be in the middle and she feels bloated. She has had a couple of stools. She is burping a lot. She has very little flatus. She has been a bit nauseated. She is not really anorectic but she is not really hungry. The pain is crampy in nature.  She was seen in the office about 12 noon. Her exam revealed bruits and no bowel sounds. She was minimal tender in the periumbilical area. She had a CT scan done after labs in our office showed normal electrolytes. Hb was 15.0 (her usual is about 14.2) and her WBC was 8.6k with some increase in PMNs. A CT scan was ordered.    Past Medical History  Diagnosis Date  . TMJ (dislocation of temporomandibular joint)   . Bronchitis   . Carpal tunnel syndrome     bilateral  . Arthritis   . Chronic back pain     herniated disc,stenosis,radiculopathy,spondylolisthesis  . Chronic neck pain   . Eczema   . Hiatal hernia   . Constipation, chronic   . Hemorrhoids   . IBS (irritable bowel syndrome)   . Anxiety     valium prn  . Depression    Meds: Activella 1-.05 daily.  Ambien 10 mg 1/3 tablet nightly prn Miralax prn Nasacort AQ prn Vit D 50,000 IU weekly  Allergies: Adhesive History   Social History  . Marital Status: Married    Spouse Name: N/A    Number of Children: N/A  . Years of Education: N/A   Occupational History  . Not on file.   Social History Main Topics  . Smoking status: Former Games developer  . Smokeless tobacco: Never Used  . Alcohol Use: No     none  at present;  . Drug Use: No  . Sexually Active: Yes    Birth Control/ Protection: Post-menopausal   Other Topics Concern  . Not on file   Social History Narrative  . No narrative on file   Family History  Problem Relation Age of Onset  . Anesthesia problems Neg Hx   . Hypotension Neg Hx   . Malignant hyperthermia Neg Hx   . Pseudochol deficiency Neg Hx    Past Surgical History  Procedure Date  . Tonsillectomy     1951  . Appendectomy     1967  . Knee arthroscopy     1994  . Breast biopsy     and reduction in 1988  . Incision and drainage abscess / hematoma of bursa / knee / thigh     1994  . Bladder surgery     many yrs ago,but doesn't know what was actually done  . Back surgery    Review of Systems: Pertinent items are noted in HPI. Physical Exam: There were no vitals taken for this visit. General appearance: alert and no distress Neck: thyroid not enlarged, symmetric, no tenderness/mass/nodules Lungs: clear to auscultation bilaterally Heart: regular rate and rhythm, S1, S2 normal, no murmur, click, rub or gallop Abdomen: abnormal findings:  absent bowel sounds  and bruits and mild periumibilical tenderness Neurologic: Alert and oriented X 3, normal strength and tone. Normal symmetric reflexes. Normal coordination and gait  Lab results: see comments in HPI  Imaging results:  Ct Abdomen Pelvis W Contrast  09/30/2011  *RADIOLOGY REPORT*  Clinical Data: Mid abdominal pain today, nausea, prior appendectomy  CT ABDOMEN AND PELVIS WITH CONTRAST  Technique:  Multidetector CT imaging of the abdomen and pelvis was performed following the standard protocol during bolus administration of intravenous contrast.  Contrast: OMNIPAQUE IOHEXOL 300 MG/ML  SOLN  Comparison: CT abdomen and pelvis of 04/04/1999 and  Findings: Two 3 mm noncalcified nodules are present in the right lower lobe, not definitely included on the prior study through the lung bases. However, in review of the  CT chest from November 2010 these nodules are completely stable and consistent with a benign process.  There is a small hiatal hernia present.  A few low attenuation liver lesions are present most consistent with cysts.  No ductal dilatation is seen.  No calcified gallstones are noted.  The pancreas is normal in size and the pancreatic duct is not dilated. The adrenal glands and spleen are unremarkable.  The stomach is moderately fluid distended with no abnormality evident.  The kidneys enhance with no evidence of calculus and no renal mass is seen.  The pelvocaliceal systems are unremarkable.  The abdominal aorta is normal in caliber with atheromatous change present. The mesenteric vasculature appears normal.  However, within the mid and upper pelvis there are abnormal small bowel loops present which demonstrate diffuse mucosal edema.  The terminal ileum is unremarkable. The primary considerations are that of an infectious or inflammatory enteritis versus an ischemic process. A small amount of fluid is noted within the pelvis.  The uterus is normal in size.    No abnormality of the colon is seen. Hardware for posterior fusion of L3-4 and L4-5 is noted.  IMPRESSION:  1.  Edema of the small bowel loops within the mid and upper pelvis. Consider an infectious or inflammatory enteritis versus an ischemic process. 2.  Small hiatal hernia. 3.  Stable benign appearing small nodules at the right lung base.  Original Report Authenticated By: Juline Patch, M.D.    Assessment & Plan: Principal Problem:  *Abdominal pain - presentation coupled with CT scan findings are worrisome for acute SB ischemia. Will consult general surgery. Start IVF, keep NPO with ice chips, recheck labs tonight.    Keian Odriscoll CHARLES 09/30/2011, 6:05 PM

## 2011-10-01 DIAGNOSIS — E871 Hypo-osmolality and hyponatremia: Secondary | ICD-10-CM | POA: Diagnosis present

## 2011-10-01 LAB — CBC
HCT: 38.9 % (ref 36.0–46.0)
Hemoglobin: 13.2 g/dL (ref 12.0–15.0)
MCH: 31.7 pg (ref 26.0–34.0)
MCHC: 33.9 g/dL (ref 30.0–36.0)
MCV: 93.5 fL (ref 78.0–100.0)
Platelets: 183 10*3/uL (ref 150–400)
RBC: 4.16 MIL/uL (ref 3.87–5.11)
RDW: 13.2 % (ref 11.5–15.5)
WBC: 4.2 10*3/uL (ref 4.0–10.5)

## 2011-10-01 LAB — BASIC METABOLIC PANEL
BUN: 8 mg/dL (ref 6–23)
CO2: 29 mEq/L (ref 19–32)
Calcium: 8.5 mg/dL (ref 8.4–10.5)
Chloride: 103 mEq/L (ref 96–112)
Creatinine, Ser: 0.7 mg/dL (ref 0.50–1.10)
GFR calc Af Amer: >90 mL/min (ref >90)
GFR calc non Af Amer: 90 mL/min — ABNORMAL LOW (ref >90)
Glucose, Bld: 98 mg/dL (ref 70–99)
Potassium: 3.6 mEq/L (ref 3.5–5.1)
Sodium: 139 mEq/L (ref 135–145)

## 2011-10-01 MED ORDER — DOCUSATE SODIUM 100 MG PO CAPS
200.0000 mg | ORAL_CAPSULE | Freq: Two times a day (BID) | ORAL | Status: DC | PRN
  Administered 2011-10-01 – 2011-10-02 (×3): 200 mg via ORAL
  Filled 2011-10-01: qty 1
  Filled 2011-10-01 (×2): qty 2
  Filled 2011-10-01: qty 1

## 2011-10-01 NOTE — Progress Notes (Signed)
Utilization review completed.  

## 2011-10-01 NOTE — Progress Notes (Signed)
Subjective: Feels a bit better. Did take some morphine last night x2, mainly for headache, but also for abd pain. Nausea is better. She has less pain she thinks  Objective:  Vital Signs: Filed Vitals:   09/30/11 1831 09/30/11 2100 09/30/11 2152 10/01/11 0520  BP: 132/77  143/81 103/60  Pulse: 92  73 67  Temp: 98 F (36.7 C)  97.5 F (36.4 C) 97.7 F (36.5 C)  TempSrc: Oral  Oral Oral  Resp: 20  18 17   Height:  5' 5.5" (1.664 m)    Weight:   70.2 kg (154 lb 12.2 oz)   SpO2: 98%  99% 98%     EXAM: ABD: mild periumbilical tenderness. No rebound. A few bowel sounds heard but mainly quiet.    Intake/Output Summary (Last 24 hours) at 10/01/11 0658 Last data filed at 10/01/11 8413  Gross per 24 hour  Intake 1353.33 ml  Output   1851 ml  Net -497.67 ml    Lab Results:  [facility] [place] 09/30/11 1832  NA 129*  K 4.3  CL 95*  CO2 24  GLUCOSE 129*  BUN 12  CREATININE 0.61  CALCIUM 9.1  MG --  PHOS --    Basename 09/30/11 1832  AST 26  ALT 19  ALKPHOS 63  BILITOT 0.6  PROT 6.1  ALBUMIN 3.9   No results found for this basename: LIPASE:2,AMYLASE:2 in the last 72 hours  Basename 10/01/11 0555 09/30/11 1832  WBC 4.2 7.7  NEUTROABS -- 6.5  HGB 13.2 14.9  HCT 38.9 43.4  MCV 93.5 93.7  PLT 183 230   No results found for this basename: CKTOTAL:3,CKMB:3,CKMBINDEX:3,TROPONINI:3 in the last 72 hours No components found with this basename: POCBNP:3 No results found for this basename: DDIMER:2 in the last 72 hours No results found for this basename: HGBA1C:2 in the last 72 hours No results found for this basename: CHOL:2,HDL:2,LDLCALC:2,TRIG:2,CHOLHDL:2,LDLDIRECT:2 in the last 72 hours No results found for this basename: TSH,T4TOTAL,FREET3,T3FREE,THYROIDAB in the last 72 hours No results found for this basename: VITAMINB12:2,FOLATE:2,FERRITIN:2,TIBC:2,IRON:2,RETICCTPCT:2 in the last 72 hours  Studies/Results: Ct Abdomen Pelvis W Contrast  09/30/2011  *RADIOLOGY REPORT*   Clinical Data: Mid abdominal pain today, nausea, prior appendectomy  CT ABDOMEN AND PELVIS WITH CONTRAST  Technique:  Multidetector CT imaging of the abdomen and pelvis was performed following the standard protocol during bolus administration of intravenous contrast.  Contrast: OMNIPAQUE IOHEXOL 300 MG/ML  SOLN  Comparison: CT abdomen and pelvis of 04/04/1999 and  Findings: Two 3 mm noncalcified nodules are present in the right lower lobe, not definitely included on the prior study through the lung bases. However, in review of the CT chest from November 2010 these nodules are completely stable and consistent with a benign process.  There is a small hiatal hernia present.  A few low attenuation liver lesions are present most consistent with cysts.  No ductal dilatation is seen.  No calcified gallstones are noted.  The pancreas is normal in size and the pancreatic duct is not dilated. The adrenal glands and spleen are unremarkable.  The stomach is moderately fluid distended with no abnormality evident.  The kidneys enhance with no evidence of calculus and no renal mass is seen.  The pelvocaliceal systems are unremarkable.  The abdominal aorta is normal in caliber with atheromatous change present. The mesenteric vasculature appears normal.  However, within the mid and upper pelvis there are abnormal small bowel loops present which demonstrate diffuse mucosal edema.  The terminal ileum is unremarkable.  The primary considerations are that of an infectious or inflammatory enteritis versus an ischemic process. A small amount of fluid is noted within the pelvis.  The uterus is normal in size.    No abnormality of the colon is seen. Hardware for posterior fusion of L3-4 and L4-5 is noted.  IMPRESSION:  1.  Edema of the small bowel loops within the mid and upper pelvis. Consider an infectious or inflammatory enteritis versus an ischemic process. 2.  Small hiatal hernia. 3.  Stable benign appearing small nodules at the  right lung base.  Original Report Authenticated By: Juline Patch, M.D.   Medications: Scheduled Meds:   . ciprofloxacin  400 mg Intravenous Q12H  . Estradiol-Norethindrone Acet  1 tablet Oral Daily  . heparin  5,000 Units Subcutaneous Q8H  . metronidazole  500 mg Intravenous Q6H  . pantoprazole  40 mg Oral Q1200  . Vitamin D (Ergocalciferol)  50,000 Units Oral Weekly   Continuous Infusions:   . dextrose 5 % and 0.45% NaCl 100 mL/hr at 09/30/11 1851   PRN Meds:.acetaminophen, acetaminophen, alum & mag hydroxide-simeth, morphine injection, ondansetron (ZOFRAN) IV, ondansetron, polyethylene glycol, polyvinyl alcohol, zolpidem, DISCONTD: Polyethyl Glycol-Propyl Glycol  Assessment/Plan: Principal Problem:  *Abdominal pain - appreciate Dr. Carollee Massed help. She seems to be better. Diff dx remains ischemia vs infection. Continue IV abx.  Active Problems:  Hyponatremia - this was surprising. Na in office was okay. Recheck today is pending.    LOS: 1 day   Sherry [name] 10/01/2011, 6:58 AM

## 2011-10-01 NOTE — Progress Notes (Signed)
Patient ID: Sherry [name], female   DOB: Nov 29, 1945, 66 y.o.   MRN: [id number]    Subjective: Pt reports feeling better today, had some n/v yesterday but mostly resolved today.  Had very small BM last night, reports chronic constipation at home. Patient reports feeling very thristy and hungry  Objective: Vital signs in last 24 hours: Temp:  [97.5 F (36.4 C)-98 F (36.7 C)] 97.7 F (36.5 C) (06/19 0520) Pulse Rate:  [67-92] 67  (06/19 0520) Resp:  [17-20] 17  (06/19 0520) BP: (103-143)/(60-81) 103/60 mmHg (06/19 0520) SpO2:  [98 %-99 %] 98 % (06/19 0520) Weight:  [154 lb 12.2 oz (70.2 kg)] 154 lb 12.2 oz (70.2 kg) (06/18 2152) Last BM Date: 09/30/11  Intake/Output from previous day: 06/18 0701 - 06/19 0700 In: 1353.3 [I.V.:1153.3; IV Piggyback:200] Out: 1851 [Urine:1850; Stool:1] Intake/Output this shift:    PE: Abd: soft, mildly tender in the umbilicus region, hypoactive bs,   Lab Results:   [facility] 10/01/11 0555 09/30/11 1832  WBC 4.2 7.7  HGB 13.2 14.9  HCT 38.9 43.4  PLT 183 230   BMET  Basename 10/01/11 0555 09/30/11 1832  NA 139 129*  K 3.6 4.3  CL 103 95*  CO2 29 24  GLUCOSE 98 129*  BUN 8 12  CREATININE 0.70 0.61  CALCIUM 8.5 9.1   PT/INR  Basename 09/30/11 1832  LABPROT 13.3  INR 0.99   CMP     Component Value Date/Time   NA 139 10/01/2011 0555   K 3.6 10/01/2011 0555   CL 103 10/01/2011 0555   CO2 29 10/01/2011 0555   GLUCOSE 98 10/01/2011 0555   BUN 8 10/01/2011 0555   CREATININE 0.70 10/01/2011 0555   CALCIUM 8.5 10/01/2011 0555   PROT 6.1 09/30/2011 1832   ALBUMIN 3.9 09/30/2011 1832   AST 26 09/30/2011 1832   ALT 19 09/30/2011 1832   ALKPHOS 63 09/30/2011 1832   BILITOT 0.6 09/30/2011 1832   GFRNONAA 90* 10/01/2011 0555   GFRAA >90 10/01/2011 0555   Lipase  No results found for this basename: lipase       Studies/Results: Ct Abdomen Pelvis W Contrast  09/30/2011  *RADIOLOGY REPORT*  Clinical Data: Mid abdominal pain today, nausea,  prior appendectomy  CT ABDOMEN AND PELVIS WITH CONTRAST  Technique:  Multidetector CT imaging of the abdomen and pelvis was performed following the standard protocol during bolus administration of intravenous contrast.  Contrast: OMNIPAQUE IOHEXOL 300 MG/ML  SOLN  Comparison: CT abdomen and pelvis of 04/04/1999 and  Findings: Two 3 mm noncalcified nodules are present in the right lower lobe, not definitely included on the prior study through the lung bases. However, in review of the CT chest from November 2010 these nodules are completely stable and consistent with a benign process.  There is a small hiatal hernia present.  A few low attenuation liver lesions are present most consistent with cysts.  No ductal dilatation is seen.  No calcified gallstones are noted.  The pancreas is normal in size and the pancreatic duct is not dilated. The adrenal glands and spleen are unremarkable.  The stomach is moderately fluid distended with no abnormality evident.  The kidneys enhance with no evidence of calculus and no renal mass is seen.  The pelvocaliceal systems are unremarkable.  The abdominal aorta is normal in caliber with atheromatous change present. The mesenteric vasculature appears normal.  However, within the mid and upper pelvis there are abnormal small bowel loops present which  demonstrate diffuse mucosal edema.  The terminal ileum is unremarkable. The primary considerations are that of an infectious or inflammatory enteritis versus an ischemic process. A small amount of fluid is noted within the pelvis.  The uterus is normal in size.    No abnormality of the colon is seen. Hardware for posterior fusion of L3-4 and L4-5 is noted.  IMPRESSION:  1.  Edema of the small bowel loops within the mid and upper pelvis. Consider an infectious or inflammatory enteritis versus an ischemic process. 2.  Small hiatal hernia. 3.  Stable benign appearing small nodules at the right lung base.  Original Report Authenticated  By: Juline Patch, M.D.    Anti-infectives: Anti-infectives     Start     Dose/Rate Route Frequency Ordered Stop   09/30/11 2100   ciprofloxacin (CIPRO) IVPB 400 mg        400 mg 200 mL/hr over 60 Minutes Intravenous Every 12 hours 09/30/11 1957     09/30/11 2100   metroNIDAZOLE (FLAGYL) IVPB 500 mg        500 mg 100 mL/hr over 60 Minutes Intravenous Every 6 hours 09/30/11 1957             Assessment/Plan  1.  Enteritis: Ischemic versus infectious:  CT and evaluation seems to suggest infectious source, on cipro and flagyl, patient with chronic constipation at home and will add colace, if improving later today may be able to have sips of clears.  Will follow.   LOS: 1 day    Doshie Maggi 10/01/2011

## 2011-10-01 NOTE — Progress Notes (Signed)
Agree with A&P for EW,PA. Abdomen is soft and I believe this will resolve

## 2011-10-01 NOTE — Progress Notes (Signed)
Called pharmacy regarding patient's medicine:Estradiol-Novethindrone,per Pharmacist,we don't carry this medicine.Patient's husband to bring medicine from home. Laury Huizar Joselita,RN

## 2011-10-02 LAB — DIFFERENTIAL
Basophils Absolute: 0 10*3/uL (ref 0.0–0.1)
Basophils Relative: 0 % (ref 0–1)
Eosinophils Absolute: 0.1 10*3/uL (ref 0.0–0.7)
Eosinophils Relative: 3 % (ref 0–5)
Lymphocytes Relative: 40 % (ref 12–46)
Lymphs Abs: 1.6 10*3/uL (ref 0.7–4.0)
Monocytes Absolute: 0.8 10*3/uL (ref 0.1–1.0)
Monocytes Relative: 19 % — ABNORMAL HIGH (ref 3–12)
Neutro Abs: 1.6 10*3/uL — ABNORMAL LOW (ref 1.7–7.7)
Neutrophils Relative %: 38 % — ABNORMAL LOW (ref 43–77)

## 2011-10-02 LAB — CBC
HCT: 38.6 % (ref 36.0–46.0)
Hemoglobin: 13 g/dL (ref 12.0–15.0)
MCH: 32.1 pg (ref 26.0–34.0)
MCHC: 33.7 g/dL (ref 30.0–36.0)
MCV: 95.3 fL (ref 78.0–100.0)
Platelets: 175 10*3/uL (ref 150–400)
RBC: 4.05 MIL/uL (ref 3.87–5.11)
RDW: 13.4 % (ref 11.5–15.5)
WBC: 4.1 10*3/uL (ref 4.0–10.5)

## 2011-10-02 LAB — BASIC METABOLIC PANEL
BUN: 6 mg/dL (ref 6–23)
CO2: 27 mEq/L (ref 19–32)
Calcium: 8.3 mg/dL — ABNORMAL LOW (ref 8.4–10.5)
Chloride: 107 mEq/L (ref 96–112)
Creatinine, Ser: 0.73 mg/dL (ref 0.50–1.10)
GFR calc Af Amer: >90 mL/min (ref >90)
GFR calc non Af Amer: 88 mL/min — ABNORMAL LOW (ref >90)
Glucose, Bld: 106 mg/dL — ABNORMAL HIGH (ref 70–99)
Potassium: 3.7 mEq/L (ref 3.5–5.1)
Sodium: 139 mEq/L (ref 135–145)

## 2011-10-02 MED ORDER — HYDROCODONE-ACETAMINOPHEN 5-325 MG PO TABS
1.0000 | ORAL_TABLET | Freq: Four times a day (QID) | ORAL | Status: DC | PRN
Start: 2011-10-02 — End: 2011-10-03
  Administered 2011-10-02 – 2011-10-03 (×3): 1 via ORAL
  Filled 2011-10-02: qty 1
  Filled 2011-10-02: qty 2
  Filled 2011-10-02: qty 1

## 2011-10-02 MED ORDER — ESTRADIOL-NORETHINDRONE ACET 0.5-0.1 MG PO TABS
1.0000 | ORAL_TABLET | Freq: Every day | ORAL | Status: DC
  Administered 2011-10-02 – 2011-10-03 (×2): 1 via ORAL
  Filled 2011-10-02: qty 1

## 2011-10-02 MED ORDER — METRONIDAZOLE 500 MG PO TABS
500.0000 mg | ORAL_TABLET | Freq: Three times a day (TID) | ORAL | Status: DC
  Administered 2011-10-02 – 2011-10-03 (×5): 500 mg via ORAL
  Filled 2011-10-02 (×7): qty 1

## 2011-10-02 MED ORDER — MORPHINE SULFATE 2 MG/ML IJ SOLN
2.0000 mg | Freq: Once | INTRAMUSCULAR | Status: AC
  Administered 2011-10-02: 2 mg via INTRAVENOUS
  Filled 2011-10-02: qty 1

## 2011-10-02 MED ORDER — CIPROFLOXACIN HCL 500 MG PO TABS
500.0000 mg | ORAL_TABLET | Freq: Two times a day (BID) | ORAL | Status: DC
  Administered 2011-10-02 – 2011-10-03 (×3): 500 mg via ORAL
  Filled 2011-10-02 (×6): qty 1

## 2011-10-02 NOTE — Progress Notes (Signed)
Abdominal pain  Subjective: Feels better this am. Taking some miralax. Urine was a bit darker than usual. Pain better  Objective: Vital signs in last 24 hours: Temp:  [97.2 F (36.2 C)-98.3 F (36.8 C)] 98.3 F (36.8 C) (06/20 0540) Pulse Rate:  [64-80] 70  (06/20 0540) Resp:  [16-18] 16  (06/20 0540) BP: (91-115)/(51-71) 91/57 mmHg (06/20 0540) SpO2:  [97 %-98 %] 97 % (06/20 0540) Weight:  [152 lb 14.4 oz (69.355 kg)] 152 lb 14.4 oz (69.355 kg) (06/19 2042) Last BM Date: 10/01/11 (small)  Intake/Output from previous day: 06/19 0701 - 06/20 0700 In: 2971.7 [P.O.:610; I.V.:1661.7; IV Piggyback:700] Out: 3776 [Urine:3775; Stool:1] Intake/Output this shift:    General appearance: alert, cooperative and no distress GI: soft, non-tender; bowel sounds normal; no masses,  no organomegaly  Lab Results:  Results for orders placed during the hospital encounter of 09/30/11 (from the past 24 hour(s))  CBC     Status: Normal   Collection Time   10/02/11  5:55 AM      Component Value Range   WBC 4.1  4.0 - 10.5 K/uL   RBC 4.05  3.87 - 5.11 MIL/uL   Hemoglobin 13.0  12.0 - 15.0 g/dL   HCT 08.6  57.8 - 46.9 %   MCV 95.3  78.0 - 100.0 fL   MCH 32.1  26.0 - 34.0 pg   MCHC 33.7  30.0 - 36.0 g/dL   RDW 62.9  52.8 - 41.3 %   Platelets 175  150 - 400 K/uL  DIFFERENTIAL     Status: Abnormal   Collection Time   10/02/11  5:55 AM      Component Value Range   Neutrophils Relative 38 (*) 43 - 77 %   Neutro Abs 1.6 (*) 1.7 - 7.7 K/uL   Lymphocytes Relative 40  12 - 46 %   Lymphs Abs 1.6  0.7 - 4.0 K/uL   Monocytes Relative 19 (*) 3 - 12 %   Monocytes Absolute 0.8  0.1 - 1.0 K/uL   Eosinophils Relative 3  0 - 5 %   Eosinophils Absolute 0.1  0.0 - 0.7 K/uL   Basophils Relative 0  0 - 1 %   Basophils Absolute 0.0  0.0 - 0.1 K/uL  BASIC METABOLIC PANEL     Status: Abnormal   Collection Time   10/02/11  5:55 AM      Component Value Range   Sodium 139  135 - 145 mEq/L   Potassium 3.7  3.5 -  5.1 mEq/L   Chloride 107  96 - 112 mEq/L   CO2 27  19 - 32 mEq/L   Glucose, Bld 106 (*) 70 - 99 mg/dL   BUN 6  6 - 23 mg/dL   Creatinine, Ser 2.44  0.50 - 1.10 mg/dL   Calcium 8.3 (*) 8.4 - 10.5 mg/dL   GFR calc non Af Amer 88 (*) >90 mL/min   GFR calc Af Amer >90  >90 mL/min     Studies/Results Radiology     MEDS, Scheduled    . ciprofloxacin  500 mg Oral BID  . Estradiol-Norethindrone Acet  1 tablet Oral Daily  . heparin  5,000 Units Subcutaneous Q8H  . metroNIDAZOLE  500 mg Oral Q8H  . pantoprazole  40 mg Oral Q1200  . Vitamin D (Ergocalciferol)  50,000 Units Oral Weekly  . DISCONTD: ciprofloxacin  400 mg Intravenous Q12H  . DISCONTD: metronidazole  500 mg Intravenous Q6H  Assessment: Abdominal pain Improving  Plan: Agree with plans to advance diet.  We will see again PRN. Likely home tomorrow  LOS: 2 days    Currie Paris, MD, [facility], [place] [phone]   10/02/2011 7:34 AM

## 2011-10-02 NOTE — Progress Notes (Signed)
Subjective: Feeling better. Less pain. Did take Morphine x4 yesterday, but stated that her pain was more of a discomfort than an overt pain (was 4/10). Much less than the pain that led to admission.   She states no BM although I/O suggests one stool. I would believe her as she has had constipation as a problem x years. She took Miralax last night and I encouraged her to take more today.   Objective:  Vital Signs: Filed Vitals:   10/01/11 1320 10/01/11 1826 10/01/11 2042 10/02/11 0540  BP: 100/65 115/51 112/71 91/57  Pulse: 80 76 64 70  Temp: 98 F (36.7 C) 97.2 F (36.2 C) 97.6 F (36.4 C) 98.3 F (36.8 C)  TempSrc: Oral Oral Oral Oral  Resp: 18 18 18 16   Height:   5' 5.5" (1.664 m)   Weight:   69.355 kg (152 lb 14.4 oz)   SpO2: 98% 98% 98% 97%     EXAM: Soft, trivial if any tenderness.    Intake/Output Summary (Last 24 hours) at 10/02/11 0634 Last data filed at 10/01/11 2300  Gross per 24 hour  Intake 2971.67 ml  Output   3776 ml  Net -804.33 ml    Lab Results:  Basename 10/01/11 0555 09/30/11 1832  NA 139 129*  K 3.6 4.3  CL 103 95*  CO2 29 24  GLUCOSE 98 129*  BUN 8 12  CREATININE 0.70 0.61  CALCIUM 8.5 9.1  MG -- --  PHOS -- --    Basename 09/30/11 1832  AST 26  ALT 19  ALKPHOS 63  BILITOT 0.6  PROT 6.1  ALBUMIN 3.9   No results found for this basename: LIPASE:2,AMYLASE:2 in the last 72 hours  Basename 10/02/11 0555 10/01/11 0555 09/30/11 1832  WBC 4.1 4.2 --  NEUTROABS 1.6* -- 6.5  HGB 13.0 13.2 --  HCT 38.6 38.9 --  MCV 95.3 93.5 --  PLT 175 183 --   No results found for this basename: CKTOTAL:3,CKMB:3,CKMBINDEX:3,TROPONINI:3 in the last 72 hours No components found with this basename: POCBNP:3 No results found for this basename: DDIMER:2 in the last 72 hours No results found for this basename: HGBA1C:2 in the last 72 hours No results found for this basename: CHOL:2,HDL:2,LDLCALC:2,TRIG:2,CHOLHDL:2,LDLDIRECT:2 in the last 72 hours No  results found for this basename: TSH,T4TOTAL,FREET3,T3FREE,THYROIDAB in the last 72 hours No results found for this basename: VITAMINB12:2,FOLATE:2,FERRITIN:2,TIBC:2,IRON:2,RETICCTPCT:2 in the last 72 hours  Studies/Results: Ct Abdomen Pelvis W Contrast  09/30/2011  *RADIOLOGY REPORT*  Clinical Data: Mid abdominal pain today, nausea, prior appendectomy  CT ABDOMEN AND PELVIS WITH CONTRAST  Technique:  Multidetector CT imaging of the abdomen and pelvis was performed following the standard protocol during bolus administration of intravenous contrast.  Contrast: OMNIPAQUE IOHEXOL 300 MG/ML  SOLN  Comparison: CT abdomen and pelvis of 04/04/1999 and  Findings: Two 3 mm noncalcified nodules are present in the right lower lobe, not definitely included on the prior study through the lung bases. However, in review of the CT chest from November 2010 these nodules are completely stable and consistent with a benign process.  There is a small hiatal hernia present.  A few low attenuation liver lesions are present most consistent with cysts.  No ductal dilatation is seen.  No calcified gallstones are noted.  The pancreas is normal in size and the pancreatic duct is not dilated. The adrenal glands and spleen are unremarkable.  The stomach is moderately fluid distended with no abnormality evident.  The kidneys enhance with  no evidence of calculus and no renal mass is seen.  The pelvocaliceal systems are unremarkable.  The abdominal aorta is normal in caliber with atheromatous change present. The mesenteric vasculature appears normal.  However, within the mid and upper pelvis there are abnormal small bowel loops present which demonstrate diffuse mucosal edema.  The terminal ileum is unremarkable. The primary considerations are that of an infectious or inflammatory enteritis versus an ischemic process. A small amount of fluid is noted within the pelvis.  The uterus is normal in size.    No abnormality of the colon is seen.  Hardware for posterior fusion of L3-4 and L4-5 is noted.  IMPRESSION:  1.  Edema of the small bowel loops within the mid and upper pelvis. Consider an infectious or inflammatory enteritis versus an ischemic process. 2.  Small hiatal hernia. 3.  Stable benign appearing small nodules at the right lung base.  Original Report Authenticated By: Juline Patch, M.D.   Medications: Scheduled Meds:   . ciprofloxacin  400 mg Intravenous Q12H  . Estradiol-Norethindrone Acet  1 tablet Oral Daily  . heparin  5,000 Units Subcutaneous Q8H  . metronidazole  500 mg Intravenous Q6H  . pantoprazole  40 mg Oral Q1200  . Vitamin D (Ergocalciferol)  50,000 Units Oral Weekly   Continuous Infusions:   . dextrose 5 % and 0.45% NaCl 100 mL/hr at 10/01/11 2350   PRN Meds:.acetaminophen, acetaminophen, alum & mag hydroxide-simeth, docusate sodium, morphine injection, ondansetron (ZOFRAN) IV, ondansetron, polyethylene glycol, polyvinyl alcohol, zolpidem  Assessment/Plan: Principal Problem:  *Abdominal pain - initial differential was vascular insult vs infection. Her improvement argues that, if it was ischemia, it is improving. Will advance diet and change to po. Possible d/c in AM if doing well and if okay with surgery.    LOS: 2 days   [facility] 10/02/2011, 6:34 AM

## 2011-10-03 ENCOUNTER — Inpatient Hospital Stay (HOSPITAL_COMMUNITY): Payer: BC Managed Care – PPO

## 2011-10-03 DIAGNOSIS — R109 Unspecified abdominal pain: Secondary | ICD-10-CM | POA: Diagnosis not present

## 2011-10-03 MED ORDER — TRAMADOL HCL 50 MG PO TABS: 50.0000 mg | ORAL_TABLET | Freq: Four times a day (QID) | ORAL | Status: AC | PRN

## 2011-10-03 MED ORDER — CIPROFLOXACIN HCL 500 MG PO TABS: 500.0000 mg | ORAL_TABLET | Freq: Two times a day (BID) | ORAL | Status: AC

## 2011-10-03 MED ORDER — METRONIDAZOLE 500 MG PO TABS: 500.0000 mg | ORAL_TABLET | Freq: Three times a day (TID) | ORAL | Status: AC

## 2011-10-03 MED ORDER — POLYETHYL GLYCOL-PROPYL GLYCOL 0.4-0.3 % OP SOLN: 1.0000 [drp] | Freq: Every day | OPHTHALMIC | Status: DC | PRN

## 2011-10-03 MED ORDER — POLYETHYLENE GLYCOL 3350 17 G PO PACK: 17.0000 g | PACK | Freq: Every day | ORAL | Status: DC | PRN

## 2011-10-03 NOTE — Discharge Summary (Signed)
Physician Discharge Summary  NAME:Sherry [name]  GEX:[id number]  DOB: 1945-09-03   Admit date: 09/30/2011 Discharge date: 10/03/2011  Admitting Diagnosis: Abdominal pain  Discharge Diagnoses:  Principal Problem:  *Abdominal pain Active Problems:  Flank pain   Discharge Condition: improved  Hospital Course: Patient admitted after CT suggested possible ischemia vs infection. G Surgery thought it was likely infection. Treated with Cipro and metronidazole, the patient improved. On the night prior to d/c the left flank was painful. CT scan reviewed and there was no stone. Presumed to be secondary to MSK pain. Diet advanced. Patient tolerated and is sent home.   Consults: G Surgery  Disposition: 01-Home or Self Care  @DISCHARGEORDER @ Medication List  As of 10/03/2011 11:37 AM   TAKE these medications         ciprofloxacin 500 MG tablet   Commonly known as: CIPRO   Take 1 tablet (500 mg total) by mouth 2 (two) times daily.      ergocalciferol 50000 UNITS capsule   Commonly known as: VITAMIN D2   Take 50,000 mg by mouth once a week.      Estradiol-Norethindrone Acet 0.5-0.1 MG per tablet   Take 1 tablet by mouth daily.      metroNIDAZOLE 500 MG tablet   Commonly known as: FLAGYL   Take 1 tablet (500 mg total) by mouth every 8 (eight) hours.      Polyethyl Glycol-Propyl Glycol 0.4-0.3 % Soln   Place 1 drop into both eyes daily as needed. For dry eyes      polyethylene glycol packet   Commonly known as: MIRALAX / GLYCOLAX   Take 17 g by mouth daily as needed. For constipation      traMADol 50 MG tablet   Commonly known as: ULTRAM   Take 1 tablet (50 mg total) by mouth every 6 (six) hours as needed for pain.      zolpidem 10 MG tablet   Commonly known as: AMBIEN   Take 3.33 mg by mouth at bedtime as needed. For sleep           Follow-up Information    Follow up with Darnelle Bos, MD. (we will call you next week with appt)    Contact information:   [address], [place], [place]. [facility] [place] [address] [phone]          Things to follow up in the outpatient setting: none  Time coordinating discharge: 35 minutes including medication reconciliation, transmission of prescriptions to pharmacy, preparation of discharge papers, and discussion with patient     Signed: Darnelle Bos 10/03/2011, 11:37 AM

## 2011-10-03 NOTE — Progress Notes (Signed)
Subjective: Had severe left flank pain that radiated toward the buttock, not toward the groin, last night. This was associated with some bloating and abd distension. Abd pain that brought her in is generally better but still continues off and on. She is hungry and wants to advance her diet.   Objective:  Vital Signs: Filed Vitals:   10/02/11 0905 10/02/11 1300 10/02/11 1755 10/03/11 0514  BP: 101/75 110/80 120/75 117/62  Pulse: 85 87 76 65  Temp: 98.3 F (36.8 C) 98.5 F (36.9 C) 98.1 F (36.7 C) 98.5 F (36.9 C)  TempSrc: Oral Oral Oral Oral  Resp: 16 16 16 16   Height:      Weight:      SpO2: 97% 98% 98% 99%     EXAM: Abd: very soft. She says it is somewhat tender.    Intake/Output Summary (Last 24 hours) at 10/03/11 0719 Last data filed at 10/02/11 2245  Gross per 24 hour  Intake    720 ml  Output   5454 ml  Net  -4734 ml    Lab Results:  Basename 10/02/11 0555 10/01/11 0555  NA 139 139  K 3.7 3.6  CL 107 103  CO2 27 29  GLUCOSE 106* 98  BUN 6 8  CREATININE 0.73 0.70  CALCIUM 8.3* 8.5  MG -- --  PHOS -- --    Basename 09/30/11 1832  AST 26  ALT 19  ALKPHOS 63  BILITOT 0.6  PROT 6.1  ALBUMIN 3.9   No results found for this basename: LIPASE:2,AMYLASE:2 in the last 72 hours  Basename 10/02/11 0555 10/01/11 0555 09/30/11 1832  WBC 4.1 4.2 --  NEUTROABS 1.6* -- 6.5  HGB 13.0 13.2 --  HCT 38.6 38.9 --  MCV 95.3 93.5 --  PLT 175 183 --   No results found for this basename: CKTOTAL:3,CKMB:3,CKMBINDEX:3,TROPONINI:3 in the last 72 hours No components found with this basename: POCBNP:3 No results found for this basename: DDIMER:2 in the last 72 hours No results found for this basename: HGBA1C:2 in the last 72 hours No results found for this basename: CHOL:2,HDL:2,LDLCALC:2,TRIG:2,CHOLHDL:2,LDLDIRECT:2 in the last 72 hours No results found for this basename: TSH,T4TOTAL,FREET3,T3FREE,THYROIDAB in the last 72 hours No results found for this basename:  VITAMINB12:2,FOLATE:2,FERRITIN:2,TIBC:2,IRON:2,RETICCTPCT:2 in the last 72 hours  Studies/Results: No results found. Medications: Scheduled Meds:   . ciprofloxacin  500 mg Oral BID  . Estradiol-Norethindrone Acet  1 tablet Oral Daily  . heparin  5,000 Units Subcutaneous Q8H  . metroNIDAZOLE  500 mg Oral Q8H  .  morphine injection  2 mg Intravenous Once  . pantoprazole  40 mg Oral Q1200  . Vitamin D (Ergocalciferol)  50,000 Units Oral Weekly  . DISCONTD: Estradiol-Norethindrone Acet  1 tablet Oral Daily   Continuous Infusions:  PRN Meds:.acetaminophen, acetaminophen, alum & mag hydroxide-simeth, docusate sodium, HYDROcodone-acetaminophen, morphine injection, ondansetron (ZOFRAN) IV, ondansetron, polyethylene glycol, polyvinyl alcohol, zolpidem  Assessment/Plan: Principal Problem:  *Abdominal pain - this seems better. No BM yet and so, in conjunction with pain last night, will check xray today. Advance diet.  Active Problems:  Flank pain - she did not have kidney stone on CT three days ago so doubt she does now. I wonder if this is MSK pain. She has a h/o laminectomy last year and the beds are pretty soft and may be causing some problems. If Xray is okay, we may consider d/c with some pain meds so she can get back to her usual bed.    LOS: 3  days   [facility] 10/03/2011, 7:19 AM

## 2011-10-03 NOTE — Progress Notes (Signed)
   CARE MANAGEMENT NOTE 10/03/2011  Patient:  LANE, KJOS   Account Number:  [id number]  Date Initiated:  10/03/2011  Documentation initiated by:  Darlyne Russian  Subjective/Objective Assessment:   Patient admitted with abdominal pain     Action/Plan:   Progression of care and discharge planning   Anticipated DC Date:  10/03/2011   Anticipated DC Plan:  HOME/SELF CARE      DC Planning Services  CM consult      Choice offered to / List presented to:             Status of service:  In process, will continue to follow Medicare Important Message given?   (If response is "NO", the following Medicare IM given date fields will be blank) Date Medicare IM given:   Date Additional Medicare IM given:    Discharge Disposition:    Per UR Regulation:    If discussed at Long Length of Stay Meetings, dates discussed:    Comments:  PCP: Dr Earl Gala  10/03/2011  [address] [place], [place] [phone] Met with patient to discuss CM and discharge planning. She lives at home with spouse, no issues reported related to medical follow up.

## 2011-10-03 NOTE — Progress Notes (Signed)
Pt. Got d/c instructions after tolerating the advance diet.IV was d/c.pt. Ready to go home

## 2011-10-17 ENCOUNTER — Encounter: Payer: Self-pay | Admitting: *Deleted

## 2011-10-21 ENCOUNTER — Telehealth: Payer: Self-pay | Admitting: *Deleted

## 2011-10-21 ENCOUNTER — Ambulatory Visit (INDEPENDENT_AMBULATORY_CARE_PROVIDER_SITE_OTHER): Payer: BC Managed Care – PPO | Admitting: Internal Medicine

## 2011-10-21 ENCOUNTER — Other Ambulatory Visit (INDEPENDENT_AMBULATORY_CARE_PROVIDER_SITE_OTHER): Payer: BC Managed Care – PPO

## 2011-10-21 ENCOUNTER — Encounter: Payer: Self-pay | Admitting: *Deleted

## 2011-10-21 ENCOUNTER — Encounter: Payer: Self-pay | Admitting: Internal Medicine

## 2011-10-21 VITALS — BP 120/74 | HR 68 | Ht 65.0 in | Wt 148.0 lb

## 2011-10-21 DIAGNOSIS — K55059 Acute (reversible) ischemia of intestine, part and extent unspecified: Secondary | ICD-10-CM

## 2011-10-21 DIAGNOSIS — R933 Abnormal findings on diagnostic imaging of other parts of digestive tract: Secondary | ICD-10-CM

## 2011-10-21 LAB — TSH: TSH: 0.82 u[IU]/mL (ref 0.35–5.50)

## 2011-10-21 LAB — SEDIMENTATION RATE: Sed Rate: 3 mm/hr (ref 0–22)

## 2011-10-21 MED ORDER — DICYCLOMINE HCL 10 MG PO CAPS: 10.0000 mg | ORAL_CAPSULE | Freq: Two times a day (BID) | ORAL | Status: DC

## 2011-10-21 MED ORDER — ALIGN 4 MG PO CAPS: 1.0000 | ORAL_CAPSULE | Freq: Every day | ORAL | Status: DC

## 2011-10-21 NOTE — Progress Notes (Signed)
Patient ID: Sherry [name], female   DOB: 1946/03/07, 66 y.o.   MRN: [id number] Pt here for OV with Dr Juanda Chance who ordered a Small Bowel Capsule Endo. Instructed pt on need for Capsule, prep and instructions for eating the day before and day of the Capsule which is scheduled for 10/23/11. Pt was given information about the study and her questions were answered. Pt was shown a capsule and she thinks she can swallow. Pt will call for questions and today, stated understanding.

## 2011-10-21 NOTE — Telephone Encounter (Signed)
Informed pt of Morrie Sheldon Dempsey's note stating [employer] needs more time to approve her capsule endo. Pt stated Dr Juanda Chance stated she wants it done soon in case there is an infection to be seen. She can't come next week. Resent a note to [place] to ck on status.

## 2011-10-21 NOTE — Progress Notes (Signed)
Sherry [name] Feb 06, 1946 MRN [id number]   History of Present Illness:  This is a 66 year old, white female with a recent hospitalization for acute periumbilical abdominal pain and abnormal CT scan showing abnormal loops of the small bowel suggestive of acute ischemia or infection. She was treated empirically with Cipro and Flagyl while in the hospital and for about a week following her discharge. She still continues to have diarrhea although the abdominal pain has subsided. There has been no fever or blood in the stools. She has lost about 5-8 pounds. She denies nausea or vomiting and she has been tolerating a regular diet. There is a history of irritable bowel syndrome with predominant constipation. An upper endoscopy in November 2007 showed a 3 cm hiatal hernia. Her last colonoscopy in June 2007 was essentially normal. A prior colonoscopy in 2001 showed a hyperplastic polyp. Her grandfather had colon cancer. There is a history of lactose intolerance.   Past Medical History  Diagnosis Date  . TMJ (dislocation of temporomandibular joint)   . Bronchitis   . Carpal tunnel syndrome     bilateral  . Chronic back pain     herniated disc,stenosis,radiculopathy,spondylolisthesis  . Chronic neck pain   . Eczema   . Hiatal hernia   . Constipation, chronic   . Hemorrhoids   . IBS (irritable bowel syndrome)   . Anxiety     valium prn  . Depression   . History of bronchitis     "I've had it a number of times"  . GERD (gastroesophageal reflux disease)   . Arthritis     "little in my fingers; shoulders"  . Enteritis 09/30/11    "localized into one section of small bowel"   Past Surgical History  Procedure Date  . Tonsillectomy     1951  . Appendectomy     1967  . Knee arthroscopy 1994    "don't remember which side"  . Incision and drainage abscess / hematoma of bursa / knee / thigh 1994    "day after knee scope"  . Back surgery   . Posterior fusion lumbar spine 02/2011    L3-L5  .  Dilation and curettage of uterus 1988  . Breast reduction surgery 1988  . Breast biopsy 1980's    "twice on the left"  . Bladder suspension 1980's    "wasn't successful"    reports that she quit smoking about 43 years ago. Her smoking use included Cigarettes. She quit after 4 years of use. She has never used smokeless tobacco. She reports that she drinks about 3.5 ounces of alcohol per week. She reports that she does not use illicit drugs. family history includes Colon cancer in her maternal grandfather and Kidney disease in her father.  There is no history of Anesthesia problems, and Hypotension, and Malignant hyperthermia, and Pseudochol deficiency, . Allergies  Allergen Reactions  . Adhesive (Tape) Other (See Comments)    Blisters; "gets puffy and red"        Review of Systems: Denies dysphagia odynophagia fever or rectal bleeding  The remainder of the 10 point ROS is negative except as outlined in H&P   Physical Exam: General appearance  Well developed, in no distress. Eyes- non icteric. HEENT nontraumatic, normocephalic. Mouth no lesions, tongue papillated, no cheilosis. Neck supple without adenopathy, thyroid not enlarged, no carotid bruits, no JVD. Lungs Clear to auscultation bilaterally. Cor normal S1, normal S2, regular rhythm, no murmur,  quiet precordium. Abdomen: Of minimally tender around the umbilicus. Hyperactive  bowel sounds. No distention or tympany. No rebound. Rectal: Soft Hemoccult negative stool. Extremities no pedal edema. Skin no lesions. Neurological alert and oriented x 3. Psychological normal mood and affect.  Assessment and Plan:  Problem #1 Recent hospitalization for acute abdominal ischemia of unclear etiology. It is possibly infectious versus ischemic.The etiology of the ischemia is not clear. She is not on any vasoconstrictive medications or anti-hypertensives. There was no evidence of intussusception on a CT scan done 09/30/2011. She was  apparently tested in the past for sprue but I don't have the results. Crohn's disease is very unlikely. We will proceed with a small bowel capsule endoscopy and start her on a Bentyl 10 mg twice a day as well as Align. We will recheck her sprue profile today as well as sedimentation rate and TSH.  Problem #2 Family history of colon cancer in a grandparent. Patient has a personal history of a hyperplastic polyp in 2001 and a normal colonoscopy in 2007. She will be due for a colonoscopy this year. This may be scheduled in the near future.   10/21/2011 Lina Sar

## 2011-10-21 NOTE — Patient Instructions (Addendum)
You have been scheduled for a small bowel capsule endoscopy. Please follow written instructions given to you today. We have given you samples of Align. This puts good bacteria back into your colon. You should take 1 capsule by mouth once daily. If this works well for you, it can be purchased over the counter. Your physician has requested that you go to the basement for the following lab work before leaving today: TSH, Sed Rate, Celiac 10  We have sent the following medications to your pharmacy for you to pick up at your convenience: Bentyl CC: Dr Theressa Millard

## 2011-10-22 LAB — CELIAC PANEL 10
Endomysial Screen: NEGATIVE
Gliadin IgA: 2.8 U/mL (ref <20)
Gliadin IgG: 8 U/mL (ref <20)
IgA: 113 mg/dL (ref 69–380)
Tissue Transglut Ab: 2.9 U/mL (ref <20)
Tissue Transglutaminase Ab, IgA: 2.4 U/mL (ref <20)

## 2011-10-23 ENCOUNTER — Telehealth: Payer: Self-pay | Admitting: *Deleted

## 2011-10-23 NOTE — Telephone Encounter (Signed)
[employer] and spoke with Silvio Clayman. About the status of the prior auth for Capsule Endo. [phone] opt 3, then 2 then 1 for the utilization review dept.. Member ID [id number]   pending [id number]  Proc code [id number]   Our [employer] provider ID [phone]  Chipper Oman reports the prior Berkley Harvey has been approved, but the number will not be faxed until the end of the day. I asked if I could go ahead and scheduled and she stated yes. Waiting on Morrie Sheldon to return.

## 2011-10-23 NOTE — Telephone Encounter (Signed)
Morrie Sheldon stated it should be OK to scheduled pt prior to the fax. Phoned pt and she would like to go ahead and do the Capsule tomorrow. She understands the prep and will come in at 0800am tomorrow. Also informed her her labs were negative; pt stated understanding.

## 2011-10-24 ENCOUNTER — Ambulatory Visit (INDEPENDENT_AMBULATORY_CARE_PROVIDER_SITE_OTHER): Payer: BC Managed Care – PPO | Admitting: Internal Medicine

## 2011-10-24 DIAGNOSIS — K55059 Acute (reversible) ischemia of intestine, part and extent unspecified: Secondary | ICD-10-CM

## 2011-10-27 NOTE — Progress Notes (Signed)
10/24/11 pt in for Capsule stating she completed the prep, has had BMs and has been NPO since midnight. Pt swallowed capsule w/o difficulty and sensor belt was applied and computer module connected with blue light flashing. Pt laid on her side for 30 minutes and tolerated the swallowing w/o any problems. Pt was given the diet and discharge instructions and left. Pt arrived back and stated she had no problems. Blue light still blinking and module removed and connected to computer. Pt given instructions for stool monitoring and s&s to report; pt stated understanding.    LOT 2013-04/20791S       25.

## 2011-11-03 ENCOUNTER — Telehealth: Payer: Self-pay | Admitting: Internal Medicine

## 2011-11-03 DIAGNOSIS — R109 Unspecified abdominal pain: Secondary | ICD-10-CM

## 2011-11-03 NOTE — Telephone Encounter (Signed)
Patient calling to report she never saw the capsule pass after the SBCE on 10/24/11. KUB? Please, advise.

## 2011-11-03 NOTE — Telephone Encounter (Signed)
Please obtain KUB for presence of retained capsule

## 2011-11-04 NOTE — Telephone Encounter (Signed)
Spoke with patient and she will come tomorrow for KUB

## 2011-11-05 ENCOUNTER — Ambulatory Visit (INDEPENDENT_AMBULATORY_CARE_PROVIDER_SITE_OTHER)
Admission: RE | Admit: 2011-11-05 | Discharge: 2011-11-05 | Disposition: A | Discharge status: Home / Self Care | Payer: BC Managed Care – PPO | Source: Ambulatory Visit | Attending: Internal Medicine | Admitting: Internal Medicine

## 2011-11-05 DIAGNOSIS — R109 Unspecified abdominal pain: Secondary | ICD-10-CM

## 2011-11-25 ENCOUNTER — Telehealth: Payer: Self-pay | Admitting: Internal Medicine

## 2011-11-25 NOTE — Telephone Encounter (Signed)
Patient asking for capsule endo results from 10/23/11.(results in basket for MD to review) Please, advise.

## 2011-11-25 NOTE — Telephone Encounter (Signed)
I have discussed normal SBCE with the pt. She wants to know when is her  Next colonoscopy. Last colon 09/2005, recall in 08/2015, please put on recall list

## 2011-11-26 ENCOUNTER — Encounter: Payer: Self-pay | Admitting: Internal Medicine

## 2011-11-26 NOTE — Telephone Encounter (Signed)
Recall in EPIC for 08/2015

## 2012-06-15 ENCOUNTER — Other Ambulatory Visit (HOSPITAL_COMMUNITY): Payer: Self-pay | Admitting: Obstetrics and Gynecology

## 2012-06-15 DIAGNOSIS — Z1231 Encounter for screening mammogram for malignant neoplasm of breast: Secondary | ICD-10-CM

## 2012-07-14 ENCOUNTER — Ambulatory Visit (HOSPITAL_COMMUNITY): Payer: Medicare Other

## 2012-07-19 ENCOUNTER — Ambulatory Visit (HOSPITAL_COMMUNITY): Payer: Medicare Other

## 2012-07-22 ENCOUNTER — Other Ambulatory Visit: Payer: Self-pay | Admitting: Obstetrics and Gynecology

## 2012-07-22 ENCOUNTER — Ambulatory Visit (HOSPITAL_COMMUNITY)
Admission: RE | Admit: 2012-07-22 | Discharge: 2012-07-22 | Disposition: A | Discharge status: Home / Self Care | Payer: Medicare Other | Source: Ambulatory Visit | Attending: Obstetrics and Gynecology | Admitting: Obstetrics and Gynecology

## 2012-07-22 DIAGNOSIS — Z1231 Encounter for screening mammogram for malignant neoplasm of breast: Secondary | ICD-10-CM

## 2012-07-22 DIAGNOSIS — R928 Other abnormal and inconclusive findings on diagnostic imaging of breast: Secondary | ICD-10-CM

## 2012-08-02 ENCOUNTER — Telehealth: Payer: Self-pay | Admitting: *Deleted

## 2012-08-02 NOTE — Telephone Encounter (Signed)
See chart in your office.. Please advise on prior authorization or change medication. sue

## 2012-08-03 ENCOUNTER — Telehealth: Payer: Self-pay | Admitting: *Deleted

## 2012-08-03 NOTE — Telephone Encounter (Signed)
Please work on prior authorization, no other option with this dosage combination.  It has worked very well for patient

## 2012-08-03 NOTE — Telephone Encounter (Signed)
Great!

## 2012-08-03 NOTE — Telephone Encounter (Signed)
Prior authorization approved for Estra/Noreth 08/03/2012 - 04/12/2013. [facility] notified. sue

## 2012-08-03 NOTE — Telephone Encounter (Signed)
Prior authorization approved this morning. [facility] pharmacy notified. sue

## 2012-08-04 ENCOUNTER — Ambulatory Visit
Admission: RE | Admit: 2012-08-04 | Discharge: 2012-08-04 | Disposition: A | Discharge status: Home / Self Care | Payer: Medicare Other | Source: Ambulatory Visit | Attending: Obstetrics and Gynecology | Admitting: Obstetrics and Gynecology

## 2012-08-04 DIAGNOSIS — R928 Other abnormal and inconclusive findings on diagnostic imaging of breast: Secondary | ICD-10-CM

## 2012-08-07 ENCOUNTER — Encounter (HOSPITAL_BASED_OUTPATIENT_CLINIC_OR_DEPARTMENT_OTHER): Payer: Self-pay

## 2012-08-07 ENCOUNTER — Emergency Department (HOSPITAL_BASED_OUTPATIENT_CLINIC_OR_DEPARTMENT_OTHER): Payer: Medicare Other

## 2012-08-07 ENCOUNTER — Emergency Department (HOSPITAL_BASED_OUTPATIENT_CLINIC_OR_DEPARTMENT_OTHER)
Admission: EM | Admit: 2012-08-07 | Discharge: 2012-08-07 | Disposition: A | Discharge status: Home / Self Care | Payer: Medicare Other | Attending: Emergency Medicine | Admitting: Emergency Medicine

## 2012-08-07 DIAGNOSIS — Z8679 Personal history of other diseases of the circulatory system: Secondary | ICD-10-CM | POA: Insufficient documentation

## 2012-08-07 DIAGNOSIS — K219 Gastro-esophageal reflux disease without esophagitis: Secondary | ICD-10-CM | POA: Insufficient documentation

## 2012-08-07 DIAGNOSIS — R109 Unspecified abdominal pain: Secondary | ICD-10-CM

## 2012-08-07 DIAGNOSIS — Z8669 Personal history of other diseases of the nervous system and sense organs: Secondary | ICD-10-CM | POA: Insufficient documentation

## 2012-08-07 DIAGNOSIS — Z872 Personal history of diseases of the skin and subcutaneous tissue: Secondary | ICD-10-CM | POA: Insufficient documentation

## 2012-08-07 DIAGNOSIS — Z8739 Personal history of other diseases of the musculoskeletal system and connective tissue: Secondary | ICD-10-CM | POA: Insufficient documentation

## 2012-08-07 DIAGNOSIS — Z8659 Personal history of other mental and behavioral disorders: Secondary | ICD-10-CM | POA: Insufficient documentation

## 2012-08-07 DIAGNOSIS — K5900 Constipation, unspecified: Secondary | ICD-10-CM | POA: Insufficient documentation

## 2012-08-07 DIAGNOSIS — R10815 Periumbilic abdominal tenderness: Secondary | ICD-10-CM | POA: Insufficient documentation

## 2012-08-07 DIAGNOSIS — Z8709 Personal history of other diseases of the respiratory system: Secondary | ICD-10-CM | POA: Insufficient documentation

## 2012-08-07 DIAGNOSIS — Z87891 Personal history of nicotine dependence: Secondary | ICD-10-CM | POA: Insufficient documentation

## 2012-08-07 DIAGNOSIS — Z8719 Personal history of other diseases of the digestive system: Secondary | ICD-10-CM | POA: Insufficient documentation

## 2012-08-07 DIAGNOSIS — Z79899 Other long term (current) drug therapy: Secondary | ICD-10-CM | POA: Insufficient documentation

## 2012-08-07 LAB — HEPATIC FUNCTION PANEL
ALT: 21 U/L (ref 0–35)
AST: 27 U/L (ref 0–37)
Albumin: 3.7 g/dL (ref 3.5–5.2)
Alkaline Phosphatase: 74 U/L (ref 39–117)
Bilirubin, Direct: <0.1 mg/dL (ref 0.0–0.3)
Total Bilirubin: 0.6 mg/dL (ref 0.3–1.2)
Total Protein: 6.3 g/dL (ref 6.0–8.3)

## 2012-08-07 LAB — CBC
HCT: 43.3 % (ref 36.0–46.0)
Hemoglobin: 14.9 g/dL (ref 12.0–15.0)
MCH: 32.2 pg (ref 26.0–34.0)
MCHC: 34.4 g/dL (ref 30.0–36.0)
MCV: 93.5 fL (ref 78.0–100.0)
Platelets: 296 10*3/uL (ref 150–400)
RBC: 4.63 MIL/uL (ref 3.87–5.11)
RDW: 12.8 % (ref 11.5–15.5)
WBC: 6.9 10*3/uL (ref 4.0–10.5)

## 2012-08-07 LAB — URINE MICROSCOPIC-ADD ON

## 2012-08-07 LAB — URINALYSIS, ROUTINE W REFLEX MICROSCOPIC
Bilirubin Urine: NEGATIVE
Glucose, UA: NEGATIVE mg/dL
Hgb urine dipstick: NEGATIVE
Ketones, ur: NEGATIVE mg/dL
Nitrite: NEGATIVE
Protein, ur: NEGATIVE mg/dL
Specific Gravity, Urine: 1.007 (ref 1.005–1.030)
Urobilinogen, UA: 0.2 mg/dL (ref 0.0–1.0)
pH: 6 (ref 5.0–8.0)

## 2012-08-07 LAB — BASIC METABOLIC PANEL
BUN: 14 mg/dL (ref 6–23)
CO2: 24 mEq/L (ref 19–32)
Calcium: 8.7 mg/dL (ref 8.4–10.5)
Chloride: 100 mEq/L (ref 96–112)
Creatinine, Ser: 0.6 mg/dL (ref 0.50–1.10)
GFR calc Af Amer: >90 mL/min (ref >90)
GFR calc non Af Amer: >90 mL/min (ref >90)
Glucose, Bld: 98 mg/dL (ref 70–99)
Potassium: 3.7 mEq/L (ref 3.5–5.1)
Sodium: 135 mEq/L (ref 135–145)

## 2012-08-07 LAB — TROPONIN I
Troponin I: <0.3 ng/mL (ref <0.30)
Troponin I: <0.3 ng/mL (ref <0.30)

## 2012-08-07 LAB — LIPASE, BLOOD: Lipase: 24 U/L (ref 11–59)

## 2012-08-07 MED ORDER — ASPIRIN 81 MG PO CHEW
324.0000 mg | CHEWABLE_TABLET | Freq: Once | ORAL | Status: AC
  Administered 2012-08-07: 324 mg via ORAL
  Filled 2012-08-07: qty 4

## 2012-08-07 MED ORDER — SODIUM CHLORIDE 0.9 % IV SOLN: INTRAVENOUS | Status: DC

## 2012-08-07 MED ORDER — PANTOPRAZOLE SODIUM 20 MG PO TBEC: 20.0000 mg | DELAYED_RELEASE_TABLET | Freq: Every day | ORAL | Status: DC

## 2012-08-07 MED ORDER — IOHEXOL 300 MG/ML  SOLN
50.0000 mL | Freq: Once | INTRAMUSCULAR | Status: AC | PRN
  Administered 2012-08-07: 50 mL via ORAL

## 2012-08-07 MED ORDER — PANTOPRAZOLE SODIUM 40 MG IV SOLR
40.0000 mg | Freq: Once | INTRAVENOUS | Status: AC
  Administered 2012-08-07: 40 mg via INTRAVENOUS
  Filled 2012-08-07: qty 40

## 2012-08-07 MED ORDER — SODIUM CHLORIDE 0.9 % IV BOLUS (SEPSIS)
250.0000 mL | Freq: Once | INTRAVENOUS | Status: AC
  Administered 2012-08-07: 250 mL via INTRAVENOUS

## 2012-08-07 MED ORDER — IOHEXOL 300 MG/ML  SOLN
100.0000 mL | Freq: Once | INTRAMUSCULAR | Status: AC | PRN
  Administered 2012-08-07: 100 mL via INTRAVENOUS

## 2012-08-07 NOTE — ED Notes (Signed)
Pt states that she has chest pain in the central chest, onset of burning sensation in the upper central onset last night at 9pm.  Pt states that she takes zantac daily for this, no relief.  No cardiology history.

## 2012-08-07 NOTE — ED Provider Notes (Addendum)
History     CSN: [id number]  Arrival date & time 08/07/12  1138   First MD Initiated Contact with Patient 08/07/12 1228      Chief Complaint  Patient presents with  . Chest Pain    (Consider location/radiation/quality/duration/timing/severity/associated sxs/prior treatment) The history is provided by the patient.   67 year old female with onset at 9 PM last evening of substernal chest pain. Described as a burning sensation with an acid taste in her mouth. Patient has had trouble with reflux disease in the past but never quite this severe. In the past she is to treat herself with Zantac. Patient also has a complaint of periumbilical abdominal discomfort or pain that she would rate as a 3-5/10. It is nonradiating. No distinct complaint of epigastric abdominal pain. The symptoms she's having in her chest does remind her of her reflux disease problems but has never been this extensive. Pain is not made better or worse by anything. No nausea no vomiting no diarrhea no fever no shortness of breath.  Past Medical History  Diagnosis Date  . TMJ (dislocation of temporomandibular joint)   . Bronchitis   . Carpal tunnel syndrome     bilateral  . Chronic back pain     herniated disc,stenosis,radiculopathy,spondylolisthesis  . Chronic neck pain   . Eczema   . Hiatal hernia   . Constipation, chronic   . Hemorrhoids   . IBS (irritable bowel syndrome)   . Anxiety     valium prn  . Depression   . History of bronchitis     "I've had it a number of times"  . GERD (gastroesophageal reflux disease)   . Arthritis     "little in my fingers; shoulders"  . Enteritis 09/30/11    "localized into one section of small bowel"    Past Surgical History  Procedure Laterality Date  . Tonsillectomy      1951  . Appendectomy      1967  . Knee arthroscopy  1994    "don't remember which side"  . Incision and drainage abscess / hematoma of bursa / knee / thigh  1994    "day after knee scope"  . Back  surgery    . Posterior fusion lumbar spine  02/2011    L3-L5  . Dilation and curettage of uterus  1988  . Breast reduction surgery  1988  . Breast biopsy  1980's    "twice on the left"  . Bladder suspension  1980's    "wasn't successful"    Family History  Problem Relation Age of Onset  . Anesthesia problems Neg Hx   . Hypotension Neg Hx   . Malignant hyperthermia Neg Hx   . Pseudochol deficiency Neg Hx   . Kidney disease Father   . Colon cancer Maternal Grandfather     questionable    History  Substance Use Topics  . Smoking status: Former Smoker -- 4 years    Types: Cigarettes    Quit date: 04/14/1968  . Smokeless tobacco: Never Used     Comment: "just a social smoker for ~ 4 yrs"  . Alcohol Use: 3.5 oz/week    5 Glasses of wine, 1 Drinks containing 0.5 oz of alcohol per week    OB History   Grav Para Term Preterm Abortions TAB SAB Ect Mult Living                  Review of Systems  Constitutional: Negative for fever.  HENT:  Negative for congestion and neck pain.   Eyes: Negative for redness.  Respiratory: Negative for shortness of breath.   Cardiovascular: Positive for chest pain.  Gastrointestinal: Positive for abdominal pain. Negative for nausea, vomiting and diarrhea.  Genitourinary: Negative for dysuria.  Musculoskeletal: Negative for myalgias and back pain.  Skin: Negative for rash.  Neurological: Negative for headaches.  Hematological: Does not bruise/bleed easily.  Psychiatric/Behavioral: Negative for confusion.    Allergies  Adhesive  Home Medications   Current Outpatient Rx  Name  Route  Sig  Dispense  Refill  . ergocalciferol (VITAMIN D2) 50000 UNITS capsule   Oral   Take 50,000 mg by mouth once a week.          . Estradiol-Norethindrone Acet 0.5-0.1 MG per tablet   Oral   Take 1 tablet by mouth daily.         Marland Kitchen Fexofenadine HCl (ALLEGRA PO)   Oral   Take by mouth daily.         Bertram Gala Glycol-Propyl Glycol (SYSTANE)  0.4-0.3 % SOLN   Both Eyes   Place 1 drop into both eyes daily as needed. For dry eyes         . polyethylene glycol (MIRALAX / GLYCOLAX) packet   Oral   Take 17 g by mouth daily as needed. For constipation   14 each      . zolpidem (AMBIEN) 10 MG tablet   Oral   Take 3.33 mg by mouth at bedtime as needed. For sleep         . dicyclomine (BENTYL) 10 MG capsule   Oral   Take 1 capsule (10 mg total) by mouth 2 (two) times daily.   60 capsule   3   . Probiotic Product (ALIGN) 4 MG CAPS   Oral   Take 1 capsule by mouth daily.   14 capsule   0     BP 124/73  Pulse 75  Temp(Src) 97.5 F (36.4 C) (Oral)  Resp 14  Ht 5\' 5"  (1.651 m)  Wt 152 lb (68.947 kg)  BMI 25.29 kg/m2  SpO2 98%  Physical Exam  Nursing note and vitals reviewed. Constitutional: She is oriented to person, place, and time. She appears well-developed and well-nourished. No distress.  HENT:  Head: Normocephalic and atraumatic.  Mouth/Throat: Oropharynx is clear and moist.  Eyes: Conjunctivae and EOM are normal. Pupils are equal, round, and reactive to light.  Neck: Normal range of motion.  Cardiovascular: Normal rate, regular rhythm, normal heart sounds and intact distal pulses.   No murmur heard. Pulmonary/Chest: Effort normal and breath sounds normal.  Abdominal: Soft. There is tenderness.  Periumbilical tenderness to palpation. No guarding  Musculoskeletal: Normal range of motion. She exhibits no edema.  Neurological: She is alert and oriented to person, place, and time. No cranial nerve deficit. She exhibits normal muscle tone. Coordination normal.  Skin: Skin is warm. No rash noted.    ED Course  Procedures (including critical care time)  Labs Reviewed  URINALYSIS, ROUTINE W REFLEX MICROSCOPIC - Abnormal; Notable for the following:    APPearance CLOUDY (*)    Leukocytes, UA SMALL (*)    All other components within normal limits  URINE MICROSCOPIC-ADD ON - Abnormal; Notable for the  following:    Squamous Epithelial / LPF FEW (*)    All other components within normal limits  CBC  BASIC METABOLIC PANEL  TROPONIN I  HEPATIC FUNCTION PANEL  LIPASE, BLOOD  TROPONIN I  Dg Chest 2 View  08/07/2012  *RADIOLOGY REPORT*  Clinical Data: Chest pain.  CHEST - 2 VIEW  Comparison: Chest CT 02/28/2009.  Findings: Lung volumes are normal.  No consolidative airspace disease.  No pleural effusions.  No pneumothorax.  No pulmonary nodule or mass noted.  Pulmonary vasculature and the cardiomediastinal silhouette are within normal limits.  IMPRESSION: 1. No radiographic evidence of acute cardiopulmonary disease.   Original Report Authenticated By: Trudie Reed, M.D.    Results for orders placed during the hospital encounter of 08/07/12  CBC      Result Value Range   WBC 6.9  4.0 - 10.5 K/uL   RBC 4.63  3.87 - 5.11 MIL/uL   Hemoglobin 14.9  12.0 - 15.0 g/dL   HCT 40.9  81.1 - 91.4 %   MCV 93.5  78.0 - 100.0 fL   MCH 32.2  26.0 - 34.0 pg   MCHC 34.4  30.0 - 36.0 g/dL   RDW 78.2  95.6 - 21.3 %   Platelets 296  150 - 400 K/uL  BASIC METABOLIC PANEL      Result Value Range   Sodium 135  135 - 145 mEq/L   Potassium 3.7  3.5 - 5.1 mEq/L   Chloride 100  96 - 112 mEq/L   CO2 24  19 - 32 mEq/L   Glucose, Bld 98  70 - 99 mg/dL   BUN 14  6 - 23 mg/dL   Creatinine, Ser 0.86  0.50 - 1.10 mg/dL   Calcium 8.7  8.4 - 57.8 mg/dL   GFR calc non Af Amer >90  >90 mL/min   GFR calc Af Amer >90  >90 mL/min  TROPONIN I      Result Value Range   Troponin I <0.30  <0.30 ng/mL  HEPATIC FUNCTION PANEL      Result Value Range   Total Protein 6.3  6.0 - 8.3 g/dL   Albumin 3.7  3.5 - 5.2 g/dL   AST 27  0 - 37 U/L   ALT 21  0 - 35 U/L   Alkaline Phosphatase 74  39 - 117 U/L   Total Bilirubin 0.6  0.3 - 1.2 mg/dL   Bilirubin, Direct <4.6  0.0 - 0.3 mg/dL   Indirect Bilirubin NOT CALCULATED  0.3 - 0.9 mg/dL  LIPASE, BLOOD      Result Value Range   Lipase 24  11 - 59 U/L  URINALYSIS, ROUTINE W  REFLEX MICROSCOPIC      Result Value Range   Color, Urine YELLOW  YELLOW   APPearance CLOUDY (*) CLEAR   Specific Gravity, Urine 1.007  1.005 - 1.030   pH 6.0  5.0 - 8.0   Glucose, UA NEGATIVE  NEGATIVE mg/dL   Hgb urine dipstick NEGATIVE  NEGATIVE   Bilirubin Urine NEGATIVE  NEGATIVE   Ketones, ur NEGATIVE  NEGATIVE mg/dL   Protein, ur NEGATIVE  NEGATIVE mg/dL   Urobilinogen, UA 0.2  0.0 - 1.0 mg/dL   Nitrite NEGATIVE  NEGATIVE   Leukocytes, UA SMALL (*) NEGATIVE  URINE MICROSCOPIC-ADD ON      Result Value Range   Squamous Epithelial / LPF FEW (*) RARE   WBC, UA 3-6  <3 WBC/hpf   RBC / HPF 0-2  <3 RBC/hpf    Date: 08/07/2012  Rate: 89  Rhythm: normal sinus rhythm  QRS Axis: normal  Intervals: normal  ST/T Wave abnormalities: normal  Conduction Disutrbances:none  Narrative Interpretation:   Old EKG  Reviewed: unchanged No significant change in EKG compared to 10/01/2011   1. Abdominal pain   2. Acid reflux disease       MDM  Initially had recommended doing a CT abdomen pelvis. Patient was leery of having that done since she's had repeated CTs of her chest in the past for following a nodule that ends up being benign. Patient wanted to get basic labs first day her back they are negative discussed again with patient she is now agreed to do CT scan so that was ordered the leg. Labs with the exception of the urinalysis which showed 3-6 white blood cells of were very normal no leukocytosis. Lipase was not consistent with pancreatitis or elevated liver function tests were normal. Electrolytes including renal function were normal. If CT scan is negative this may very well be acid reflux disease scan was desired because of the periumbilical abdominal pain. Second troponin is still pending first troponin was normal do not feel that this is acute cardiac event based on EKG and the fact that symptoms started last night at 9:00 would expect first troponin to be positive per second is  important. Patient returned over to Dr. Jonna Clark will followup on the CT scan.  Patient with some improvement here in the emergency probably protonix would send patient home with oral protonix.  The patient has primary care Dr. to followup with, she is followed by Dr. Earl Gala.        Shelda Jakes, MD 08/07/12 1533  Addendum: Patient's second troponin is also negative. Just awaiting CT scan results.  Shelda Jakes, MD 08/07/12 [phone]

## 2012-08-07 NOTE — ED Notes (Signed)
Called prescription to [facility], [facility] [address] [place] [phone] at patients request

## 2012-08-07 NOTE — ED Provider Notes (Signed)
Care assumed from Dr. Jodelle Gross at 4 PM. Awaiting CT scan of abdomen.  CT negative for acute pathology. We'll discharge on protonix per Dr. Darlyn Chamber plan. BP 128/79  Pulse 91  Temp(Src) 97.5 F (36.4 C) (Oral)  Resp 18  Ht 5\' 5"  (1.651 m)  Wt 152 lb (68.947 kg)  BMI 25.29 kg/m2  SpO2 98%   Glynn Octave, MD 08/07/12 1750

## 2012-09-24 ENCOUNTER — Emergency Department (HOSPITAL_COMMUNITY)
Admission: EM | Admit: 2012-09-24 | Discharge: 2012-09-24 | Disposition: A | Discharge status: Home / Self Care | Payer: Medicare Other | Attending: Emergency Medicine | Admitting: Emergency Medicine

## 2012-09-24 ENCOUNTER — Encounter (HOSPITAL_COMMUNITY): Payer: Self-pay | Admitting: *Deleted

## 2012-09-24 DIAGNOSIS — Z87891 Personal history of nicotine dependence: Secondary | ICD-10-CM | POA: Insufficient documentation

## 2012-09-24 DIAGNOSIS — Z87828 Personal history of other (healed) physical injury and trauma: Secondary | ICD-10-CM | POA: Insufficient documentation

## 2012-09-24 DIAGNOSIS — Z8669 Personal history of other diseases of the nervous system and sense organs: Secondary | ICD-10-CM | POA: Insufficient documentation

## 2012-09-24 DIAGNOSIS — Z8719 Personal history of other diseases of the digestive system: Secondary | ICD-10-CM | POA: Insufficient documentation

## 2012-09-24 DIAGNOSIS — M549 Dorsalgia, unspecified: Secondary | ICD-10-CM | POA: Insufficient documentation

## 2012-09-24 DIAGNOSIS — K5900 Constipation, unspecified: Secondary | ICD-10-CM | POA: Insufficient documentation

## 2012-09-24 DIAGNOSIS — K219 Gastro-esophageal reflux disease without esophagitis: Secondary | ICD-10-CM | POA: Insufficient documentation

## 2012-09-24 DIAGNOSIS — S0003XA Contusion of scalp, initial encounter: Secondary | ICD-10-CM | POA: Insufficient documentation

## 2012-09-24 DIAGNOSIS — Z872 Personal history of diseases of the skin and subcutaneous tissue: Secondary | ICD-10-CM | POA: Insufficient documentation

## 2012-09-24 DIAGNOSIS — S0180XA Unspecified open wound of other part of head, initial encounter: Secondary | ICD-10-CM | POA: Insufficient documentation

## 2012-09-24 DIAGNOSIS — Z8659 Personal history of other mental and behavioral disorders: Secondary | ICD-10-CM | POA: Insufficient documentation

## 2012-09-24 DIAGNOSIS — Z8709 Personal history of other diseases of the respiratory system: Secondary | ICD-10-CM | POA: Insufficient documentation

## 2012-09-24 DIAGNOSIS — S0181XA Laceration without foreign body of other part of head, initial encounter: Secondary | ICD-10-CM

## 2012-09-24 DIAGNOSIS — S1093XA Contusion of unspecified part of neck, initial encounter: Secondary | ICD-10-CM | POA: Insufficient documentation

## 2012-09-24 DIAGNOSIS — Y92009 Unspecified place in unspecified non-institutional (private) residence as the place of occurrence of the external cause: Secondary | ICD-10-CM | POA: Insufficient documentation

## 2012-09-24 DIAGNOSIS — M542 Cervicalgia: Secondary | ICD-10-CM | POA: Insufficient documentation

## 2012-09-24 DIAGNOSIS — M129 Arthropathy, unspecified: Secondary | ICD-10-CM | POA: Insufficient documentation

## 2012-09-24 DIAGNOSIS — S0083XA Contusion of other part of head, initial encounter: Secondary | ICD-10-CM

## 2012-09-24 DIAGNOSIS — Z8679 Personal history of other diseases of the circulatory system: Secondary | ICD-10-CM | POA: Insufficient documentation

## 2012-09-24 DIAGNOSIS — Z79899 Other long term (current) drug therapy: Secondary | ICD-10-CM | POA: Insufficient documentation

## 2012-09-24 DIAGNOSIS — Y9389 Activity, other specified: Secondary | ICD-10-CM | POA: Insufficient documentation

## 2012-09-24 DIAGNOSIS — W208XXA Other cause of strike by thrown, projected or falling object, initial encounter: Secondary | ICD-10-CM | POA: Insufficient documentation

## 2012-09-24 DIAGNOSIS — G8929 Other chronic pain: Secondary | ICD-10-CM | POA: Insufficient documentation

## 2012-09-24 MED ORDER — BACITRACIN ZINC 500 UNIT/GM EX OINT: TOPICAL_OINTMENT | Freq: Two times a day (BID) | CUTANEOUS | Status: DC

## 2012-09-24 MED ORDER — TETANUS-DIPHTH-ACELL PERTUSSIS 5-2.5-18.5 LF-MCG/0.5 IM SUSP: 0.5000 mL | Freq: Once | INTRAMUSCULAR | Status: DC

## 2012-09-24 NOTE — Discharge Instructions (Signed)
Keep wound clean.  Have sutures removed, your doctor or urgent care, in 5-7 days. Return to ER if worse, severe headache, infection of wound, vomiting, new pain/symptoms, other concern.    Facial Laceration A facial laceration is a cut on the face. Lacerations usually heal quickly, but they need special care to reduce scarring. It will take 1 to 2 years for the scar to lose its redness and to heal completely. TREATMENT  Some facial lacerations may not require closure. Some lacerations may not be able to be closed due to an increased risk of infection. It is important to see your caregiver as soon as possible after an injury to minimize the risk of infection and to maximize the opportunity for successful closure. If closure is appropriate, pain medicines may be given, if needed. The wound will be cleaned to help prevent infection. Your caregiver will use stitches (sutures), staples, wound glue (adhesive), or skin adhesive strips to repair the laceration. These tools bring the skin edges together to allow for faster healing and a better cosmetic outcome. However, all wounds will heal with a scar.  Once the wound has healed, scarring can be minimized by covering the wound with sunscreen during the day for 1 full year. Use a sunscreen with an SPF of at least 30. Sunscreen helps to reduce the pigment that will form in the scar. When applying sunscreen to a completely healed wound, massage the scar for a few minutes to help reduce the appearance of the scar. Use circular motions with your fingertips, on and around the scar. Do not massage a healing wound. HOME CARE INSTRUCTIONS For sutures:  Keep the wound clean and dry.  If you were given a bandage (dressing), you should change it at least once a day. Also change the dressing if it becomes wet or dirty, or as directed by your caregiver.  Wash the wound with soap and water 2 times a day. Rinse the wound off with water to remove all soap. Pat the wound  dry with a clean towel.  After cleaning, apply a thin layer of the antibiotic ointment recommended by your caregiver. This will help prevent infection and keep the dressing from sticking.  You may shower as usual after the first 24 hours. Do not soak the wound in water until the sutures are removed.  Only take over-the-counter or prescription medicines for pain, discomfort, or fever as directed by your caregiver.  Get your sutures removed as directed by your caregiver. With facial lacerations, sutures should usually be taken out after 4 to 5 days to avoid stitch marks.  Wait a few days after your sutures are removed before applying makeup. For skin adhesive strips:  Keep the wound clean and dry.  Do not get the skin adhesive strips wet. You may bathe carefully, using caution to keep the wound dry.  If the wound gets wet, pat it dry with a clean towel.  Skin adhesive strips will fall off on their own. You may trim the strips as the wound heals. Do not remove skin adhesive strips that are still stuck to the wound. They will fall off in time. For wound adhesive:  You may briefly wet your wound in the shower or bath. Do not soak or scrub the wound. Do not swim. Avoid periods of heavy perspiration until the skin adhesive has fallen off on its own. After showering or bathing, gently pat the wound dry with a clean towel.  Do not apply liquid medicine, cream  medicine, ointment medicine, or makeup to your wound while the skin adhesive is in place. This may loosen the film before your wound is healed.  If a dressing is placed over the wound, be careful not to apply tape directly over the skin adhesive. This may cause the adhesive to be pulled off before the wound is healed.  Avoid prolonged exposure to sunlight or tanning lamps while the skin adhesive is in place. Exposure to ultraviolet light in the first year will darken the scar.  The skin adhesive will usually remain in place for 5 to 10  days, then naturally fall off the skin. Do not pick at the adhesive film. You may need a tetanus shot if:  You cannot remember when you had your last tetanus shot.  You have never had a tetanus shot. If you get a tetanus shot, your arm may swell, get red, and feel warm to the touch. This is common and not a problem. If you need a tetanus shot and you choose not to have one, there is a rare chance of getting tetanus. Sickness from tetanus can be serious. SEEK IMMEDIATE MEDICAL CARE IF:  You develop redness, pain, or swelling around the wound.  There is yellowish-white fluid (pus) coming from the wound.  You develop chills or a fever. MAKE SURE YOU:  Understand these instructions.  Will watch your condition.  Will get help right away if you are not doing well or get worse. Document Released: 05/08/2004 Document Revised: 06/23/2011 Document Reviewed: 09/23/2010 [facility] Patient Information 2014 [place], [place].    Facial or Scalp Contusion A facial or scalp contusion is a deep bruise on the face or head. Injuries around the face and head generally cause a lot of swelling, especially around the eyes. Contusions are the result of an injury that caused bleeding under the skin. The contusion may turn blue, purple, or yellow. Minor injuries will give you a painless contusion, but more severe contusions may stay painful and swollen for a few weeks.  CAUSES  A facial or scalp contusion is caused by a blunt injury or trauma to the face or head area.  SYMPTOMS   Facial or scalp pain.  Facial, lip, or scalp swelling.  Facial or scalp bruising or tenderness. You may have a mild headache, slight dizziness, nausea, and weakness for a few days. This usually clears up with bed rest and mild pain medicines.  DIAGNOSIS  The diagnosis can be made by asking about your history and doing a physical exam. An X-ray, computed tomography (CT) scan, or magnetic resonance imaging (MRI) may be needed to  determine if there were any associated injuries, such as broken bones (fractures). TREATMENT  Often, the best treatment for a facial or scalp contusion is applying cold compresses to the injured area and eating a soft diet. Over-the-counter medicines may also be recommended for pain control.  HOME CARE INSTRUCTIONS   Put ice on the injured area.  Put ice in a plastic bag.  Place a towel between your skin and the bag.  Leave the ice on for 15-20 minutes, 3-4 times a day.  Only take over-the-counter or prescription medicines for pain, discomfort, or fever as directed by your caregiver. SEEK IMMEDIATE MEDICAL CARE IF:  You have severe pain or a headache that is not relieved by medicine.  You have unusual sleepiness, confusion, or personality changes.  You vomit.  You have a persistent nosebleed.  You have double vision or blurred vision.  You  have fluid drainage from your nose or ear.  You have difficulty walking or using your arms or legs.  You have bite problems.  You have pain with chewing.  You are concerned about facial defects. MAKE SURE YOU:   Understand these instructions.  Will watch your condition.  Will get help right away if you are not doing well or get worse. Document Released: 05/08/2004 Document Revised: 06/23/2011 Document Reviewed: 02/14/2011 [facility] Patient Information 2014 [place], [place].

## 2012-09-24 NOTE — ED Notes (Signed)
MD Steinl notified suture cart at bedside

## 2012-09-24 NOTE — ED Notes (Signed)
Pt reports a "baker's rack" fell on her head.  Pt denies LOC. Pt is A&O x 4.  Pt presented with 2 small bandages on her forehead.  Lac noted to L side forehead.  No active bleeding noted.  No neuro deficits noted at this time.

## 2012-09-24 NOTE — ED Notes (Signed)
Please see triage note

## 2012-09-24 NOTE — ED Provider Notes (Signed)
History     CSN: [id number]  Arrival date & time 09/24/12  0754   First MD Initiated Contact with Patient 09/24/12 [phone]      Chief complaint: contusion/laceration to head  (Consider location/radiation/quality/duration/timing/severity/associated sxs/prior treatment) The history is provided by the patient.  pt indicates dog at home ran into bakers rack causing a platter to fall from shelf and hit left forehead/scalp area. Laceration to area, bled briefly, stopped w pressure. Pain to lac area, localized, mild, constant. No loc. Did not fall to ground. No neck or back pain. No numbness/weakness. No headache. No nv. Tetanus unknown. Denies any other pain or injury.     Past Medical History  Diagnosis Date  . TMJ (dislocation of temporomandibular joint)   . Bronchitis   . Carpal tunnel syndrome     bilateral  . Chronic back pain     herniated disc,stenosis,radiculopathy,spondylolisthesis  . Chronic neck pain   . Eczema   . Hiatal hernia   . Constipation, chronic   . Hemorrhoids   . IBS (irritable bowel syndrome)   . Anxiety     valium prn  . Depression   . History of bronchitis     "I've had it a number of times"  . GERD (gastroesophageal reflux disease)   . Arthritis     "little in my fingers; shoulders"  . Enteritis 09/30/11    "localized into one section of small bowel"    Past Surgical History  Procedure Laterality Date  . Tonsillectomy      1951  . Appendectomy      1967  . Knee arthroscopy  1994    "don't remember which side"  . Incision and drainage abscess / hematoma of bursa / knee / thigh  1994    "day after knee scope"  . Back surgery    . Posterior fusion lumbar spine  02/2011    L3-L5  . Dilation and curettage of uterus  1988  . Breast reduction surgery  1988  . Breast biopsy  1980's    "twice on the left"  . Bladder suspension  1980's    "wasn't successful"    Family History  Problem Relation Age of Onset  . Anesthesia problems Neg Hx   .  Hypotension Neg Hx   . Malignant hyperthermia Neg Hx   . Pseudochol deficiency Neg Hx   . Kidney disease Father   . Colon cancer Maternal Grandfather     questionable    History  Substance Use Topics  . Smoking status: Former Smoker -- 4 years    Types: Cigarettes    Quit date: 04/14/1968  . Smokeless tobacco: Never Used     Comment: "just a social smoker for ~ 4 yrs"  . Alcohol Use: 3.5 oz/week    5 Glasses of wine, 1 Drinks containing 0.5 oz of alcohol per week    OB History   Grav Para Term Preterm Abortions TAB SAB Ect Mult Living                  Review of Systems  Constitutional: Negative for fever.  HENT: Negative for neck pain.   Eyes: Negative for visual disturbance.  Respiratory: Negative for shortness of breath.   Cardiovascular: Negative for chest pain.  Gastrointestinal: Negative for nausea, vomiting and abdominal pain.  Genitourinary: Negative for flank pain.  Musculoskeletal: Negative for back pain.  Skin: Negative for rash.  Neurological: Negative for weakness and numbness.  Hematological: Does not bruise/bleed  easily.  Psychiatric/Behavioral: Negative for confusion.    Allergies  Adhesive  Home Medications   Current Outpatient Rx  Name  Route  Sig  Dispense  Refill  . dicyclomine (BENTYL) 10 MG capsule   Oral   Take 1 capsule (10 mg total) by mouth 2 (two) times daily.   60 capsule   3   . ergocalciferol (VITAMIN D2) 50000 UNITS capsule   Oral   Take 50,000 mg by mouth once a week.          . Estradiol-Norethindrone Acet 0.5-0.1 MG per tablet   Oral   Take 1 tablet by mouth daily.         Marland Kitchen Fexofenadine HCl (ALLEGRA PO)   Oral   Take by mouth daily.         . pantoprazole (PROTONIX) 20 MG tablet   Oral   Take 1 tablet (20 mg total) by mouth daily.   30 tablet   0   . Polyethyl Glycol-Propyl Glycol (SYSTANE) 0.4-0.3 % SOLN   Both Eyes   Place 1 drop into both eyes daily as needed. For dry eyes         . polyethylene  glycol (MIRALAX / GLYCOLAX) packet   Oral   Take 17 g by mouth daily as needed. For constipation   14 each      . Probiotic Product (ALIGN) 4 MG CAPS   Oral   Take 1 capsule by mouth daily.   14 capsule   0   . zolpidem (AMBIEN) 10 MG tablet   Oral   Take 3.33 mg by mouth at bedtime as needed. For sleep           BP 153/82  Pulse 74  Temp(Src) 98.3 F (36.8 C) (Oral)  Resp 18  SpO2 96%  Physical Exam  Nursing note and vitals reviewed. Constitutional: She is oriented to person, place, and time. She appears well-developed and well-nourished. No distress.  HENT:  2 cm lac to left forehead. No active bleeding. No fb seen or felt.   Eyes: Conjunctivae are normal. Pupils are equal, round, and reactive to light. No scleral icterus.  Neck: Normal range of motion. Neck supple. No tracheal deviation present.  Cardiovascular: Normal rate.   Pulmonary/Chest: Effort normal. No respiratory distress.  Abdominal: Normal appearance. She exhibits no distension.  Musculoskeletal: She exhibits no edema.  Neurological: She is alert and oriented to person, place, and time.  Steady gait.   Skin: Skin is warm and dry. No rash noted.  Psychiatric: She has a normal mood and affect.    ED Course  Procedures (including critical care time)     MDM  Wound cleaned, sutured.  Spine nt.  LACERATION REPAIR Performed by: Suzi Roots Authorized by: Suzi Roots Consent: Verbal consent obtained. Risks and benefits: risks, benefits and alternatives were discussed Consent given by: patient Patient identity confirmed: provided demographic data Prepped and Draped in normal sterile fashion Wound explored  Laceration Location: left forehead  Laceration Length: 2 cm  No Foreign Bodies seen or palpated  Anesthesia: local infiltration  Local anesthetic: lidocaine 2% w epinephrine  Anesthetic total: 3 ml  Irrigation method: syringe Amount of cleaning: standard  Skin closure: 6-0  prolene  Number of sutures: 4  Technique: interrupted  Patient tolerance: Patient tolerated the procedure well with no immediate complications.   Tetanus updated.       Suzi Roots, MD 09/24/12 [phone]

## 2012-09-24 NOTE — ED Notes (Signed)
MD at bedside. Steinl EDP 

## 2012-12-06 ENCOUNTER — Encounter: Payer: Self-pay | Admitting: Obstetrics and Gynecology

## 2012-12-07 ENCOUNTER — Encounter: Payer: Self-pay | Admitting: Obstetrics and Gynecology

## 2012-12-07 ENCOUNTER — Ambulatory Visit (INDEPENDENT_AMBULATORY_CARE_PROVIDER_SITE_OTHER): Payer: Medicare Other | Admitting: Obstetrics and Gynecology

## 2012-12-07 VITALS — BP 122/70 | HR 82 | Resp 18 | Ht 65.0 in | Wt 153.0 lb

## 2012-12-07 DIAGNOSIS — Z01419 Encounter for gynecological examination (general) (routine) without abnormal findings: Secondary | ICD-10-CM

## 2012-12-07 MED ORDER — ESTRADIOL-NORETHINDRONE ACET 0.5-0.1 MG PO TABS: 1.0000 | ORAL_TABLET | Freq: Every morning | ORAL | Status: DC

## 2012-12-07 NOTE — Patient Instructions (Signed)

## 2012-12-07 NOTE — Progress Notes (Signed)
67 y.o.   Married    Caucasian   female   [id number]   here for annual exam.  GERD worsening this year.  Has started on protonix.  Had an episode of 5 days of diarrhea in June treated with Cipro with good resolution.    Patient's last menstrual period was 04/14/1997.          Sexually active: yes  The current method of family planning is status post hysterectomy.    Exercising: golf, tennis, walking 2-7 days a week Last mammogram:  07/2012 normal Last pap smear: 11/25/11 neg History of abnormal pap: many years ago Smoking: quit smoking 40+ Years ago Alcohol: 3-4 drinks a week alcohol or wine Last colonoscopy:2007 normal, repeat 5-7 years Last Bone Density:  Not sure Last tetanus shot:less than 10 years Last cholesterol check: 2008 normal  Hgb:      pcp          Urine: pcp   Family History  Problem Relation Age of Onset  . Anesthesia problems Neg Hx   . Hypotension Neg Hx   . Malignant hyperthermia Neg Hx   . Pseudochol deficiency Neg Hx   . Kidney disease Father   . Rheum arthritis Father   . Colon cancer Maternal Grandfather     questionable  . Parkinson's disease Mother     Patient Active Problem List   Diagnosis Date Noted  . Flank pain 10/03/2011  . Abdominal pain 09/30/2011  . DEGENERATIVE DISC DISEASE, LUMBAR SPINE 05/07/2010  . BACK PAIN, LUMBAR 05/07/2010  . Abdominal pain, unspecified site 06/08/2009  . RLQ PAIN 03/30/2009  . ABNORMAL FINDINGS GI TRACT 03/30/2009  . ANXIETY 03/22/2009  . DEPRESSION 03/22/2009  . GERD 03/22/2009  . CONSTIPATION 03/22/2009  . FLATULENCE-GAS-BLOATING 03/22/2009  . PERSONAL HX COLONIC POLYPS 03/22/2009    Past Medical History  Diagnosis Date  . TMJ (dislocation of temporomandibular joint)   . Bronchitis   . Carpal tunnel syndrome     bilateral  . Chronic back pain     herniated disc,stenosis,radiculopathy,spondylolisthesis  . Chronic neck pain   . Eczema   . Hiatal hernia   . Constipation, chronic   . Hemorrhoids   . IBS  (irritable bowel syndrome)     lactose intolerant  . Anxiety     valium prn  . Depression   . History of bronchitis     "I've had it a number of times"  . GERD (gastroesophageal reflux disease)   . Arthritis     "little in my fingers; shoulders"  . Enteritis 09/30/11    "localized into one section of small bowel"    Past Surgical History  Procedure Laterality Date  . Tonsillectomy      1951  . Appendectomy      1967  . Knee arthroscopy  1994    "don't remember which side"  . Incision and drainage abscess / hematoma of bursa / knee / thigh  1994    "day after knee scope"  . Back surgery    . Posterior fusion lumbar spine  02/2011    L3-L5  . Dilation and curettage of uterus  1988  . Breast reduction surgery  1988  . Breast biopsy  1980's    "twice on the left"  . Bladder suspension  1980's    "wasn't successful"    Allergies: Adhesive  Current Outpatient Prescriptions  Medication Sig Dispense Refill  . ergocalciferol (VITAMIN D2) 50000 UNITS capsule Take 50,000 mg  by mouth every 14 (fourteen) days. She takes on Mondays.      . Estradiol-Norethindrone Acet 0.5-0.1 MG per tablet Take 1 tablet by mouth every morning.       . fluorouracil (EFUDEX) 5 % cream       . methylphenidate (CONCERTA) 27 MG CR tablet       . metroNIDAZOLE (METROGEL) 1 % gel       . pantoprazole (PROTONIX) 20 MG tablet Take 20 mg by mouth every morning.      Bertram Gala Glycol-Propyl Glycol (SYSTANE) 0.4-0.3 % SOLN Place 1 drop into both eyes daily as needed. For dry eyes      . polyethylene glycol (MIRALAX / GLYCOLAX) packet Take 17 g by mouth daily as needed. For constipation  14 each    . zolpidem (AMBIEN) 10 MG tablet Take 3.33 mg by mouth at bedtime as needed. For sleep      . fexofenadine (ALLEGRA) 180 MG tablet Take 180 mg by mouth at bedtime.      . naproxen sodium (ALEVE) 220 MG tablet Take 220 mg by mouth every 8 (eight) hours as needed (For pain.).      Marland Kitchen triamcinolone (NASACORT AQ) 55  MCG/ACT nasal inhaler Place 1 spray into the nose every morning.       No current facility-administered medications for this visit.    ROS: Pertinent items are noted in HPI.  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, homemaker    Exam:    BP 122/70  Pulse 82  Resp 18  Ht 5\' 5"  (1.651 m)  Wt 153 lb (69.4 kg)  BMI 25.46 kg/m2  LMP 04/14/1997  Ht stable and wt up 3 pounds from last year Wt Readings from Last 3 Encounters:  12/07/12 153 lb (69.4 kg)  08/07/12 152 lb (68.947 kg)  10/21/11 148 lb (67.132 kg)     Ht Readings from Last 3 Encounters:  12/07/12 5\' 5"  (1.651 m)  08/07/12 5\' 5"  (1.651 m)  10/21/11 5\' 5"  (1.651 m)    General appearance: alert, cooperative and appears stated age Head: Normocephalic, without obvious abnormality, atraumatic Neck: no adenopathy, supple, symmetrical, trachea midline and thyroid not enlarged, symmetric, no tenderness/mass/nodules Lungs: clear to auscultation bilaterally Breasts: Inspection negative, No nipple retraction or dimpling, No nipple discharge or bleeding, No axillary or supraclavicular adenopathy, Normal to palpation without dominant masses Heart: regular rate and rhythm Abdomen: soft, non-tender; bowel sounds normal; no masses,  no organomegaly Extremities: extremities normal, atraumatic, no cyanosis or edema Skin: Skin color, texture, turgor normal. No rashes or lesions Lymph nodes: Cervical, supraclavicular, and axillary nodes normal. No abnormal inguinal nodes palpated Neurologic: Grossly normal   Pelvic: External genitalia:  no lesions              Urethra:  normal appearing urethra with no masses, tenderness or lesions              Bartholins and Skenes: normal                 Vagina: normal appearing vagina with normal color and discharge, no lesions              Cervix: normal appearance              Pap taken: no        Bimanual Exam:  Uterus:  uterus is normal size, shape, consistency and nontender  Adnexa: normal adnexa in size, nontender and no masses                                      Rectovaginal: Confirms                                      Anus:  normal sphincter tone, no lesions  A: normal menopausal exam, on HRT     P:     mammogram counseled on breast self exam, mammography screening, adequate intake of calcium and vitamin D, diet and exercise return annually or prn   Again discussed risks and benefits of HRT use for the long term including risks of breast/ov cancer and clotting - pt elects to continue - she feels good on HRT and wants to continue.  RF activella for one year.     An After Visit Summary was printed and given to the patient.

## 2013-01-23 ENCOUNTER — Other Ambulatory Visit: Payer: Self-pay | Admitting: Certified Nurse Midwife

## 2013-02-16 ENCOUNTER — Other Ambulatory Visit: Payer: Self-pay | Admitting: Certified Nurse Midwife

## 2013-02-16 ENCOUNTER — Other Ambulatory Visit: Payer: Self-pay | Admitting: Obstetrics and Gynecology

## 2013-02-16 DIAGNOSIS — E559 Vitamin D deficiency, unspecified: Secondary | ICD-10-CM

## 2013-02-16 NOTE — Telephone Encounter (Signed)
Please contact the patient and have her come in to the office for a Vitamin D level check.  I will put in an order.   We can then determine if prescription vitamin D is still appropriate.  It is due to be measured.

## 2013-02-16 NOTE — Telephone Encounter (Signed)
eScribe request for refill on VITAMIN D 91478 Last filled - 11/25/11 QOW, X 1 YEAR Last AEX - 12/07/12 with Dr. Tresa Res Next AEX - 12/08/13 with Dr. Edward Jolly Last Vitamin D check 08/29/08 = 53 Please advise refills if necessary.  Thanks.

## 2013-02-18 NOTE — Telephone Encounter (Signed)
Pt is not taking vitamin d 50K.  She states she has been taking 2000 IU OTC per Dr. Tresa Res for several months now and did not request this RX refill.  Pt will hold off on lab work for now.

## 2013-06-02 ENCOUNTER — Telehealth: Payer: Self-pay

## 2013-06-02 ENCOUNTER — Encounter: Payer: Self-pay | Admitting: Podiatry

## 2013-06-02 ENCOUNTER — Ambulatory Visit (INDEPENDENT_AMBULATORY_CARE_PROVIDER_SITE_OTHER): Payer: Medicare Other | Admitting: Podiatry

## 2013-06-02 VITALS — BP 135/73 | HR 81 | Resp 16

## 2013-06-02 DIAGNOSIS — L84 Corns and callosities: Secondary | ICD-10-CM

## 2013-06-02 DIAGNOSIS — M2010 Hallux valgus (acquired), unspecified foot: Secondary | ICD-10-CM

## 2013-06-02 NOTE — Progress Notes (Signed)
   Subjective:    Patient ID: Sherry [name], female    DOB: November 12, 1945, 68 y.o.   MRN: [id number]  HPI Comments: "This corn that he trims every few years"  Patient c/o callused area plantar forefoot left for several years. Has started to thicken up again recently. Usually has trimmed every few years by Dr. Paulla Dolly.  Last visit was 06-03-2010     Review of Systems  All other systems reviewed and are negative.       Objective:   Physical Exam        Assessment & Plan:

## 2013-06-02 NOTE — Telephone Encounter (Signed)
Opened in error

## 2013-06-02 NOTE — Progress Notes (Signed)
Subjective:     Patient ID: Sherry [name], female   DOB: 04/01/1946, 68 y.o.   MRN: [id number]  HPI patient presents with structural bunion deformity of both feet and keratotic lesion sub-fourth metatarsal left which can become symptomatic over a period of time   Review of Systems  All other systems reviewed and are negative.       Objective:   Physical Exam  Nursing note and vitals reviewed. Constitutional: She is oriented to person, place, and time.  Cardiovascular: Intact distal pulses.   Musculoskeletal: Normal range of motion.  Neurological: She is oriented to person, place, and time.  Skin: Skin is warm.   neurovascular status intact with muscle strength adequate and no equinus condition and normal range of motion subtalar midtarsal joint. 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 No iatrogenic bleeding and reappoint as needed

## 2013-06-02 NOTE — Telephone Encounter (Signed)
Pt notified that estradiol/norethindrone acetate 0.5mg -0.1mg  tablet was approved by prior authorization & okay to pick up from pharmacy

## 2013-06-08 ENCOUNTER — Telehealth: Payer: Self-pay | Admitting: Certified Nurse Midwife

## 2013-06-08 NOTE — Telephone Encounter (Signed)
T/C to [facility] checking on refill/ prior approval pt has a deductible  And her Cost to pick up will be about 83.00  Notified patient pharmacy to fill Rx.

## 2013-06-08 NOTE — Telephone Encounter (Signed)
Danie Binder, CMA at 06/02/2013 5:19 PM     Status: Signed        Pt notified that estradiol/norethindrone acetate 0.5mg -0.1mg  tablet was approved by prior authorization & okay to pick up from pharmacy   Patient said this is not at pharmacy can you check?

## 2013-07-01 ENCOUNTER — Encounter: Payer: Self-pay | Admitting: *Deleted

## 2013-07-07 ENCOUNTER — Other Ambulatory Visit: Payer: Self-pay | Admitting: Obstetrics and Gynecology

## 2013-07-07 DIAGNOSIS — Z1231 Encounter for screening mammogram for malignant neoplasm of breast: Secondary | ICD-10-CM

## 2013-08-02 ENCOUNTER — Ambulatory Visit (INDEPENDENT_AMBULATORY_CARE_PROVIDER_SITE_OTHER): Payer: Medicare Other | Admitting: Internal Medicine

## 2013-08-02 ENCOUNTER — Encounter: Payer: Self-pay | Admitting: Internal Medicine

## 2013-08-02 VITALS — BP 118/74 | HR 88 | Ht 65.0 in | Wt 154.1 lb

## 2013-08-02 DIAGNOSIS — K625 Hemorrhage of anus and rectum: Secondary | ICD-10-CM

## 2013-08-02 DIAGNOSIS — K5100 Ulcerative (chronic) pancolitis without complications: Secondary | ICD-10-CM

## 2013-08-02 DIAGNOSIS — K219 Gastro-esophageal reflux disease without esophagitis: Secondary | ICD-10-CM

## 2013-08-02 MED ORDER — PANTOPRAZOLE SODIUM 40 MG PO TBEC: 40.0000 mg | DELAYED_RELEASE_TABLET | Freq: Every day | ORAL | Status: DC

## 2013-08-02 NOTE — Progress Notes (Signed)
Sherry [name] Apr 16, 1945 [id number]  Note: This dictation was prepared with Dragon digital system. Any transcriptional errors that result from this procedure are unintentional.   History of Present Illness:  This is a 68 year old white female with the several month history of increasing heartburn and gastroesophageal reflux which occurs mostly at night. She has been on Protonix 20 mg every morning. She denies dysphagia. At times the pain is rather severe. She had an upper endoscopy in November 2007 with findings of 3 cm hiatal hernia. She was hospitalized in June 2952 with periumbilical abdominal pain and questionable ischemic injury to the small bowel. CT scan at that time shows abnormal loops of the small bowel concerning for enteritis. Colonoscopy in June 2007  was normal. She had hyperplastic polyp in 2001. There is a family history of colon cancer in maternal grandfather. Patient doesn't smoke and doesn't drink alcohol excessively. She does not use anti-inflammatory agents. Her weight this has been stable. She is concerned about possibility of Barrett's esophagus    Past Medical History  Diagnosis Date  . TMJ (dislocation of temporomandibular joint)   . Bronchitis   . Carpal tunnel syndrome     bilateral  . Chronic back pain     herniated disc,stenosis,radiculopathy,spondylolisthesis  . Chronic neck pain   . Eczema   . Hiatal hernia   . Constipation, chronic   . Hemorrhoids   . IBS (irritable bowel syndrome)     lactose intolerant  . Anxiety     valium prn  . Depression   . History of bronchitis     "I've had it a number of times"  . GERD (gastroesophageal reflux disease)   . Arthritis     "little in my fingers; shoulders"  . Enteritis 09/30/11    "localized into one section of small bowel"  . Hiatal hernia     Past Surgical History  Procedure Laterality Date  . Tonsillectomy      1951  . Appendectomy      1967  . Knee arthroscopy  1994    "don't remember which  side"  . Incision and drainage abscess / hematoma of bursa / knee / thigh  1994    "day after knee scope"  . Back surgery    . Posterior fusion lumbar spine  02/2011    L3-L5  . Dilation and curettage of uterus  1988  . Breast reduction surgery  1988  . Breast biopsy  1980's    "twice on the left"  . Bladder suspension  1980's    "wasn't successful"    Allergies  Allergen Reactions  . Adhesive [Tape] Other (See Comments)    Blisters; "gets puffy and red"    Family history and social history have been reviewed.  Review of Systems: Positive for heartburn negative for dysphagia  The remainder of the 10 point ROS is negative except as outlined in the H&P  Physical Exam: General Appearance Well developed, in no distress Eyes  Non icteric  HEENT  Non traumatic, normocephalic  Mouth No lesion, tongue papillated, no cheilosis Neck Supple without adenopathy, thyroid not enlarged, no carotid bruits, no JVD Lungs Clear to auscultation bilaterally COR Normal S1, normal S2, regular rhythm, no murmur, quiet precordium Abdomen mild tenderness in epigastrium. Normoactive bowel sounds. Lower abdomen unremarkable Rectal not done Extremities  No pedal edema Skin No lesions Neurological Alert and oriented x 3 Psychological Normal mood and affect  Assessment and Plan:   68 year old white female with the  increasing heartburn and indigestion within last several months. She has been on Protonix 20 mg daily but may not of have adequate acid suppression. She needs to follow strict antireflux measures. We will increase Protonix to 40 mg at bedtime and ranitidine 150 mg in the morning. She will be scheduled for upper endoscopy to followup on the hiatal hernia and then to rule out Barrett's esophagus  Colorectal screening. She'll be due for recall colonoscopy in June 2017    Sherry [name] Sherry [name] 08/02/2013

## 2013-08-02 NOTE — Patient Instructions (Addendum)
It has been recommended to you by your physician that you have a(n) endoscopy completed. Per your request, we did not schedule the procedure(s) today. Please contact our office at [phone] when you decide to have the procedure completed.  We have sent the following medications to your pharmacy for you to pick up at your convenience: Protonix 40 mg every night.  Please purchase the following medications over the counter and take as directed: Zantac 150 mg every morning  CC:Dr Nehemiah Settle

## 2013-08-05 ENCOUNTER — Ambulatory Visit (HOSPITAL_COMMUNITY)
Admission: RE | Admit: 2013-08-05 | Discharge: 2013-08-05 | Disposition: A | Discharge status: Home / Self Care | Payer: Medicare Other | Source: Ambulatory Visit | Attending: Obstetrics and Gynecology | Admitting: Obstetrics and Gynecology

## 2013-08-05 ENCOUNTER — Ambulatory Visit (HOSPITAL_COMMUNITY): Payer: Medicare Other

## 2013-08-05 DIAGNOSIS — Z1231 Encounter for screening mammogram for malignant neoplasm of breast: Secondary | ICD-10-CM | POA: Insufficient documentation

## 2013-09-29 ENCOUNTER — Telehealth: Payer: Self-pay | Admitting: Internal Medicine

## 2013-09-29 NOTE — Telephone Encounter (Signed)
Patient has scheduled an EGD and previsit.

## 2013-10-13 ENCOUNTER — Encounter: Payer: Self-pay | Admitting: Podiatry

## 2013-10-13 ENCOUNTER — Ambulatory Visit (INDEPENDENT_AMBULATORY_CARE_PROVIDER_SITE_OTHER): Payer: Medicare Other | Admitting: Podiatry

## 2013-10-13 VITALS — BP 130/72 | HR 79 | Resp 16

## 2013-10-13 DIAGNOSIS — L84 Corns and callosities: Secondary | ICD-10-CM

## 2013-10-15 NOTE — Progress Notes (Signed)
Subjective:     Patient ID: Sherry [name], female   DOB: 1945-09-20, 68 y.o.   MRN: [id number]  HPI patient presents with lesion plantar aspect left foot   Review of Systems     Objective:   Physical Exam Neurovascular status intact with plantar keratotic lesion left    Assessment:     Callus formation left    Plan:     Debris painful lesion no bleeding noted

## 2013-10-18 ENCOUNTER — Ambulatory Visit (AMBULATORY_SURGERY_CENTER): Payer: Self-pay

## 2013-10-18 VITALS — Ht 65.5 in | Wt 153.0 lb

## 2013-10-18 DIAGNOSIS — K219 Gastro-esophageal reflux disease without esophagitis: Secondary | ICD-10-CM

## 2013-10-18 NOTE — Progress Notes (Signed)
No allergies to eggs or soy No home oxygen No past problem with anesthesia No diet/weight loss meds  Has email  Emmi instructions given for endoscopy

## 2013-10-21 ENCOUNTER — Encounter: Payer: Self-pay | Admitting: Internal Medicine

## 2013-10-26 ENCOUNTER — Ambulatory Visit: Payer: Medicare Other | Admitting: Internal Medicine

## 2013-10-26 ENCOUNTER — Other Ambulatory Visit: Payer: Self-pay | Admitting: Internal Medicine

## 2013-10-26 ENCOUNTER — Encounter: Payer: Self-pay | Admitting: Internal Medicine

## 2013-10-26 ENCOUNTER — Other Ambulatory Visit: Payer: Self-pay

## 2013-10-26 VITALS — BP 125/75 | HR 64 | Temp 97.7°F | Resp 14 | Ht 65.5 in | Wt 153.0 lb

## 2013-10-26 DIAGNOSIS — R1013 Epigastric pain: Secondary | ICD-10-CM

## 2013-10-26 DIAGNOSIS — K2100 Gastro-esophageal reflux disease with esophagitis, without bleeding: Secondary | ICD-10-CM

## 2013-10-26 DIAGNOSIS — K219 Gastro-esophageal reflux disease without esophagitis: Secondary | ICD-10-CM

## 2013-10-26 DIAGNOSIS — K449 Diaphragmatic hernia without obstruction or gangrene: Secondary | ICD-10-CM

## 2013-10-26 DIAGNOSIS — K319 Disease of stomach and duodenum, unspecified: Secondary | ICD-10-CM

## 2013-10-26 MED ORDER — METOCLOPRAMIDE HCL 5 MG PO TABS: 5.0000 mg | ORAL_TABLET | Freq: Every evening | ORAL | Status: DC | PRN

## 2013-10-26 MED ORDER — SODIUM CHLORIDE 0.9 % IV SOLN: 500.0000 mL | INTRAVENOUS | Status: DC

## 2013-10-26 MED ORDER — PANTOPRAZOLE SODIUM 40 MG PO TBEC: 40.0000 mg | DELAYED_RELEASE_TABLET | Freq: Two times a day (BID) | ORAL | Status: DC

## 2013-10-26 NOTE — Patient Instructions (Addendum)

## 2013-10-26 NOTE — Progress Notes (Signed)
A/ox3, pleased with MAC, report to RN 

## 2013-10-26 NOTE — Progress Notes (Signed)
Called to room to assist during endoscopic procedure.  Patient ID and intended procedure confirmed with present staff. Received instructions for my participation in the procedure from the performing physician.  

## 2013-10-26 NOTE — Op Note (Signed)
[place]  [address]. [place], [id number]   ENDOSCOPY PROCEDURE REPORT  PATIENT: Sherry, [name]  MR#: [id number] BIRTHDATE: 1945-11-22 , 70  yrs. old GENDER: Female ENDOSCOPIST: Lafayette Dragon, MD REFERRED BY:  Nehemiah Settle, M.D. PROCEDURE DATE:  10/26/2013 PROCEDURE:  EGD w/ biopsy ASA CLASS:     Class I INDICATIONS:  Dyspepsia.   Epigastric pain.   Heartburn.   history of GERD.   by upper endoscopy in November 2007 showed a 3 cm hiatal hernia.  Patient has been refractory to PPIs. MEDICATIONS: MAC sedation, administered by CRNA and propofol (Diprivan) 200mg  IV TOPICAL ANESTHETIC: none  DESCRIPTION OF PROCEDURE: After the risks benefits and alternatives of the procedure were thoroughly explained, informed consent was obtained.  The LB [id number] endoscope was introduced through the mouth and advanced to the second portion of the duodenum. Without limitations.  The instrument was slowly withdrawn as the mucosa was fully examined.      Esophagus: there was a spasm in the upper esophageal sphincter which relaxed with pressure. Mucosa of the proximal and mid esophagus was normal. Lumen was tortuous and eccentric. Distal esophageal lumen was angulated and there was a mild nonobstructing stricture at the level of 30 cm from the incisors. biopsies were taken from the squamocolumnar junction.Distal to the squamocolumnar junction was a large hiatal hernia which was not reducible and extended from 30-35 cm from the incisors Stomach: there was a 5 cm nonreducible hiatal hernia. This was confirmed by retroflexing the endoscope in the gastric antrum. Mucosa of the gastric antrum was mildly erythematous. Biopsies were obtained to rule out H. pylori. Pyloric outlet was normal Duodenum: duodenal bulb and descending duodenum was normal[ The scope was then withdrawn from the patient and the procedure completed.  COMPLICATIONS: There were no  complications. ENDOSCOPIC IMPRESSION:  mild nonobstructing esophageal stricture not dilated Tortuous esophagus consistent with presbyesophagus 5 cm nonreducible hiatal hernia larger than on previous endoscopy in 2007 Mild antral gastritis. Status post biopsies RECOMMENDATIONS: 1.  Anti-reflux regimen to be follow 2.  Await pathology results 3.  My office will arrange for you to have a Barium Esophagram performed.  This is a radiology test to examine your esophagus. 4.  increase PPIs to twice a day. Reglan 5 mg at bedtime Depending on the results of the barium esophagram and response to medical therapy or consider a Nissen fundoplication  REPEAT EXAM:  eSigned:  Lafayette Dragon, MD 10/26/2013 1:51 PM   CC:  PATIENT NAME:  Sherry, [name] MR#: [id number]

## 2013-10-27 ENCOUNTER — Telehealth: Payer: Self-pay | Admitting: *Deleted

## 2013-10-27 NOTE — Telephone Encounter (Signed)
  Follow up Call-  Call back number 10/26/2013  Post procedure Call Back phone  # [phone]  Permission to leave phone message Yes     Patient questions:  Do you have a fever, pain , or abdominal swelling? No. Pain Score  0 *  Have you tolerated food without any problems? Yes.    Have you been able to return to your normal activities? Yes.    Do you have any questions about your discharge instructions: Diet   No. Medications  No. Follow up visit  No.  Do you have questions or concerns about your Care? No.  Actions: * If pain score is 4 or above: No action needed, pain <4.

## 2013-10-31 ENCOUNTER — Ambulatory Visit (HOSPITAL_COMMUNITY): Payer: Medicare Other

## 2013-10-31 ENCOUNTER — Encounter: Payer: Self-pay | Admitting: Internal Medicine

## 2013-11-10 ENCOUNTER — Ambulatory Visit (HOSPITAL_COMMUNITY)
Admission: RE | Admit: 2013-11-10 | Discharge: 2013-11-10 | Disposition: A | Discharge status: Home / Self Care | Payer: Medicare Other | Source: Ambulatory Visit | Attending: Internal Medicine | Admitting: Internal Medicine

## 2013-11-10 DIAGNOSIS — K219 Gastro-esophageal reflux disease without esophagitis: Secondary | ICD-10-CM | POA: Diagnosis not present

## 2013-11-10 DIAGNOSIS — K449 Diaphragmatic hernia without obstruction or gangrene: Secondary | ICD-10-CM | POA: Diagnosis not present

## 2013-11-10 DIAGNOSIS — K3189 Other diseases of stomach and duodenum: Secondary | ICD-10-CM | POA: Diagnosis not present

## 2013-11-10 DIAGNOSIS — R1013 Epigastric pain: Secondary | ICD-10-CM | POA: Diagnosis not present

## 2013-11-10 DIAGNOSIS — M47812 Spondylosis without myelopathy or radiculopathy, cervical region: Secondary | ICD-10-CM | POA: Diagnosis not present

## 2013-12-01 ENCOUNTER — Telehealth: Payer: Self-pay | Admitting: Obstetrics and Gynecology

## 2013-12-01 NOTE — Telephone Encounter (Signed)
Pt confirmed appt

## 2013-12-01 NOTE — Telephone Encounter (Signed)
Confirming pts appt °

## 2013-12-08 ENCOUNTER — Ambulatory Visit (INDEPENDENT_AMBULATORY_CARE_PROVIDER_SITE_OTHER): Payer: Medicare Other | Admitting: Obstetrics and Gynecology

## 2013-12-08 ENCOUNTER — Encounter: Payer: Self-pay | Admitting: Obstetrics and Gynecology

## 2013-12-08 VITALS — BP 130/70 | HR 70 | Resp 14 | Ht 65.0 in | Wt 156.0 lb

## 2013-12-08 DIAGNOSIS — Z01419 Encounter for gynecological examination (general) (routine) without abnormal findings: Secondary | ICD-10-CM

## 2013-12-08 MED ORDER — ESTRADIOL-NORETHINDRONE ACET 0.5-0.1 MG PO TABS: 1.0000 | ORAL_TABLET | Freq: Every morning | ORAL | Status: DC

## 2013-12-08 NOTE — Progress Notes (Addendum)
Patient ID: Sherry [name], female   DOB: [birth year], 68 y.o.   MRN: [id number] GYNECOLOGY VISIT  PCP:   Nehemiah Settle, MD  Referring provider:   HPI: 68 y.o.   Married  Caucasian  female   [id number] with Patient's last menstrual period was 04/14/1997.   here for AEX.  Stopped HRT one month ago.  No real symptoms of hot flashes.  Having night sweats.  Hard to sleep and rest.  Taking Ambien to sleep again.  Feels a little "Blah."  Helping to care for grandchild. Tired.   Hgb:    PCP Urine:  PCP  GYNECOLOGIC HISTORY: Patient's last menstrual period was 04/14/1997. Sexually active:  yes Partner preference: yes Contraception:   postmenopausal Menopausal hormone therapy: none DES exposure: no   Blood transfusions: no  Sexually transmitted diseases: no   GYN procedures and prior surgeries:  Breast reduction, left  breast  Biopsy x2--benign, "bladder surgery" which was unsuccessful, conization of cervix. Last mammogram:  08-05-13 extremely dense breasts, otherwise normal:[facility]               Last pap and high risk HPV testing:  11-25-11 wnl, negative HR HPV.  History of abnormal pap smear:  Yes, in her 20's patient had a conization of cervix.  Paps normal since.   OB History   Grav Para Term Preterm Abortions TAB SAB Ect Mult Living   3 2   1  1   3        LIFESTYLE: Exercise:   Walking and golf            OTHER HEALTH MAINTENANCE: Tetanus/TDap:   2010 HPV:                   n/a Influenza:            01/2013   Bone density:      2014 normal with Dr. Maxwell Caul Colonoscopy:      5-6 years ago with Dr. Chelsea Aus.  Next colonoscopy due in 2017 per patient.  Cholesterol check: normal through PCP  Family History  Problem Relation Age of Onset  . Anesthesia problems Neg Hx   . Hypotension Neg Hx   . Malignant hyperthermia Neg Hx   . Pseudochol deficiency Neg Hx   . Kidney disease Father   . Rheum arthritis Father   . Colon cancer Maternal Grandfather      questionable  . Parkinson's disease Mother   . Leukemia Sister   . Breast cancer Paternal Aunt     Patient Active Problem List   Diagnosis Date Noted  . Flank pain 10/03/2011  . Abdominal pain 09/30/2011  . 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 BACK PAIN, LUMBAR 05/07/2010  . Abdominal pain, unspecified site 06/08/2009  . RLQ PAIN 03/30/2009  . ABNORMAL FINDINGS GI TRACT 03/30/2009  . ANXIETY 03/22/2009  . DEPRESSION 03/22/2009  . GERD 03/22/2009  . CONSTIPATION 03/22/2009  . FLATULENCE-GAS-BLOATING 03/22/2009  . PERSONAL HX COLONIC POLYPS 03/22/2009   Past Medical History  Diagnosis Date  . TMJ (dislocation of temporomandibular joint)   . Bronchitis   . Carpal tunnel syndrome     bilateral  . Chronic back pain     herniated disc,stenosis,radiculopathy,spondylolisthesis  . Chronic neck pain   . Eczema   . Hiatal hernia   . Constipation, chronic   . Hemorrhoids   . IBS (irritable bowel syndrome)     lactose intolerant  . Anxiety  valium prn  . Depression   . History of bronchitis     "I've had it a number of times"  . GERD (gastroesophageal reflux disease)   . Arthritis     "little in my fingers; shoulders"  . Enteritis 09/30/11    "localized into one section of small bowel"  . Hiatal hernia   . Osteoarthritis     Past Surgical History  Procedure Laterality Date  . Tonsillectomy      1951  . Appendectomy      1967  . Knee arthroscopy  1994    "don't remember which side"  . Incision and drainage abscess / hematoma of bursa / knee / thigh  1994    "day after knee scope"  . Back surgery    . Posterior fusion lumbar spine  02/2011    L3-L5  . Dilation and curettage of uterus  1988  . Breast reduction surgery  1988  . Breast biopsy  1980's    "twice on the left"  . Bladder suspension  1980's    "wasn't successful"  . Cervix lesion destruction      in her 20's    ALLERGIES: Adhesive  Current Outpatient Prescriptions   Medication Sig Dispense Refill  . B Complex Vitamins (VITAMIN B COMPLEX PO) Take 1 tablet by mouth daily.      . Cholecalciferol (VITAMIN D) 2000 UNITS CAPS Take 1 capsule by mouth daily.      . fexofenadine (ALLEGRA) 180 MG tablet Take 180 mg by mouth at bedtime.      . hydrocortisone 2.5 % cream       . MAGNESIUM CARBONATE PO Take 1 tablet by mouth daily.      . metroNIDAZOLE (METROGEL) 1 % gel       . naproxen sodium (ANAPROX) 220 MG tablet Take 220 mg by mouth as needed.      . Omega-3 Fatty Acids (FISH OIL PO) Take 1 tablet by mouth 2 (two) times daily.      . Probiotic Product (PROBIOTIC DAILY PO) Take 1 tablet by mouth daily.      Marland Kitchen triamcinolone (NASACORT AQ) 55 MCG/ACT nasal inhaler Place 1 spray into the nose every morning.      . TURMERIC PO Take by mouth. Take 2 tablets in the morning and 1 at night      . zolpidem (AMBIEN) 10 MG tablet Take 2.5 mg by mouth at bedtime as needed for sleep.       Marland Kitchen dicyclomine (BENTYL) 10 MG capsule       . Estradiol-Norethindrone Acet 0.5-0.1 MG per tablet Take 1 tablet by mouth every morning.  28 tablet  12  . pantoprazole (PROTONIX) 40 MG tablet Take 1 tablet (40 mg total) by mouth 2 (two) times daily.  60 tablet  6   No current facility-administered medications for this visit.     ROS:  Pertinent items are noted in HPI.  History   Social History  . Marital Status: Married    Spouse Name: N/A    Number of Children: N/A  . Years of Education: N/A   Occupational History  . Not on file.   Social History Main Topics  . Smoking status: Former Smoker -- 4 years    Types: Cigarettes    Quit date: 04/14/1968  . Smokeless tobacco: Never Used     Comment: "just a social smoker for ~ 4 yrs"  . Alcohol Use: 3.0 oz/week    5  Glasses of wine per week     Comment: 4-5 drinks a week (wine or beer)  . Drug Use: No  . Sexual Activity: Yes    Partners: Male    Birth Control/ Protection: Post-menopausal   Other Topics Concern  . Not on file    Social History Narrative  . No narrative on file    PHYSICAL EXAMINATION:    BP 130/70  Pulse 70  Resp 14  Ht 5\' 5"  (1.651 m)  Wt 156 lb (70.761 kg)  BMI 25.96 kg/m2  LMP 04/14/1997   Wt Readings from Last 3 Encounters:  12/08/13 156 lb (70.761 kg)  10/26/13 153 lb (69.4 kg)  10/18/13 153 lb (69.4 kg)     Ht Readings from Last 3 Encounters:  12/08/13 5\' 5"  (1.651 m)  10/26/13 5' 5.5" (1.664 m)  10/18/13 5' 5.5" (1.664 m)    General appearance: alert, cooperative and appears stated age Head: Normocephalic, without obvious abnormality, atraumatic Neck: no adenopathy, supple, symmetrical, trachea midline and thyroid not enlarged, symmetric, no tenderness/mass/nodules Lungs: clear to auscultation bilaterally Breasts: Inspection consistent with bilateral breast reduction procedure, No nipple retraction or dimpling, No nipple discharge or bleeding, No axillary or supraclavicular adenopathy, Normal to palpation without dominant masses Heart: regular rate and rhythm Abdomen: soft, non-tender; no masses,  no organomegaly Extremities: extremities normal, atraumatic, no cyanosis or edema Skin: Skin color, texture, turgor normal. No rashes or lesions Lymph nodes: Cervical, supraclavicular, and axillary nodes normal. No abnormal inguinal nodes palpated Neurologic: Grossly normal  Pelvic: External genitalia:  no lesions              Urethra:  normal appearing urethra with no masses, tenderness or lesions              Bartholins and Skenes: normal                 Vagina: normal appearing vagina with normal color and discharge, no lesions              Cervix: normal appearance              Pap only done: Yes.          Bimanual Exam:  Uterus:  uterus is normal size, shape, consistency and nontender                                      Adnexa: normal adnexa in size, nontender and no masses                                      Rectovaginal:  Yes.                                         Confirms above.                                      Anus:  normal sphincter tone, no lesions  ASSESSMENT  Normal gynecologic exam. Recent discontinuation of HRT.  Sleep disturbance.  Remote history of cervical conization for dysplasia.   PLAN  Mammogram recommended yearly starting at age 89. Pap smear and high  risk HPV testing as above. Counseled on self breast exam, Calcium and vitamin D intake, exercise. See lab orders: No. Will restart Activella.  See Orders.  Reviewed risks of MI, stroke, DVT, PE, breast cancer. Return annually or prn   An After Visit Summary was printed and given to the patient.

## 2013-12-08 NOTE — Patient Instructions (Signed)

## 2013-12-12 LAB — IPS PAP SMEAR ONLY

## 2014-02-13 ENCOUNTER — Encounter: Payer: Self-pay | Admitting: Obstetrics and Gynecology

## 2014-06-28 ENCOUNTER — Ambulatory Visit (INDEPENDENT_AMBULATORY_CARE_PROVIDER_SITE_OTHER): Payer: Medicare Other | Admitting: Podiatry

## 2014-06-28 ENCOUNTER — Encounter: Payer: Self-pay | Admitting: Podiatry

## 2014-06-28 VITALS — BP 103/83 | HR 72 | Resp 18

## 2014-06-28 DIAGNOSIS — Q828 Other specified congenital malformations of skin: Secondary | ICD-10-CM | POA: Diagnosis not present

## 2014-06-29 ENCOUNTER — Telehealth: Payer: Self-pay

## 2014-06-29 ENCOUNTER — Other Ambulatory Visit: Payer: Self-pay | Admitting: Obstetrics and Gynecology

## 2014-06-29 DIAGNOSIS — Z1231 Encounter for screening mammogram for malignant neoplasm of breast: Secondary | ICD-10-CM

## 2014-06-29 NOTE — Telephone Encounter (Signed)
Spoke with patient. Advised patient of approval for Lopreeza prior authorization from 06/27/2014-04/04/2015. Patient is agreeable. Approval to [place] for signature and scan.  Routing to provider for final review. Patient agreeable to disposition. Will close encounter

## 2014-06-29 NOTE — Progress Notes (Signed)
Patient ID: Sherry [name], female   DOB: 06/05/1945, 69 y.o.   MRN: [id number]  Subjective: 69 year old phenol presents the office today with complaints of the corn on the bottom of her left foot which is painful particularly with pressure. She denies any redness or drainage on the site. She said no prior treatment for this area. No other complaints at this time. No acute changes since last appointment.  Objective: AAO x3, NAD DP/PT pulses palpable bilaterally, CRT less than 3 seconds Protective sensation intact with Simms Weinstein monofilament, vibratory sensation intact, Achilles tendon reflex intact Annular, hyperkeratotic lesion left foot submetatarsal 4. There is tenderness palpation directly on this lesion. Upon debridement the lesion there is no underlying ulceration, drainage, or other clinical signs of infection. No other areas of tenderness to bilateral lower extremities. MMT 5/5, ROM WNL.  No open lesions bilaterally No overlying edema, erythema, increase in warmth to bilateral lower extremities.  No pain with calf compression, swelling, warmth, erythema bilaterally.   Assessment: 69 year old female left sub-metatarsal 4 porokeratosis  Plan: -Treatment options discussed including alternatives, risks, complications. -Lesion sharply debrided without complications. After debridement a pad was placed around the area and a small salinocaine and was applied followed by a bandage. Post procedure instructions were discussed the patient. Monitoring signs or symptoms of infection and directed to call the office immediately if any occur go to the ER. -Follow-up as needed. In the meantime occurs call the office with any questions, concerns, change in symptoms.

## 2014-08-28 ENCOUNTER — Ambulatory Visit (HOSPITAL_COMMUNITY)
Admission: RE | Admit: 2014-08-28 | Discharge: 2014-08-28 | Disposition: A | Discharge status: Home / Self Care | Payer: Medicare Other | Source: Ambulatory Visit | Attending: Obstetrics and Gynecology | Admitting: Obstetrics and Gynecology

## 2014-08-28 DIAGNOSIS — Z1231 Encounter for screening mammogram for malignant neoplasm of breast: Secondary | ICD-10-CM | POA: Diagnosis not present

## 2014-09-12 ENCOUNTER — Ambulatory Visit (INDEPENDENT_AMBULATORY_CARE_PROVIDER_SITE_OTHER): Payer: Medicare Other | Admitting: Podiatry

## 2014-09-12 ENCOUNTER — Encounter: Payer: Self-pay | Admitting: Podiatry

## 2014-09-12 VITALS — BP 128/78 | HR 70 | Resp 12

## 2014-09-12 DIAGNOSIS — L84 Corns and callosities: Secondary | ICD-10-CM | POA: Diagnosis not present

## 2014-09-12 NOTE — Progress Notes (Signed)
Subjective:     Patient ID: Sherry [name], female   DOB: Aug 22, 1945, 69 y.o.   MRN: [id number]  HPI patient presents with lesions on the plantar aspect left foot   Review of Systems     Objective:   Physical Exam Neurovascular status intact with plantar lesion left that are painful when pressed    Assessment:     Keratotic lesions 2 plantar left    Plan:     Debride lesions with no iatrogenic bleeding noted

## 2014-10-03 ENCOUNTER — Emergency Department (HOSPITAL_COMMUNITY)
Admission: EM | Admit: 2014-10-03 | Discharge: 2014-10-03 | Disposition: A | Discharge status: Home / Self Care | Payer: Medicare Other

## 2014-10-03 NOTE — ED Notes (Signed)
Called for patient at triage x3. With no answer

## 2014-10-03 NOTE — ED Notes (Signed)
Called for patient at triage x3. No answer

## 2014-10-05 ENCOUNTER — Other Ambulatory Visit: Payer: Self-pay | Admitting: Nurse Practitioner

## 2014-10-05 ENCOUNTER — Ambulatory Visit
Admission: RE | Admit: 2014-10-05 | Discharge: 2014-10-05 | Disposition: A | Discharge status: Home / Self Care | Payer: Medicare Other | Source: Ambulatory Visit | Attending: Nurse Practitioner | Admitting: Nurse Practitioner

## 2014-10-05 DIAGNOSIS — R109 Unspecified abdominal pain: Secondary | ICD-10-CM

## 2014-10-06 ENCOUNTER — Ambulatory Visit
Admission: RE | Admit: 2014-10-06 | Discharge: 2014-10-06 | Disposition: A | Discharge status: Home / Self Care | Payer: Medicare Other | Source: Ambulatory Visit | Attending: Internal Medicine | Admitting: Internal Medicine

## 2014-10-06 ENCOUNTER — Other Ambulatory Visit: Payer: Self-pay | Admitting: Internal Medicine

## 2014-10-06 DIAGNOSIS — R1084 Generalized abdominal pain: Secondary | ICD-10-CM

## 2014-10-08 ENCOUNTER — Encounter (HOSPITAL_COMMUNITY): Payer: Self-pay | Admitting: Family Medicine

## 2014-10-08 ENCOUNTER — Emergency Department (HOSPITAL_COMMUNITY)
Admission: EM | Admit: 2014-10-08 | Discharge: 2014-10-09 | Disposition: A | Discharge status: Home / Self Care | Payer: Medicare Other | Attending: Emergency Medicine | Admitting: Emergency Medicine

## 2014-10-08 DIAGNOSIS — Z9049 Acquired absence of other specified parts of digestive tract: Secondary | ICD-10-CM | POA: Insufficient documentation

## 2014-10-08 DIAGNOSIS — K219 Gastro-esophageal reflux disease without esophagitis: Secondary | ICD-10-CM | POA: Insufficient documentation

## 2014-10-08 DIAGNOSIS — Z79899 Other long term (current) drug therapy: Secondary | ICD-10-CM | POA: Diagnosis not present

## 2014-10-08 DIAGNOSIS — G8929 Other chronic pain: Secondary | ICD-10-CM | POA: Diagnosis not present

## 2014-10-08 DIAGNOSIS — Z7951 Long term (current) use of inhaled steroids: Secondary | ICD-10-CM | POA: Insufficient documentation

## 2014-10-08 DIAGNOSIS — K589 Irritable bowel syndrome without diarrhea: Secondary | ICD-10-CM | POA: Diagnosis not present

## 2014-10-08 DIAGNOSIS — Z793 Long term (current) use of hormonal contraceptives: Secondary | ICD-10-CM | POA: Diagnosis not present

## 2014-10-08 DIAGNOSIS — F419 Anxiety disorder, unspecified: Secondary | ICD-10-CM | POA: Diagnosis not present

## 2014-10-08 DIAGNOSIS — Z8709 Personal history of other diseases of the respiratory system: Secondary | ICD-10-CM | POA: Insufficient documentation

## 2014-10-08 DIAGNOSIS — R1084 Generalized abdominal pain: Secondary | ICD-10-CM | POA: Diagnosis present

## 2014-10-08 DIAGNOSIS — F329 Major depressive disorder, single episode, unspecified: Secondary | ICD-10-CM | POA: Diagnosis not present

## 2014-10-08 DIAGNOSIS — Z872 Personal history of diseases of the skin and subcutaneous tissue: Secondary | ICD-10-CM | POA: Diagnosis not present

## 2014-10-08 DIAGNOSIS — M1990 Unspecified osteoarthritis, unspecified site: Secondary | ICD-10-CM | POA: Diagnosis not present

## 2014-10-08 DIAGNOSIS — K5900 Constipation, unspecified: Secondary | ICD-10-CM | POA: Diagnosis not present

## 2014-10-08 DIAGNOSIS — Z87891 Personal history of nicotine dependence: Secondary | ICD-10-CM | POA: Diagnosis not present

## 2014-10-08 NOTE — ED Notes (Signed)
Patient states she has generalized abd pain that started last Monday. Denies nausea, vomiting, and diarrhea. Pt believes she could be constipated. Last known bowel movement was Thursday. Pt has been using OTC laxatives, Magnesium Citrate, and Fleets edema to help with minimal results. Pt feels bloated and constant pain.

## 2014-10-09 ENCOUNTER — Encounter (HOSPITAL_COMMUNITY): Payer: Self-pay

## 2014-10-09 ENCOUNTER — Emergency Department (HOSPITAL_COMMUNITY): Payer: Medicare Other

## 2014-10-09 LAB — COMPREHENSIVE METABOLIC PANEL
ALT: 22 U/L (ref 14–54)
AST: 29 U/L (ref 15–41)
Albumin: 4.5 g/dL (ref 3.5–5.0)
Alkaline Phosphatase: 71 U/L (ref 38–126)
Anion gap: 9 (ref 5–15)
BUN: 14 mg/dL (ref 6–20)
CO2: 27 mmol/L (ref 22–32)
Calcium: 9.3 mg/dL (ref 8.9–10.3)
Chloride: 102 mmol/L (ref 101–111)
Creatinine, Ser: 0.77 mg/dL (ref 0.44–1.00)
GFR calc Af Amer: >60 mL/min (ref >60)
GFR calc non Af Amer: >60 mL/min (ref >60)
Glucose, Bld: 96 mg/dL (ref 65–99)
Potassium: 3.8 mmol/L (ref 3.5–5.1)
Sodium: 138 mmol/L (ref 135–145)
Total Bilirubin: 0.5 mg/dL (ref 0.3–1.2)
Total Protein: 7.3 g/dL (ref 6.5–8.1)

## 2014-10-09 LAB — CBC WITH DIFFERENTIAL/PLATELET
Basophils Absolute: 0 10*3/uL (ref 0.0–0.1)
Basophils Relative: 1 % (ref 0–1)
Eosinophils Absolute: 0.3 10*3/uL (ref 0.0–0.7)
Eosinophils Relative: 4 % (ref 0–5)
HCT: 47.9 % — ABNORMAL HIGH (ref 36.0–46.0)
Hemoglobin: 16.1 g/dL — ABNORMAL HIGH (ref 12.0–15.0)
Lymphocytes Relative: 44 % (ref 12–46)
Lymphs Abs: 3 10*3/uL (ref 0.7–4.0)
MCH: 32.2 pg (ref 26.0–34.0)
MCHC: 33.6 g/dL (ref 30.0–36.0)
MCV: 95.8 fL (ref 78.0–100.0)
Monocytes Absolute: 0.7 10*3/uL (ref 0.1–1.0)
Monocytes Relative: 11 % (ref 3–12)
Neutro Abs: 2.6 10*3/uL (ref 1.7–7.7)
Neutrophils Relative %: 40 % — ABNORMAL LOW (ref 43–77)
Platelets: 287 10*3/uL (ref 150–400)
RBC: 5 MIL/uL (ref 3.87–5.11)
RDW: 12.5 % (ref 11.5–15.5)
WBC: 6.6 10*3/uL (ref 4.0–10.5)

## 2014-10-09 LAB — LIPASE, BLOOD: Lipase: 29 U/L (ref 22–51)

## 2014-10-09 LAB — URINALYSIS, ROUTINE W REFLEX MICROSCOPIC
Bilirubin Urine: NEGATIVE
Glucose, UA: NEGATIVE mg/dL
Hgb urine dipstick: NEGATIVE
Ketones, ur: NEGATIVE mg/dL
Nitrite: NEGATIVE
Protein, ur: NEGATIVE mg/dL
Specific Gravity, Urine: 1.013 (ref 1.005–1.030)
Urobilinogen, UA: 0.2 mg/dL (ref 0.0–1.0)
pH: 5.5 (ref 5.0–8.0)

## 2014-10-09 LAB — URINE MICROSCOPIC-ADD ON

## 2014-10-09 MED ORDER — IOHEXOL 300 MG/ML  SOLN
50.0000 mL | Freq: Once | INTRAMUSCULAR | Status: AC | PRN
  Administered 2014-10-09: 50 mL via ORAL

## 2014-10-09 MED ORDER — IOHEXOL 300 MG/ML  SOLN
100.0000 mL | Freq: Once | INTRAMUSCULAR | Status: AC | PRN
  Administered 2014-10-09: 100 mL via INTRAVENOUS

## 2014-10-09 MED ORDER — POLYETHYLENE GLYCOL 3350 17 G PO PACK: 17.0000 g | PACK | Freq: Three times a day (TID) | ORAL | Status: AC

## 2014-10-09 NOTE — ED Notes (Signed)
PT transported to CT>

## 2014-10-09 NOTE — ED Notes (Signed)
Called lab to check the status of CMP an dit should be crossing over now.

## 2014-10-09 NOTE — ED Notes (Signed)
Pt ambulated to restroom with steady gait.

## 2014-10-09 NOTE — ED Notes (Signed)
Pt updated that she is awaiting CT. She asking how long it will be before she has her CT and can leave because she has to babysit at 0830.

## 2014-10-09 NOTE — ED Notes (Signed)
Lori from CT bringing oral CT contrast to drink

## 2014-10-09 NOTE — Discharge Instructions (Signed)

## 2014-10-09 NOTE — ED Provider Notes (Signed)
CSN: [id number]     Arrival date & time 10/08/14  2329 History   First MD Initiated Contact with Patient 10/09/14 0128     Chief Complaint  Patient presents with  . Abdominal Pain     (Consider location/radiation/quality/duration/timing/severity/associated sxs/prior Treatment) Patient is a 69 y.o. female presenting with abdominal pain. The history is provided by the patient. No language interpreter was used.  Abdominal Pain Pain location:  Generalized Pain severity:  Moderate Onset quality:  Gradual Duration:  1 week Timing:  Constant Progression:  Worsening Associated symptoms: constipation   Associated symptoms: no chills, no fever, no nausea and no vomiting   Associated symptoms comment:  Patient with a history of IBS has been having abdominal pain and constipation for one week despite use of laxatives and enemas. No fever, N, V. She continues to pass gas and have increased belching. No chest pain, SOB, cough, dysuria.   Past Medical History  Diagnosis Date  . TMJ (dislocation of temporomandibular joint)   . Bronchitis   . Carpal tunnel syndrome     bilateral  . Chronic back pain     herniated disc,stenosis,radiculopathy,spondylolisthesis  . Chronic neck pain   . Eczema   . Hiatal hernia   . Constipation, chronic   . Hemorrhoids   . IBS (irritable bowel syndrome)     lactose intolerant  . Anxiety     valium prn  . Depression   . History of bronchitis     "I've had it a number of times"  . GERD (gastroesophageal reflux disease)   . Arthritis     "little in my fingers; shoulders"  . Enteritis 09/30/11    "localized into one section of small bowel"  . Hiatal hernia   . Osteoarthritis    Past Surgical History  Procedure Laterality Date  . Tonsillectomy      1951  . Appendectomy      1967  . Knee arthroscopy  1994    "don't remember which side"  . Incision and drainage abscess / hematoma of bursa / knee / thigh  1994    "day after knee scope"  . Back surgery     . Posterior fusion lumbar spine  02/2011    L3-L5  . Dilation and curettage of uterus  1988  . Breast reduction surgery  1988  . Breast biopsy  1980's    "twice on the left"  . Bladder suspension  1980's    "wasn't successful"  . Cervix lesion destruction      in her 20's   Family History  Problem Relation Age of Onset  . Anesthesia problems Neg Hx   . Hypotension Neg Hx   . Malignant hyperthermia Neg Hx   . Pseudochol deficiency Neg Hx   . Kidney disease Father   . Rheum arthritis Father   . Colon cancer Maternal Grandfather     questionable  . Parkinson's disease Mother   . Leukemia Sister   . Breast cancer Paternal Aunt    History  Substance Use Topics  . Smoking status: Former Smoker -- 4 years    Types: Cigarettes    Quit date: 04/14/1968  . Smokeless tobacco: Never Used     Comment: "just a social smoker for ~ 4 yrs"  . Alcohol Use: 3.0 oz/week    5 Glasses of wine per week     Comment: 4-5 drinks a week (wine or beer)   OB History    Gravida Para Term  Preterm AB TAB SAB Ectopic Multiple Living   3 2   1  1   3      Review of Systems  Constitutional: Negative for fever and chills.  Respiratory: Negative.   Cardiovascular: Negative.   Gastrointestinal: Positive for abdominal pain and constipation. Negative for nausea, vomiting and blood in stool.  Genitourinary: Negative.   Musculoskeletal: Negative.   Skin: Negative.   Neurological: Negative.       Allergies  Adhesive  Home Medications   Prior to Admission medications   Medication Sig Start Date End Date Taking? Authorizing Provider  azithromycin (ZITHROMAX) 250 MG tablet  05/18/14   Historical Provider, MD  B Complex Vitamins (VITAMIN B COMPLEX PO) Take 1 tablet by mouth daily.    Historical Provider, MD  benzonatate (TESSALON) 100 MG capsule  06/05/14   Historical Provider, MD  chlorpheniramine-HYDROcodone ([place]) 10-8 MG/5ML [facility]  06/06/14   Historical Provider, MD  Cholecalciferol (VITAMIN  D) 2000 UNITS CAPS Take 1 capsule by mouth daily.    Historical Provider, MD  dicyclomine (BENTYL) 10 MG capsule  11/18/13   Historical Provider, MD  Estradiol-Norethindrone Acet 0.5-0.1 MG per tablet Take 1 tablet by mouth every morning. 12/08/13   Nunzio Cobbs, MD  fexofenadine (ALLEGRA) 180 MG tablet Take 180 mg by mouth at bedtime.    Historical Provider, MD  hydrocortisone 2.5 % cream  08/12/13   Historical Provider, MD  LOTEMAX 0.5 % GEL  03/22/14   Historical Provider, MD  MAGNESIUM CARBONATE PO Take 1 tablet by mouth daily.    Historical Provider, MD  metroNIDAZOLE (METROGEL) 1 % gel  11/16/12   Historical Provider, MD  naproxen sodium (ANAPROX) 220 MG tablet Take 220 mg by mouth as needed.    Historical Provider, MD  Omega-3 Fatty Acids (FISH OIL PO) Take 1 tablet by mouth 2 (two) times daily.    Historical Provider, MD  pantoprazole (PROTONIX) 40 MG tablet Take 1 tablet (40 mg total) by mouth 2 (two) times daily. 10/26/13   Lafayette Dragon, MD  predniSONE (STERAPRED UNI-PAK) 10 MG tablet  06/05/14   Historical Provider, MD  Probiotic Product (PROBIOTIC DAILY PO) Take 1 tablet by mouth daily.    Historical Provider, MD  triamcinolone (NASACORT AQ) 55 MCG/ACT nasal inhaler Place 1 spray into the nose every morning.    Historical Provider, MD  TURMERIC PO Take by mouth. Take 2 tablets in the morning and 1 at night    Historical Provider, MD  zolpidem (AMBIEN) 10 MG tablet Take 2.5 mg by mouth at bedtime as needed for sleep.  07/15/13   Historical Provider, MD   BP 155/89 mmHg  Pulse 65  Temp(Src)   Resp 20  Ht 5\' 5"  (1.651 m)  Wt 152 lb (68.947 kg)  BMI 25.29 kg/m2  SpO2 99%  LMP 04/14/1997 Physical Exam  Constitutional: She is oriented to person, place, and time. She appears well-developed and well-nourished.  HENT:  Head: Normocephalic.  Neck: Normal range of motion. Neck supple.  Cardiovascular: Normal rate and regular rhythm.   Pulmonary/Chest: Effort normal and breath sounds  normal.  Abdominal: Soft. Bowel sounds are normal. There is tenderness. There is no rebound and no guarding.  Mild generalized tenderness to soft abdomen.   Musculoskeletal: Normal range of motion.  Neurological: She is alert and oriented to person, place, and time.  Skin: Skin is warm and dry. No rash noted.  Psychiatric: She has a normal mood and affect.  ED Course  Procedures (including critical care time) Labs Review Labs Reviewed  CBC WITH DIFFERENTIAL/PLATELET  COMPREHENSIVE METABOLIC PANEL  LIPASE, BLOOD  URINALYSIS, ROUTINE W REFLEX MICROSCOPIC (NOT AT [facility])   Results for orders placed or performed during the hospital encounter of 10/08/14  CBC with Differential  Result Value Ref Range   WBC 6.6 4.0 - 10.5 K/uL   RBC 5.00 3.87 - 5.11 MIL/uL   Hemoglobin 16.1 (H) 12.0 - 15.0 g/dL   HCT 47.9 (H) 36.0 - 46.0 %   MCV 95.8 78.0 - 100.0 fL   MCH 32.2 26.0 - 34.0 pg   MCHC 33.6 30.0 - 36.0 g/dL   RDW 12.5 11.5 - 15.5 %   Platelets 287 150 - 400 K/uL   Neutrophils Relative % 40 (L) 43 - 77 %   Neutro Abs 2.6 1.7 - 7.7 K/uL   Lymphocytes Relative 44 12 - 46 %   Lymphs Abs 3.0 0.7 - 4.0 K/uL   Monocytes Relative 11 3 - 12 %   Monocytes Absolute 0.7 0.1 - 1.0 K/uL   Eosinophils Relative 4 0 - 5 %   Eosinophils Absolute 0.3 0.0 - 0.7 K/uL   Basophils Relative 1 0 - 1 %   Basophils Absolute 0.0 0.0 - 0.1 K/uL  Comprehensive metabolic panel  Result Value Ref Range   Sodium 138 135 - 145 mmol/L   Potassium 3.8 3.5 - 5.1 mmol/L   Chloride 102 101 - 111 mmol/L   CO2 27 22 - 32 mmol/L   Glucose, Bld 96 65 - 99 mg/dL   BUN 14 6 - 20 mg/dL   Creatinine, Ser 0.77 0.44 - 1.00 mg/dL   Calcium 9.3 8.9 - 10.3 mg/dL   Total Protein 7.3 6.5 - 8.1 g/dL   Albumin 4.5 3.5 - 5.0 g/dL   AST 29 15 - 41 U/L   ALT 22 14 - 54 U/L   Alkaline Phosphatase 71 38 - 126 U/L   Total Bilirubin 0.5 0.3 - 1.2 mg/dL   GFR calc non Af Amer >60 >60 mL/min   GFR calc Af Amer >60 >60 mL/min   Anion  gap 9 5 - 15  Lipase, blood  Result Value Ref Range   Lipase 29 22 - 51 U/L  Urinalysis, Routine w reflex microscopic (not at [facility])  Result Value Ref Range   Color, Urine YELLOW YELLOW   APPearance CLOUDY (A) CLEAR   Specific Gravity, Urine 1.013 1.005 - 1.030   pH 5.5 5.0 - 8.0   Glucose, UA NEGATIVE NEGATIVE mg/dL   Hgb urine dipstick NEGATIVE NEGATIVE   Bilirubin Urine NEGATIVE NEGATIVE   Ketones, ur NEGATIVE NEGATIVE mg/dL   Protein, ur NEGATIVE NEGATIVE mg/dL   Urobilinogen, UA 0.2 0.0 - 1.0 mg/dL   Nitrite NEGATIVE NEGATIVE   Leukocytes, UA SMALL (A) NEGATIVE  Urine microscopic-add on  Result Value Ref Range   Squamous Epithelial / LPF FEW (A) RARE   WBC, UA 7-10 <3 WBC/hpf   Dg Abd 1 View  10/06/2014   CLINICAL DATA:  Abdominal pain.  EXAM: ABDOMEN - 1 VIEW  COMPARISON:  None.  FINDINGS: Soft tissue structures are unremarkable. No bowel distention or free air. Pelvic calcifications consistent with phleboliths. Prior lower lumbar fusion and laminectomy. No acute bony abnormality.  IMPRESSION: No acute abnormality.  No bowel distention.   Electronically Signed   By: Marcello Moores  Register   On: 10/06/2014 13:21   US Abdomen Complete  10/05/2014   CLINICAL  DATA:  Abdominal pain for 4 days  EXAM: ULTRASOUND ABDOMEN COMPLETE  COMPARISON:  None.  FINDINGS: Gallbladder: No gallstones or wall thickening visualized. No sonographic Murphy sign noted.  Common bile duct: Diameter: 4 mm  Liver: No focal lesion identified. Within normal limits in parenchymal echogenicity.  IVC: No abnormality visualized.  Pancreas: Visualized portion unremarkable.  Spleen: Size and appearance within normal limits.  Right Kidney: Length: 10.6 cm. Echogenicity within normal limits. No mass or hydronephrosis visualized.  Left Kidney: Length: 11.5 cm. Echogenicity within normal limits. No mass or hydronephrosis visualized.  Abdominal aorta: No aneurysm visualized.  Other findings: None.  IMPRESSION: Normal abdomen  ultrasound examination   Electronically Signed   By: Skipper Cliche M.D.   On: 10/05/2014 16:15   Ct Abdomen Pelvis W Contrast  10/09/2014   CLINICAL DATA:  Generalized abdominal pain, onset 1 week ago.  EXAM: CT ABDOMEN AND PELVIS WITH CONTRAST  TECHNIQUE: Multidetector CT imaging of the abdomen and pelvis was performed using the standard protocol following bolus administration of intravenous contrast.  CONTRAST:  129mL OMNIPAQUE IOHEXOL 300 MG/ML  SOLN  COMPARISON:  None.  FINDINGS: There are multiple low-attenuation foci in the liver which likely represent benign cysts measuring less than 1 cm. These are unchanged from 04/03/2009. No significant focal liver lesion is evident. The gallbladder and bile ducts appear unremarkable. The spleen, pancreas, adrenals and kidneys appear unremarkable.  There is a large hiatal hernia. The bowel is otherwise unremarkable.  The abdominal aorta is normal in caliber with mild atherosclerotic calcification.  The uterus and ovaries are normal.  There is posterior lumbar decompression with instrumented fusion and interbody cage devices from L4 through L5. No significant musculoskeletal lesion is evident.  Small right-sided nodules are visible at the uppermost image of this study, unchanged from the chest CT of 02/28/2009. No significant abnormality is evident in the lower chest.  IMPRESSION: 1. Large hiatal hernia. 2. No acute findings are evident in the abdomen or pelvis.   Electronically Signed   By: Andreas Newport M.D.   On: 10/09/2014 05:20    Imaging Review No results found.   EKG Interpretation None      MDM   Final diagnoses:  None    1. Abdominal pain  Labs and CT scan abdomen are inconclusive. She is comfortable in appearance and has not required pain medication in ED. No vomiting. She reports very little bowel movements in the last week. Will treat for constipation with Miralax and refer to GI for further evaluation. Return precautions  discussed.    Charlann Lange, PA-C 10/09/14 [place], MD 10/10/14 [phone]

## 2015-01-01 ENCOUNTER — Other Ambulatory Visit: Payer: Self-pay | Admitting: Obstetrics and Gynecology

## 2015-01-02 NOTE — Telephone Encounter (Signed)
Medication refill request: Lopreeza  Last AEX:  12/08/13 with BS Next AEX: 03/14/15 with BS Last MMG (if hormonal medication request): 08/28/14 3D dense category c, bi-rads c 2 benign Refill authorized: please advise

## 2015-01-04 ENCOUNTER — Ambulatory Visit: Payer: Medicare Other | Admitting: Obstetrics and Gynecology

## 2015-03-04 ENCOUNTER — Other Ambulatory Visit: Payer: Self-pay | Admitting: Obstetrics and Gynecology

## 2015-03-05 NOTE — Telephone Encounter (Signed)
Medication refill request: Lopreeza Last AEX:  12/08/2013 BS Next AEX: 03/04/2015 BS Last MMG (if hormonal medication request): Screening Breast Tomo Bilateral: BI-RADS Category 1, Benign Refill authorized: 01/02/2015. #28 tablets, 1 refill. Please advise.

## 2015-03-14 ENCOUNTER — Ambulatory Visit (INDEPENDENT_AMBULATORY_CARE_PROVIDER_SITE_OTHER): Payer: Medicare Other | Admitting: Obstetrics and Gynecology

## 2015-03-14 ENCOUNTER — Encounter: Payer: Self-pay | Admitting: Obstetrics and Gynecology

## 2015-03-14 VITALS — BP 130/76 | HR 84 | Resp 16 | Ht 65.0 in | Wt 154.0 lb

## 2015-03-14 DIAGNOSIS — Z79890 Hormone replacement therapy (postmenopausal): Secondary | ICD-10-CM | POA: Diagnosis not present

## 2015-03-14 DIAGNOSIS — Z01419 Encounter for gynecological examination (general) (routine) without abnormal findings: Secondary | ICD-10-CM | POA: Diagnosis not present

## 2015-03-14 NOTE — Patient Instructions (Signed)

## 2015-03-14 NOTE — Progress Notes (Addendum)
Patient ID: Sherry [name], female   DOB: [birth year], 69 y.o.   MRN: [id number] 69 y.o. [id number] Married Caucasian female here for annual exam.    On HRT.  Takes Lopreza.  No bleeding or spotting.  Would like to wean off.  Has about 30 tabs at home.  Took Doxycycline for a recent tick bite.  Saw her PCP.   Has 5 grandchildren.   PCP: R.Marcellus Scott, MD - will return this week for lab work.   Patient's last menstrual period was 04/14/1997.          Sexually active: Yes.   female.  The current method of family planning is post menopausal status.    Exercising: Yes.    walks dogs daily. Smoker:  Former, only smoked for 1-2 years in her early 20's  Health Maintenance: Pap:  12-08-13 Neg History of abnormal Pap:  Yes, hx of conization of cervix in her 20's, paps normal since. MMG:  08-28-14 3D density Cat.C/Neg,post reduction changes/BiRads2:[facility]. Colonoscopy:  2010 normal with Dr. Olevia Perches. Next due 2017. BMD:   2014  Result  Normal with Dr. Maxwell Caul TDaP:  2010 Screening Labs:  Hb today: PCP, Urine today: PCP   reports that she quit smoking about 46 years ago. Her smoking use included Cigarettes. She quit after 4 years of use. She has never used smokeless tobacco. She reports that she drinks about 3.6 oz of alcohol per week. She reports that she does not use illicit drugs.  Past Medical History  Diagnosis Date  . TMJ (dislocation of temporomandibular joint)   . Bronchitis   . Carpal tunnel syndrome     bilateral  . Chronic back pain     herniated disc,stenosis,radiculopathy,spondylolisthesis  . Chronic neck pain   . Eczema   . Hiatal hernia   . Constipation, chronic   . Hemorrhoids   . IBS (irritable bowel syndrome)     lactose intolerant  . Anxiety     valium prn  . Depression   . History of bronchitis     "I've had it a number of times"  . GERD (gastroesophageal reflux disease)   . Arthritis     "little in my fingers; shoulders"  . Enteritis 09/30/11     "localized into one section of small bowel"  . Hiatal hernia   . Osteoarthritis     Past Surgical History  Procedure Laterality Date  . Tonsillectomy      1951  . Appendectomy      1967  . Knee arthroscopy  1994    "don't remember which side"  . Incision and drainage abscess / hematoma of bursa / knee / thigh  1994    "day after knee scope"  . Back surgery    . Posterior fusion lumbar spine  02/2011    L3-L5  . Dilation and curettage of uterus  1988  . Breast reduction surgery  1988  . Breast biopsy  1980's    "twice on the left"  . Bladder suspension  1980's    "wasn't successful"  . Cervix lesion destruction      in her 20's    Current Outpatient Prescriptions  Medication Sig Dispense Refill  . azithromycin (ZITHROMAX) 250 MG tablet   0  . B Complex Vitamins (VITAMIN B COMPLEX PO) Take 1 tablet by mouth daily.    . benzonatate (TESSALON) 100 MG capsule   0  . chlorpheniramine-HYDROcodone (TUSSIONEX) 10-8 MG/5ML [facility]   0  .  Cholecalciferol (VITAMIN D) 2000 UNITS CAPS Take 1 capsule by mouth daily.    Marland Kitchen dicyclomine (BENTYL) 10 MG capsule     . fexofenadine (ALLEGRA) 180 MG tablet Take 180 mg by mouth at bedtime.    . hydrocortisone 2.5 % cream     . LOPREEZA 0.5-0.1 MG tablet TAKE 1 TABLET BY MOUTH EVERY MORNING 28 tablet 0  . LOTEMAX 0.5 % GEL   0  . MAGNESIUM CARBONATE PO Take 1 tablet by mouth daily.    . metroNIDAZOLE (METROGEL) 1 % gel     . naproxen sodium (ANAPROX) 220 MG tablet Take 220 mg by mouth as needed.    . Omega-3 Fatty Acids (FISH OIL PO) Take 1 tablet by mouth 2 (two) times daily.    . pantoprazole (PROTONIX) 40 MG tablet Take 1 tablet (40 mg total) by mouth 2 (two) times daily. (Patient taking differently: Take 40 mg by mouth daily. ) 60 tablet 6  . polyethylene glycol (MIRALAX) packet Take 17 g by mouth 3 (three) times daily. Until bowels move; maximum 2 consecutive days 6 each 0  . predniSONE (STERAPRED UNI-PAK) 10 MG tablet   0  . Probiotic Product  (PROBIOTIC DAILY PO) Take 1 tablet by mouth daily.    Marland Kitchen triamcinolone (NASACORT AQ) 55 MCG/ACT nasal inhaler Place 1 spray into the nose every morning.    . TURMERIC PO Take by mouth. Take 2 tablets in the morning and 1 at night    . zolpidem (AMBIEN) 10 MG tablet Take 2.5 mg by mouth at bedtime as needed for sleep.      No current facility-administered medications for this visit.    Family History  Problem Relation Age of Onset  . Anesthesia problems Neg Hx   . Hypotension Neg Hx   . Malignant hyperthermia Neg Hx   . Pseudochol deficiency Neg Hx   . Kidney disease Father   . Rheum arthritis Father   . Colon cancer Maternal Grandfather     questionable  . Parkinson's disease Mother   . Leukemia Sister   . Breast cancer Paternal Aunt     ROS:  Pertinent items are noted in HPI.  Otherwise, a comprehensive ROS was negative.  Exam:   BP 130/76 mmHg  Pulse 84  Resp 16  Ht 5\' 5"  (1.651 m)  Wt 154 lb (69.854 kg)  BMI 25.63 kg/m2  LMP 04/14/1997    General appearance: alert, cooperative and appears stated age Head: Normocephalic, without obvious abnormality, atraumatic Neck: no adenopathy, supple, symmetrical, trachea midline and thyroid normal to inspection and palpation Lungs: clear to auscultation bilaterally Breasts: normal appearance, no masses or tenderness, Inspection negative, No nipple retraction or dimpling, No nipple discharge or bleeding, No axillary or supraclavicular adenopathy.  Breast exam consistent with bilateral breast reduction.  Heart: regular rate and rhythm Abdomen: soft, non-tender; bowel sounds normal; no masses,  no organomegaly Extremities: extremities normal, atraumatic, no cyanosis or edema Skin: Skin color, texture, turgor normal. No rashes or lesions Lymph nodes: Cervical, supraclavicular, and axillary nodes normal. No abnormal inguinal nodes palpated Neurologic: Grossly normal  Pelvic: External genitalia:  no lesions              Urethra:  normal  appearing urethra with no masses, tenderness or lesions              Bartholins and Skenes: normal                 Vagina: normal  appearing vagina with normal color and discharge, no lesions              Cervix: no lesions              Pap taken: No. Bimanual Exam:  Uterus:  normal size, contour, position, consistency, mobility, non-tender              Adnexa: normal adnexa and no mass, fullness, tenderness              Rectovaginal: Yes.  .  Confirms.              Anus:  normal sphincter tone, no lesions  Chaperone was present for exam.  Assessment:   Well woman visit with normal exam. Remote hx of cervical conization.  HRT patient.   Plan: Yearly mammogram recommended after age 35.  Recommended self breast exam.  Pap and HR HPV as above. Discussed Calcium, Vitamin D, regular exercise program including cardiovascular and weight bearing exercise. Labs performed.  No..   See orders. Refills given on medications.  No..  See orders.  Will wean off HRT after discussing benefits and risks.  Has enough to take 1/2 tab for 30 days and then take 1/2 tab every other day for 30 days and then stop. Will call if has difficulty time with recurrence of menopausal symptoms. I also discussed vaginal estrogen for the patient is she finds she has dryness symptoms.  Follow up annually and prn.     After visit summary provided.

## 2015-05-21 ENCOUNTER — Telehealth: Payer: Self-pay | Admitting: Obstetrics and Gynecology

## 2015-05-21 MED ORDER — ESTRADIOL-NORETHINDRONE ACET 0.5-0.1 MG PO TABS: 1.0000 | ORAL_TABLET | Freq: Every morning | ORAL | Status: DC

## 2015-05-21 NOTE — Telephone Encounter (Signed)
OK to resume her Lopreeza, 1 po q day, until annual exam is due in November 2017.

## 2015-05-21 NOTE — Telephone Encounter (Signed)
Rx for Lopreeza .5-.1 mg #30 9 RF sent to [facility] [facility] [address]. Patient has been notified and is agreeable.  Routing to provider for final review. Patient agreeable to disposition. Will close encounter.

## 2015-05-21 NOTE — Telephone Encounter (Signed)
Spoke with patient. Patient reports she has been weaning off her Lopreeza rx since November. She has 3 tablets left. She is taking 1/2 tablet every other day. Reports she is having increased hot flashes that cause her to become "flushed all over." Patient would like to start back taking her Lopreeza daily. "I never had this problem before when I was taking it daily." Advised patient I will speak with Dr.Silva and return call with additional recommendations. She is agreeable. The patient is using [facility] off [address].

## 2015-05-21 NOTE — Telephone Encounter (Signed)
Patient would like to speak with nurse about going back on her hormone medication.

## 2015-07-31 ENCOUNTER — Other Ambulatory Visit: Payer: Self-pay

## 2015-07-31 DIAGNOSIS — Z9889 Other specified postprocedural states: Secondary | ICD-10-CM

## 2015-07-31 DIAGNOSIS — Z1231 Encounter for screening mammogram for malignant neoplasm of breast: Secondary | ICD-10-CM

## 2015-08-29 ENCOUNTER — Encounter: Payer: Self-pay | Admitting: Gastroenterology

## 2015-09-03 ENCOUNTER — Ambulatory Visit
Admission: RE | Admit: 2015-09-03 | Discharge: 2015-09-03 | Disposition: A | Discharge status: Home / Self Care | Payer: Medicare Other | Source: Ambulatory Visit

## 2015-09-03 DIAGNOSIS — Z9889 Other specified postprocedural states: Secondary | ICD-10-CM

## 2015-09-03 DIAGNOSIS — Z1231 Encounter for screening mammogram for malignant neoplasm of breast: Secondary | ICD-10-CM

## 2015-12-11 ENCOUNTER — Other Ambulatory Visit: Payer: Self-pay | Admitting: Gastroenterology

## 2016-03-06 ENCOUNTER — Other Ambulatory Visit: Payer: Self-pay | Admitting: Obstetrics and Gynecology

## 2016-03-10 NOTE — Telephone Encounter (Signed)
Medication refill request: LOPREEZA  Last AEX:  03/14/15 BS Next AEX: 04/17/16 Last MMG (if hormonal medication request): 09/03/15 BIRADS 1 Negative Refill authorized: 05/21/15 #30 w/9 refills; today please advise

## 2016-03-13 NOTE — Progress Notes (Signed)
70 y.o. G68P0013 Married Caucasian female here for annual exam.    Having vaginal dryness.  Had a rash on her labia which scabbed over and went away.   Happy on her HRT.  Wants to continue.   Episode of RLQ pain a few days ago which resolved.  PCP said it was "arthritis."    Current medical issues with arthritis and hiatal hernia.  Will need a knee replacement also.   Recent staph infection of her nose.  Dermatology treated with abx.  Tx with Mupiricin ointment.   PCP: Josetta Huddle, MD    Patient's last menstrual period was 04/14/1997.           Sexually active: Yes.   female The current method of family planning is post menopausal status.    Exercising: Yes.    Walk the dogs Smoker:  Former, only smoked for 1-2 years in her early 20's  Health Maintenance: Pap:  12-08-13 Neg History of abnormal Pap:  Yes,  hx of conization of cervix in her 20's, paps normal since. MMG:  09-03-15 Density C/Neg/BiRads1:TBC Colonoscopy:   11/2015 normal with Dr.James Edwards;next due 2027. BMD:  2014 Result  Normal with PCP--[facility] TDaP:  2010 Gardasil:   N/A Hep C:  Will discuss with PCP. Screening Labs:  Hb today: PCP, Urine today: PCP   reports that she quit smoking about 47 years ago. Her smoking use included Cigarettes. She quit after 4.00 years of use. She has never used smokeless tobacco. She reports that she drinks about 3.6 oz of alcohol per week . She reports that she does not use drugs.  Past Medical History:  Diagnosis Date  . Anxiety    valium prn  . Arthritis    "little in my fingers; shoulders"  . Bronchitis   . Carpal tunnel syndrome    bilateral  . Chronic back pain    herniated disc,stenosis,radiculopathy,spondylolisthesis  . Chronic neck pain   . Constipation, chronic   . Depression   . Eczema   . Enteritis 09/30/11   "localized into one section of small bowel"  . GERD (gastroesophageal reflux disease)   . Hemorrhoids   . Hiatal hernia   . Hiatal hernia   .  History of bronchitis    "I've had it a number of times"  . IBS (irritable bowel syndrome)    lactose intolerant  . Osteoarthritis   . Osteoarthritis    bilateral knees and shoulders  . TMJ (dislocation of temporomandibular joint)     Past Surgical History:  Procedure Laterality Date  . APPENDECTOMY     1967  . BACK SURGERY    . BLADDER SUSPENSION  1980's   "wasn't successful"  . BREAST BIOPSY  1980's   "twice on the left"  . BREAST REDUCTION SURGERY  1988  . CERVIX LESION DESTRUCTION     in her 74's  . DILATION AND CURETTAGE OF UTERUS  1988  . INCISION AND DRAINAGE ABSCESS / HEMATOMA OF BURSA / KNEE / [place]   "day after knee scope"  . KNEE ARTHROSCOPY  1994   "don't remember which side"  . POSTERIOR FUSION LUMBAR SPINE  02/2011   L3-L5  . TONSILLECTOMY     1951    Current Outpatient Prescriptions  Medication Sig Dispense Refill  . B Complex Vitamins (VITAMIN B COMPLEX PO) Take 1 tablet by mouth daily.    . Cholecalciferol (VITAMIN D) 2000 UNITS CAPS Take 1 capsule by mouth daily.    Marland Kitchen  famotidine (PEPCID) 20 MG tablet Take 1 tablet by mouth 2 (two) times daily.    . fexofenadine (ALLEGRA) 180 MG tablet Take 180 mg by mouth as needed.     Marland Kitchen LOPREEZA 0.5-0.1 MG tablet TAKE 1 TABLET BY MOUTH EVERY MORNING 28 tablet 1  . polyethylene glycol (MIRALAX) packet Take 17 g by mouth 3 (three) times daily. Until bowels move; maximum 2 consecutive days 6 each 0  . Probiotic Product (PROBIOTIC DAILY PO) Take 1 tablet by mouth daily.    . sertraline (ZOLOFT) 25 MG tablet Take 1 tablet by mouth daily.    . TURMERIC PO Take by mouth. Take 2 tablets in the morning and 1 at night    . zolpidem (AMBIEN) 10 MG tablet Take 2.5 mg by mouth at bedtime as needed for sleep.     . pantoprazole (PROTONIX) 40 MG tablet Take 1 tablet (40 mg total) by mouth 2 (two) times daily. (Patient not taking: Reported on 03/14/2016) 60 tablet 6   No current facility-administered medications for this visit.      Family History  Problem Relation Age of Onset  . Kidney disease Father   . Rheum arthritis Father   . Parkinson's disease Mother   . Leukemia Sister   . Colon cancer Maternal Grandfather     questionable  . Breast cancer Paternal Aunt   . Anesthesia problems Neg Hx   . Hypotension Neg Hx   . Malignant hyperthermia Neg Hx   . Pseudochol deficiency Neg Hx     ROS:  Pertinent items are noted in HPI.  Otherwise, a comprehensive ROS was negative.  Exam:   BP 126/70 (BP Location: Right Arm, Patient Position: Sitting, Cuff Size: Normal)   Pulse 70   Resp 14   Ht 5' 4.75" (1.645 m)   Wt 151 lb 6.4 oz (68.7 kg)   LMP 04/14/1997   BMI 25.39 kg/m     General appearance: alert, cooperative and appears stated age Head: Normocephalic, without obvious abnormality, atraumatic Neck: no adenopathy, supple, symmetrical, trachea midline and thyroid normal to inspection and palpation Lungs: clear to auscultation bilaterally Breasts: normal appearance, no masses or tenderness, No nipple retraction or dimpling, No nipple discharge or bleeding, No axillary or supraclavicular adenopathy Heart: regular rate and rhythm Abdomen: soft, non-tender; no masses, no organomegaly Extremities: extremities normal, atraumatic, no cyanosis or edema Skin: Skin color, texture, turgor normal. No rashes or lesions Lymph nodes: Cervical, supraclavicular, and axillary nodes normal. No abnormal inguinal nodes palpated Neurologic: Grossly normal  Pelvic: External genitalia:  no lesions              Urethra:  normal appearing urethra with no masses, tenderness or lesions              Bartholins and Skenes: normal                 Vagina: normal appearing vagina with normal color and discharge, no lesions              Cervix: no lesions              Pap taken: Yes.   Bimanual Exam:  Uterus:  normal size, contour, position, consistency, mobility, non-tender              Adnexa: no mass, fullness, tenderness               Rectal exam: Yes.  .  Confirms.  Anus:  normal sphincter tone, no lesions  Chaperone was present for exam.  Assessment:   Well woman visit with normal exam. Remote hx of cervical conization.  HRT patient.  Vaginal dryness.   Plan: Yearly mammogram recommended after age 69.  Recommended self breast exam.  Pap and HR HPV as above. Discussed Calcium, Vitamin D, regular exercise program including cardiovascular and weight bearing exercise. Refill Lopreeza.  Discussed WHI and risks of HRT - MI, stroke, DVT, PE, and breast cancer.  Will start Estrace vaginal cream.  Instructed in use.    Follow up annually and prn.      After visit summary provided.

## 2016-03-14 ENCOUNTER — Encounter: Payer: Self-pay | Admitting: Obstetrics and Gynecology

## 2016-03-14 ENCOUNTER — Ambulatory Visit (INDEPENDENT_AMBULATORY_CARE_PROVIDER_SITE_OTHER): Payer: Medicare Other | Admitting: Obstetrics and Gynecology

## 2016-03-14 VITALS — BP 126/70 | HR 70 | Resp 14 | Ht 64.75 in | Wt 151.4 lb

## 2016-03-14 DIAGNOSIS — Z01419 Encounter for gynecological examination (general) (routine) without abnormal findings: Secondary | ICD-10-CM

## 2016-03-14 DIAGNOSIS — N898 Other specified noninflammatory disorders of vagina: Secondary | ICD-10-CM

## 2016-03-14 MED ORDER — ESTRADIOL-NORETHINDRONE ACET 0.5-0.1 MG PO TABS: 1.0000 | ORAL_TABLET | Freq: Every morning | ORAL | 11 refills | Status: DC

## 2016-03-14 MED ORDER — ESTRADIOL 0.1 MG/GM VA CREA: TOPICAL_CREAM | VAGINAL | 2 refills | Status: DC

## 2016-03-14 NOTE — Patient Instructions (Signed)
Ask you primary care provider for the hepatitis C test and the hemoglobin A1C!  EXERCISE AND DIET:  We recommended that you start or continue a regular exercise program for good health. Regular exercise means any activity that makes your heart beat faster and makes you sweat.  We recommend exercising at least 30 minutes per day at least 3 days a week, preferably 4 or 5.  We also recommend a diet low in fat and sugar.  Inactivity, poor dietary choices and obesity can cause diabetes, heart attack, stroke, and kidney damage, among others.    ALCOHOL AND SMOKING:  Women should limit their alcohol intake to no more than 7 drinks/beers/glasses of wine (combined, not each!) per week. Moderation of alcohol intake to this level decreases your risk of breast cancer and liver damage. And of course, no recreational drugs are part of a healthy lifestyle.  And absolutely no smoking or even second hand smoke. Most people know smoking can cause heart and lung diseases, but did you know it also contributes to weakening of your bones? Aging of your skin?  Yellowing of your teeth and nails?  CALCIUM AND VITAMIN D:  Adequate intake of calcium and Vitamin D are recommended.  The recommendations for exact amounts of these supplements seem to change often, but generally speaking 600 mg of calcium (either carbonate or citrate) and 800 units of Vitamin D per day seems prudent. Certain women may benefit from higher intake of Vitamin D.  If you are among these women, your doctor will have told you during your visit.    PAP SMEARS:  Pap smears, to check for cervical cancer or precancers,  have traditionally been done yearly, although recent scientific advances have shown that most women can have pap smears less often.  However, every woman still should have a physical exam from her gynecologist every year. It will include a breast check, inspection of the vulva and vagina to check for abnormal growths or skin changes, a visual exam of  the cervix, and then an exam to evaluate the size and shape of the uterus and ovaries.  And after 69 years of age, a rectal exam is indicated to check for rectal cancers. We will also provide age appropriate advice regarding health maintenance, like when you should have certain vaccines, screening for sexually transmitted diseases, bone density testing, colonoscopy, mammograms, etc.   MAMMOGRAMS:  All women over 69 years old should have a yearly mammogram. Many facilities now offer a "3D" mammogram, which may cost around $50 extra out of pocket. If possible,  we recommend you accept the option to have the 3D mammogram performed.  It both reduces the number of women who will be called back for extra views which then turn out to be normal, and it is better than the routine mammogram at detecting truly abnormal areas.    COLONOSCOPY:  Colonoscopy to screen for colon cancer is recommended for all women at age 69.  We know, you hate the idea of the prep.  We agree, BUT, having colon cancer and not knowing it is worse!!  Colon cancer so often starts as a polyp that can be seen and removed at colonscopy, which can quite literally save your life!  And if your first colonoscopy is normal and you have no family history of colon cancer, most women don't have to have it again for 10 years.  Once every ten years, you can do something that may end up saving your life, right?  We will be happy to help you get it scheduled when you are ready.  Be sure to check your insurance coverage so you understand how much it will cost.  It may be covered as a preventative service at no cost, but you should check your particular policy.

## 2016-03-18 ENCOUNTER — Telehealth: Payer: Self-pay | Admitting: Obstetrics and Gynecology

## 2016-03-18 LAB — IPS PAP SMEAR ONLY

## 2016-03-18 NOTE — Telephone Encounter (Signed)
I discussed Hep C testing.  I do not recall recommending hemoglobin A1C.

## 2016-03-18 NOTE — Telephone Encounter (Signed)
Patient called requesting to speak with the nurse about tests Dr. Quincy Simmonds is recommending she have done at her PCP's office.  The patient said the information was not on her after visit summary but it was supposed to be.

## 2016-03-18 NOTE — Telephone Encounter (Signed)
Spoke with patient. Advised I have reviewed her OV from 03/14/2016 with Dr.Silva. Dr.Silva recommends that the patient have a hepatitis C test and hemoglobin A1C level checked with her labs at her PCP. Patient verbalizes understanding.  Routing to provider for final review. Patient agreeable to disposition. Will close encounter.

## 2016-03-19 NOTE — Telephone Encounter (Signed)
Reviewed with Dr.Silva as AVS mentions Hep C and Hemoglobin A1C level to ensure patient only needs Hep C. Spoke with patient. Advised of message as seen below from [place]. Patient is agreeable.  Routing to provider for final review. Patient agreeable to disposition. Will close encounter.

## 2016-03-25 ENCOUNTER — Telehealth: Payer: Self-pay | Admitting: Obstetrics and Gynecology

## 2016-03-25 NOTE — Telephone Encounter (Signed)
Patient has a question about how to take the medication Dr Quincy Simmonds gave her

## 2016-03-25 NOTE — Telephone Encounter (Signed)
Spoke with patient. Patient calling to verify dosage of estrace vaginal cream. Patient states she feels like she has used too much and it is not going to last. Patient states she was filling tube halfway. Reviewed dosage and tube dosing on tube. Patient states she was not reading tube right and used too much. Advised patient to fill to the 0.5 g mark, which is 1/2 g.  Advised patient Use 1/2 g to vagina and vulva every night for the first 2 weeks, then use 1/2 g vaginally two times per week. Patient states she sees each tick mark labled, use the 0.5g. Patient verbalizes understanding is agreeable. Advised to return call for any additional questions/concerns.  Routing to provider for final review. Patient is agreeable to disposition. Will close encounter.

## 2016-04-17 ENCOUNTER — Ambulatory Visit: Payer: Medicare Other | Admitting: Obstetrics and Gynecology

## 2016-05-06 ENCOUNTER — Other Ambulatory Visit: Payer: Self-pay | Admitting: Obstetrics and Gynecology

## 2016-05-20 ENCOUNTER — Other Ambulatory Visit: Payer: Self-pay | Admitting: Internal Medicine

## 2016-05-20 ENCOUNTER — Ambulatory Visit
Admission: RE | Admit: 2016-05-20 | Discharge: 2016-05-20 | Disposition: A | Discharge status: Home / Self Care | Payer: Medicare Other | Source: Ambulatory Visit | Attending: Internal Medicine | Admitting: Internal Medicine

## 2016-05-20 DIAGNOSIS — R0989 Other specified symptoms and signs involving the circulatory and respiratory systems: Secondary | ICD-10-CM

## 2016-06-16 ENCOUNTER — Ambulatory Visit (INDEPENDENT_AMBULATORY_CARE_PROVIDER_SITE_OTHER): Payer: Medicare Other | Admitting: Podiatry

## 2016-06-16 ENCOUNTER — Encounter: Payer: Self-pay | Admitting: Podiatry

## 2016-06-16 DIAGNOSIS — L84 Corns and callosities: Secondary | ICD-10-CM

## 2016-06-16 NOTE — Progress Notes (Signed)
Subjective:     Patient ID: Sherry [name], female   DOB: Jun 14, 1945, 71 y.o.   MRN: [id number]  HPI patient presents with lesions on bottom of left foot   Review of Systems     Objective:   Physical Exam Neurovascular status intact keratotic lesion sub-left fourth metatarsal    Assessment:     Porokeratotic lesions    Plan:     Debride lesion with no iatrogenic bleeding noted

## 2016-07-24 ENCOUNTER — Other Ambulatory Visit: Payer: Self-pay | Admitting: Obstetrics and Gynecology

## 2016-07-24 DIAGNOSIS — Z1231 Encounter for screening mammogram for malignant neoplasm of breast: Secondary | ICD-10-CM

## 2016-09-17 ENCOUNTER — Ambulatory Visit: Payer: Medicare Other

## 2016-09-22 ENCOUNTER — Other Ambulatory Visit: Payer: Self-pay | Admitting: Internal Medicine

## 2016-09-22 ENCOUNTER — Ambulatory Visit
Admission: RE | Admit: 2016-09-22 | Discharge: 2016-09-22 | Disposition: A | Discharge status: Home / Self Care | Payer: Medicare Other | Source: Ambulatory Visit | Attending: Internal Medicine | Admitting: Internal Medicine

## 2016-09-22 DIAGNOSIS — R05 Cough: Secondary | ICD-10-CM

## 2016-09-22 DIAGNOSIS — R059 Cough, unspecified: Secondary | ICD-10-CM

## 2016-10-09 ENCOUNTER — Ambulatory Visit
Admission: RE | Admit: 2016-10-09 | Discharge: 2016-10-09 | Disposition: A | Discharge status: Home / Self Care | Payer: Medicare Other | Source: Ambulatory Visit | Attending: Obstetrics and Gynecology | Admitting: Obstetrics and Gynecology

## 2016-10-09 DIAGNOSIS — Z1231 Encounter for screening mammogram for malignant neoplasm of breast: Secondary | ICD-10-CM

## 2016-11-28 ENCOUNTER — Ambulatory Visit
Admission: RE | Admit: 2016-11-28 | Discharge: 2016-11-28 | Disposition: A | Discharge status: Home / Self Care | Payer: Medicare Other | Source: Ambulatory Visit | Attending: Internal Medicine | Admitting: Internal Medicine

## 2016-11-28 ENCOUNTER — Other Ambulatory Visit: Payer: Self-pay | Admitting: Internal Medicine

## 2016-11-28 DIAGNOSIS — S0990XA Unspecified injury of head, initial encounter: Secondary | ICD-10-CM

## 2016-11-28 DIAGNOSIS — R519 Headache, unspecified: Secondary | ICD-10-CM

## 2016-11-28 DIAGNOSIS — R270 Ataxia, unspecified: Secondary | ICD-10-CM

## 2016-11-28 DIAGNOSIS — R51 Headache: Secondary | ICD-10-CM

## 2017-03-18 ENCOUNTER — Encounter: Payer: Self-pay | Admitting: Podiatry

## 2017-03-18 ENCOUNTER — Ambulatory Visit: Payer: Medicare Other | Admitting: Podiatry

## 2017-03-18 DIAGNOSIS — L84 Corns and callosities: Secondary | ICD-10-CM | POA: Diagnosis not present

## 2017-03-18 NOTE — Progress Notes (Signed)
Subjective:   Patient ID: Sherry [name], female   DOB: 71 y.o.   MRN: [id number]   HPI Patient presents with painful callus on the plantar left foot that feels like she is walking on a rock neurovascular status intact with plantar keratotic lesion left   ROS      Objective:  Physical Exam  Keratotic lesion noted sub-metatarsal left     Assessment:  Chronic lesion left     Plan:  Debride painful lesion left

## 2017-03-24 ENCOUNTER — Ambulatory Visit: Payer: Self-pay | Admitting: Orthopedic Surgery

## 2017-04-16 ENCOUNTER — Other Ambulatory Visit: Payer: Self-pay | Admitting: Obstetrics and Gynecology

## 2017-04-17 NOTE — Telephone Encounter (Signed)
Medication refill request: LOPREEZA Last AEX:  03/14/16 BS  Next AEX: 04/30/17 Last MMG (if hormonal medication request): 10/09/16 BIRADS 2 benign/density c Refill authorized: 03/14/16 #28 w/11 refills; today please advise

## 2017-04-20 ENCOUNTER — Other Ambulatory Visit: Payer: Self-pay | Admitting: Obstetrics & Gynecology

## 2017-04-20 NOTE — Telephone Encounter (Signed)
Medication refill request:Loprezza (90 day )  Last AEX:  03-14-16  Next AEX: 04-30-17  Last MMG (if hormonal medication request): 10-09-16  Refill authorized: please advise

## 2017-04-22 ENCOUNTER — Ambulatory Visit: Payer: Medicare Other | Admitting: Obstetrics and Gynecology

## 2017-04-23 NOTE — Telephone Encounter (Signed)
Please make sure pt is aware she needs to let ortho know she is on estrogen as it looks like she is going to have a knee replacement.  The surgeon may want her off of this around the time of her surgery.  Refill has been done.  Thanks.

## 2017-04-24 NOTE — Telephone Encounter (Signed)
Tried calling patient to notify of the following message. No answer, per DPR okay to leave a detailed message at [phone]. Detailed message left for patient. Advised if any questions, give [place] a call on Monday at our office. Closing encounter.

## 2017-04-30 ENCOUNTER — Ambulatory Visit: Payer: Medicare Other | Admitting: Obstetrics and Gynecology

## 2017-05-18 NOTE — Progress Notes (Signed)
72 y.o. G58P0013 Married Caucasian female here for annual exam.    Having bilateral knee replacement next week with Dr. Maureen Ralphs.  Stopped her HRT a couple of weeks ago.  May want to restart after her surgery.   Some difficulty due to shoulder aching.  Took Advil to help with the pain.   No vaginal bleeding or spotting.   ROS - Constipation, hemorrhoids and can bleed rarely, muscle/joint pain, rash/ (saw dermatology already). Uses Miralax.   Labs and vaccines with PCP.   PCP:  R.Marcellus Scott, MD  Patient's last menstrual period was 04/14/1997.           Sexually active: Yes.    The current method of family planning is post menopausal status.    Exercising: Yes.    gym and trainer Smoker:  Former, only smoked 1-2 years in her 20's  Health Maintenance: Pap: 03-14-16 Neg, 12-08-13 Neg History of abnormal Pap:  Yes,hx of conization of cervix in her 20's, paps normal since. MMG: 10-09-16 Density C/Neg/BiRads2 Colonoscopy: 11/2015 normal with Dr.James Edwards;next due 2027.  BMD:  2014  Result : normal with PCP TDaP: 2010 Gardasil:   no HIV:no Hep C:no Screening Labs:  Hb today: PCP, Urine today: not done   reports that she quit smoking about 49 years ago. Her smoking use included cigarettes. She quit after 4.00 years of use. she has never used smokeless tobacco. She reports that she drinks about 3.6 oz of alcohol per week. She reports that she does not use drugs.  Past Medical History:  Diagnosis Date  . Anxiety    valium prn  . Arthritis    "little in my fingers; shoulders"  . Bronchitis   . Carpal tunnel syndrome    bilateral  . Chronic back pain    herniated disc,stenosis,radiculopathy,spondylolisthesis  . Chronic neck pain   . Constipation, chronic   . Depression   . Eczema   . Enteritis 09/30/11   "localized into one section of small bowel"  . GERD (gastroesophageal reflux disease)   . Hemorrhoids   . Hiatal hernia   . Hiatal hernia   . History of bronchitis     "I've had it a number of times"  . IBS (irritable bowel syndrome)    lactose intolerant  . Osteoarthritis   . Osteoarthritis    bilateral knees and shoulders  . TMJ (dislocation of temporomandibular joint)     Past Surgical History:  Procedure Laterality Date  . APPENDECTOMY     1967  . BACK SURGERY    . BLADDER SUSPENSION  1980's   "wasn't successful"  . BREAST BIOPSY  1980's   "twice on the left"  . BREAST REDUCTION SURGERY  1988  . CERVIX LESION DESTRUCTION     in her 24's  . DILATION AND CURETTAGE OF UTERUS  1988  . INCISION AND DRAINAGE ABSCESS / HEMATOMA OF BURSA / KNEE / [place]   "day after knee scope"  . KNEE ARTHROSCOPY  1994   "don't remember which side"  . POSTERIOR FUSION LUMBAR SPINE  02/2011   L3-L5  . REDUCTION MAMMAPLASTY Bilateral   . TONSILLECTOMY     1951    Current Outpatient Medications  Medication Sig Dispense Refill  . B Complex Vitamins (VITAMIN B COMPLEX PO) Take 1 tablet by mouth daily.    Marland Kitchen dicyclomine (BENTYL) 10 MG capsule Take 10 mg by mouth 3 (three) times daily as needed for spasms.    . ergocalciferol (VITAMIN  D2) 50000 units capsule Take 50,000 Units by mouth every Monday.    . fexofenadine (ALLEGRA) 180 MG tablet Take 180 mg by mouth as needed.     . polyethylene glycol (MIRALAX) packet Take 17 g by mouth 3 (three) times daily. Until bowels move; maximum 2 consecutive days (Patient taking differently: Take 17 g by mouth every other day. Until bowels move; maximum 2 consecutive days) 6 each 0  . Probiotic Product (PROBIOTIC DAILY PO) Take 1 tablet by mouth every other day.      No current facility-administered medications for this visit.     Family History  Problem Relation Age of Onset  . Kidney disease Father   . Rheum arthritis Father   . Parkinson's disease Mother   . Leukemia Sister   . Colon cancer Maternal Grandfather        questionable  . Breast cancer Paternal Aunt   . Anesthesia problems Neg Hx   . Hypotension  Neg Hx   . Malignant hyperthermia Neg Hx   . Pseudochol deficiency Neg Hx     ROS:  Pertinent items are noted in HPI.  Otherwise, a comprehensive ROS was negative.  Exam:   BP 132/68 (BP Location: Right Arm, Patient Position: Sitting, Cuff Size: Normal)   Pulse 80   Resp 14   Ht 5' 4.5" (1.638 m)   Wt 154 lb 8 oz (70.1 kg)   LMP 04/14/1997   BMI 26.11 kg/m     General appearance: alert, cooperative and appears stated age Head: Normocephalic, without obvious abnormality, atraumatic Neck: no adenopathy, supple, symmetrical, trachea midline and thyroid normal to inspection and palpation Lungs: clear to auscultation bilaterally Breasts:  Scars consisitent with bilateral reduction, no masses or tenderness, No nipple retraction or dimpling, No nipple discharge or bleeding, No axillary or supraclavicular adenopathy Heart: regular rate and rhythm Abdomen: soft, non-tender; no masses, no organomegaly Extremities: extremities normal, atraumatic, no cyanosis or edema Skin: Skin color, texture, turgor normal. No rashes or lesions Lymph nodes: Cervical, supraclavicular, and axillary nodes normal. No abnormal inguinal nodes palpated Neurologic: Grossly normal  Pelvic: External genitalia:  no lesions              Urethra:  normal appearing urethra with no masses, tenderness or lesions              Bartholins and Skenes: normal                 Vagina: normal appearing vagina with normal color and discharge, no lesions              Cervix: no lesions              Pap taken: No. Bimanual Exam:  Uterus:  normal size, contour, position, consistency, mobility, non-tender              Adnexa: no mass, fullness, tenderness              Rectal exam: Yes.  .  Confirms.              Anus:  normal sphincter tone, no lesions  Chaperone was present for exam.  Assessment:   Well woman visit with normal exam. Remote hx of cervical conization.  Off HRT.  Upcoming bilateral knee  replacements.  Plan: Mammogram screening discussed. Recommended self breast awareness. Pap and HR HPV as above. Guidelines for Calcium, Vitamin D, regular exercise program including cardiovascular and weight bearing exercise. I support her being  off her HRT.  We did discuss risk of being off for several months and then restarting HRT - risk of DVT, PE, MI, stroke and breast cancer.   We discussed SSRIs and SNRIs for vasomotor symptoms if needed.  Follow up annually and prn.   After visit summary provided.

## 2017-05-21 ENCOUNTER — Encounter (HOSPITAL_COMMUNITY): Payer: Self-pay

## 2017-05-21 ENCOUNTER — Encounter: Payer: Self-pay | Admitting: Obstetrics and Gynecology

## 2017-05-21 ENCOUNTER — Other Ambulatory Visit: Payer: Self-pay

## 2017-05-21 ENCOUNTER — Ambulatory Visit (INDEPENDENT_AMBULATORY_CARE_PROVIDER_SITE_OTHER): Payer: Medicare Other | Admitting: Obstetrics and Gynecology

## 2017-05-21 VITALS — BP 132/68 | HR 80 | Resp 14 | Ht 64.5 in | Wt 154.5 lb

## 2017-05-21 DIAGNOSIS — Z01419 Encounter for gynecological examination (general) (routine) without abnormal findings: Secondary | ICD-10-CM

## 2017-05-21 NOTE — Patient Instructions (Addendum)
Sherry [name]  05/21/2017   Your procedure is scheduled on: 05/27/2017    Report to [facility] Main  Entrance  Report to admitting at   [place] AM   Call this number if you have problems the morning of surgery [phone]   Remember: Do not eat food or drink liquids :After Midnight.     Take these medicines the morning of surgery with A SIP OF WATER:   Allegra if needed, Probiotic, Prilosec                                You may not have any metal on your body including hair pins and              piercings  Do not wear jewelry, make-up, lotions, powders or perfumes, deodorant             Do not wear nail polish.  Do not shave  48 hours prior to surgery.               Do not bring valuables to the hospital. [place].  Contacts, dentures or bridgework may not be worn into surgery.  Leave suitcase in the car. After surgery it may be brought to your room.                   Please read over the following fact sheets you were given: _____________________________________________________________________             [facility] - Preparing for Surgery Before surgery, you can play an important role.  Because skin is not sterile, your skin needs to be as free of germs as possible.  You can reduce the number of germs on your skin by washing with CHG (chlorahexidine gluconate) soap before surgery.  CHG is an antiseptic cleaner which kills germs and bonds with the skin to continue killing germs even after washing. Please DO NOT use if you have an allergy to CHG or antibacterial soaps.  If your skin becomes reddened/irritated stop using the CHG and inform your nurse when you arrive at Short Stay. Do not shave (including legs and underarms) for at least 48 hours prior to the first CHG shower.  You may shave your face/neck. Please follow these instructions carefully:  1.  Shower with CHG Soap the night before surgery and  the  morning of Surgery.  2.  If you choose to wash your hair, wash your hair first as usual with your  normal  shampoo.  3.  After you shampoo, rinse your hair and body thoroughly to remove the  shampoo.                           4.  Use CHG as you would any other liquid soap.  You can apply chg directly  to the skin and wash                       Gently with a scrungie or clean washcloth.  5.  Apply the CHG Soap to your body ONLY FROM THE NECK DOWN.   Do not use on face/ open  Wound or open sores. Avoid contact with eyes, ears mouth and genitals (private parts).                       Wash face,  Genitals (private parts) with your normal soap.             6.  Wash thoroughly, paying special attention to the area where your surgery  will be performed.  7.  Thoroughly rinse your body with warm water from the neck down.  8.  DO NOT shower/wash with your normal soap after using and rinsing off  the CHG Soap.                9.  Pat yourself dry with a clean towel.            10.  Wear clean pajamas.            11.  Place clean sheets on your bed the night of your first shower and do not  sleep with pets. Day of Surgery : Do not apply any lotions/deodorants the morning of surgery.  Please wear clean clothes to the hospital/surgery center.  FAILURE TO FOLLOW THESE INSTRUCTIONS MAY RESULT IN THE CANCELLATION OF YOUR SURGERY PATIENT SIGNATURE_________________________________  NURSE SIGNATURE__________________________________  ________________________________________________________________________  WHAT IS A BLOOD TRANSFUSION? Blood Transfusion Information  A transfusion is the replacement of blood or some of its parts. Blood is made up of multiple cells which provide different functions.  Red blood cells carry oxygen and are used for blood loss replacement.  White blood cells fight against infection.  Platelets control bleeding.  Plasma helps clot blood.  Other  blood products are available for specialized needs, such as hemophilia or other clotting disorders. BEFORE THE TRANSFUSION  Who gives blood for transfusions?   Healthy volunteers who are fully evaluated to make sure their blood is safe. This is blood bank blood. Transfusion therapy is the safest it has ever been in the practice of medicine. Before blood is taken from a donor, a complete history is taken to make sure that person has no history of diseases nor engages in risky social behavior (examples are intravenous drug use or sexual activity with multiple partners). The donor's travel history is screened to minimize risk of transmitting infections, such as malaria. The donated blood is tested for signs of infectious diseases, such as HIV and hepatitis. The blood is then tested to be sure it is compatible with you in order to minimize the chance of a transfusion reaction. If you or a relative donates blood, this is often done in anticipation of surgery and is not appropriate for emergency situations. It takes many days to process the donated blood. RISKS AND COMPLICATIONS Although transfusion therapy is very safe and saves many lives, the main dangers of transfusion include:   Getting an infectious disease.  Developing a transfusion reaction. This is an allergic reaction to something in the blood you were given. Every precaution is taken to prevent this. The decision to have a blood transfusion has been considered carefully by your caregiver before blood is given. Blood is not given unless the benefits outweigh the risks. AFTER THE TRANSFUSION  Right after receiving a blood transfusion, you will usually feel much better and more energetic. This is especially true if your red blood cells have gotten low (anemic). The transfusion raises the level of the red blood cells which carry oxygen, and this usually causes an energy increase.  The  nurse administering the transfusion will monitor you carefully  for complications. HOME CARE INSTRUCTIONS  No special instructions are needed after a transfusion. You may find your energy is better. Speak with your caregiver about any limitations on activity for underlying diseases you may have. SEEK MEDICAL CARE IF:   Your condition is not improving after your transfusion.  You develop redness or irritation at the intravenous (IV) site. SEEK IMMEDIATE MEDICAL CARE IF:  Any of the following symptoms occur over the next 12 hours:  Shaking chills.  You have a temperature by mouth above 102 F (38.9 C), not controlled by medicine.  Chest, back, or muscle pain.  People around you feel you are not acting correctly or are confused.  Shortness of breath or difficulty breathing.  Dizziness and fainting.  You get a rash or develop hives.  You have a decrease in urine output.  Your urine turns a dark color or changes to pink, red, or brown. Any of the following symptoms occur over the next 10 days:  You have a temperature by mouth above 102 F (38.9 C), not controlled by medicine.  Shortness of breath.  Weakness after normal activity.  The white part of the eye turns yellow (jaundice).  You have a decrease in the amount of urine or are urinating less often.  Your urine turns a dark color or changes to pink, red, or brown. Document Released: 03/28/2000 Document Revised: 06/23/2011 Document Reviewed: 11/15/2007 ExitCare Patient Information 2014 [place].  _______________________________________________________________________  Incentive Spirometer  An incentive spirometer is a tool that can help keep your lungs clear and active. This tool measures how well you are filling your lungs with each breath. Taking long deep breaths may help reverse or decrease the chance of developing breathing (pulmonary) problems (especially infection) following:  A long period of time when you are unable to move or be active. BEFORE THE PROCEDURE    If the spirometer includes an indicator to show your best effort, your nurse or respiratory therapist will set it to a desired goal.  If possible, sit up straight or lean slightly forward. Try not to slouch.  Hold the incentive spirometer in an upright position. INSTRUCTIONS FOR USE  1. Sit on the edge of your bed if possible, or sit up as far as you can in bed or on a chair. 2. Hold the incentive spirometer in an upright position. 3. Breathe out normally. 4. Place the mouthpiece in your mouth and seal your lips tightly around it. 5. Breathe in slowly and as deeply as possible, raising the piston or the ball toward the top of the column. 6. Hold your breath for 3-5 seconds or for as long as possible. Allow the piston or ball to fall to the bottom of the column. 7. Remove the mouthpiece from your mouth and breathe out normally. 8. Rest for a few seconds and repeat Steps 1 through 7 at least 10 times every 1-2 hours when you are awake. Take your time and take a few normal breaths between deep breaths. 9. The spirometer may include an indicator to show your best effort. Use the indicator as a goal to work toward during each repetition. 10. After each set of 10 deep breaths, practice coughing to be sure your lungs are clear. If you have an incision (the cut made at the time of surgery), support your incision when coughing by placing a pillow or rolled up towels firmly against it. Once you are able to get  out of bed, walk around indoors and cough well. You may stop using the incentive spirometer when instructed by your caregiver.  RISKS AND COMPLICATIONS  Take your time so you do not get dizzy or light-headed.  If you are in pain, you may need to take or ask for pain medication before doing incentive spirometry. It is harder to take a deep breath if you are having pain. AFTER USE  Rest and breathe slowly and easily.  It can be helpful to keep track of a log of your progress. Your caregiver  can provide you with a simple table to help with this. If you are using the spirometer at home, follow these instructions: [place] IF:   You are having difficultly using the spirometer.  You have trouble using the spirometer as often as instructed.  Your pain medication is not giving enough relief while using the spirometer.  You develop fever of 100.5 F (38.1 C) or higher. SEEK IMMEDIATE MEDICAL CARE IF:   You cough up bloody sputum that had not been present before.  You develop fever of 102 F (38.9 C) or greater.  You develop worsening pain at or near the incision site. MAKE SURE YOU:   Understand these instructions.  Will watch your condition.  Will get help right away if you are not doing well or get worse. Document Released: 08/11/2006 Document Revised: 06/23/2011 Document Reviewed: 10/12/2006 [facility] Patient Information 2014 [place], [place].   ________________________________________________________________________

## 2017-05-21 NOTE — Patient Instructions (Signed)

## 2017-05-22 ENCOUNTER — Encounter (HOSPITAL_COMMUNITY)
Admission: RE | Admit: 2017-05-22 | Discharge: 2017-05-22 | Disposition: A | Discharge status: Home / Self Care | Payer: Medicare Other | Source: Ambulatory Visit | Attending: Orthopedic Surgery | Admitting: Orthopedic Surgery

## 2017-05-22 ENCOUNTER — Encounter (HOSPITAL_COMMUNITY): Payer: Self-pay

## 2017-05-22 ENCOUNTER — Other Ambulatory Visit: Payer: Self-pay

## 2017-05-22 DIAGNOSIS — M170 Bilateral primary osteoarthritis of knee: Secondary | ICD-10-CM | POA: Insufficient documentation

## 2017-05-22 DIAGNOSIS — Z01812 Encounter for preprocedural laboratory examination: Secondary | ICD-10-CM | POA: Diagnosis not present

## 2017-05-22 HISTORY — DX: Carpal tunnel syndrome, bilateral upper limbs: G56.03

## 2017-05-22 HISTORY — DX: Spondylosis without myelopathy or radiculopathy, site unspecified: M47.819

## 2017-05-22 HISTORY — DX: Presence of spectacles and contact lenses: Z97.3

## 2017-05-22 LAB — COMPREHENSIVE METABOLIC PANEL
ALT: 23 U/L (ref 14–54)
AST: 30 U/L (ref 15–41)
Albumin: 4.2 g/dL (ref 3.5–5.0)
Alkaline Phosphatase: 64 U/L (ref 38–126)
Anion gap: 6 (ref 5–15)
BUN: 12 mg/dL (ref 6–20)
CO2: 29 mmol/L (ref 22–32)
Calcium: 9.2 mg/dL (ref 8.9–10.3)
Chloride: 103 mmol/L (ref 101–111)
Creatinine, Ser: 0.74 mg/dL (ref 0.44–1.00)
GFR calc Af Amer: >60 mL/min (ref >60)
GFR calc non Af Amer: >60 mL/min (ref >60)
Glucose, Bld: 90 mg/dL (ref 65–99)
Potassium: 4.5 mmol/L (ref 3.5–5.1)
Sodium: 138 mmol/L (ref 135–145)
Total Bilirubin: 0.3 mg/dL (ref 0.3–1.2)
Total Protein: 6.6 g/dL (ref 6.5–8.1)

## 2017-05-22 LAB — PROTIME-INR
INR: 0.93
Prothrombin Time: 12.4 seconds (ref 11.4–15.2)

## 2017-05-22 LAB — CBC
HCT: 43.8 % (ref 36.0–46.0)
Hemoglobin: 14.9 g/dL (ref 12.0–15.0)
MCH: 31.9 pg (ref 26.0–34.0)
MCHC: 34 g/dL (ref 30.0–36.0)
MCV: 93.8 fL (ref 78.0–100.0)
Platelets: 300 10*3/uL (ref 150–400)
RBC: 4.67 MIL/uL (ref 3.87–5.11)
RDW: 12.8 % (ref 11.5–15.5)
WBC: 5.8 10*3/uL (ref 4.0–10.5)

## 2017-05-22 LAB — ABO/RH: ABO/RH(D): O NEG

## 2017-05-22 LAB — SURGICAL PCR SCREEN
MRSA, PCR: NEGATIVE
Staphylococcus aureus: POSITIVE — AB

## 2017-05-22 LAB — APTT: aPTT: 34 seconds (ref 24–36)

## 2017-05-24 ENCOUNTER — Ambulatory Visit: Payer: Self-pay | Admitting: Orthopedic Surgery

## 2017-05-24 NOTE — H&P (View-Only) (Signed)
[place], [place] [phone], F)  DOB 1945/09/12  Chief Complaint Bilateral Knee Pain   Vitals Ht: 5 ft 5 in  Wt: 151 lbs  BMI: 25.1 Pain Scale: 6 Pain Scale Type: Numeric BP - 124/68 Pulse - 84  Allergies Reviewed Allergies MASTISOL LIQUID ADHESIVE: Itching (Severe)  NKDA  Medications Reviewed Medications melatonin 05/12/17   entered Loura Back Miralax 05/12/17   entered [place] 05/12/17   entered Loura Back   Problems Reviewed Problems Osteoarthritis of knees   Family History Father - Arthritis Mother - Arthritis   Social History Smoking Status: Former smoker Non-smoker Tobacco-years of use: 3 Chewing tobacco: none Alcohol intake: Occasional Hand Dominance: Right Work related injury?: N Advance directive: Y Medical Power of Attorney: N   Surgical History Reviewed Surgical History Lumbar Laminectomy - 04/14/2012 Hand Surgery - 04/15/2011 LASIK - 04/15/2003 Arthroscopy - 04/14/1992 Knee Arthroscopy - 04/14/1992 Appendectomy - 04/14/1965 Adenoid Surgery - 04/14/1950 Tonsillectomy - 04/14/1950   GYN History Most Recent Mammogram: 01/26/2017.   Past Medical History Reviewed Past Medical History Joint Pain: Y Osteoarthritis: Y Constipative IBS GERD   HPI Patient is a 72 year old female scheduled for bilateral total knee arthroplasty by Dr. Wynelle Link on 05/27/2017 at [facility]. The patient is a 72 year old female who is being followed for their bilateral knee pain and osteoarthritis. They are now 5 month(s) out from last cortisone injections. Symptoms reported include: pain, swelling, aching, instability and difficulty ambulating (especially with steps). The patient feels that they are doing poorly and report their pain level to be moderate. The following medication has been used for pain control: antiinflammatory medication (Aleve PRN). The patient has reported symptom temporary improvement with Cortisone injections while they  have not gotten any relief of their symptoms with: viscosupplementation. She got advanced arthritic change in both knees with significant symptoms equal in both knees. She has had cortisone and visco supplements without much benefit. She is at a stage now where her function is impaired. For the first time in her adult life she is unable to ski. In addition, she is having a much more difficult time exercising and exercise is a big part of her life. Long-term the most predictable means of getting her better would be total knee arthroplasty. She is definitely at a stage where she is ready to do that. We discussed doing both at the same time and she is definitely young enough and healthy enough to do so. We discussed bilateral total knee arthroplasty in detail. She would like to go ahead and proceed with that. Risks and benefits were discussed and the she elects to proceed with surgery at this time.  ROS General; negative Cardio: negative Resp: negative GI: negative GU: negative Musculoskeletal: Musculoskeletal: Joint Pain.   Physical Exam Patient is a 72 year old female.  General Mental Status - Alert, cooperative and good historian. General Appearance - pleasant, Not in acute distress. Orientation - Oriented X3. Build & Nutrition - Well nourished and Well developed.  Head and Neck Head - normocephalic, atraumatic . Neck Global Assessment - supple, no bruit auscultated on the right, no bruit auscultated on the left.  Eye Wears glasses Pupil - Bilateral - PERR Motion - Bilateral - EOMI.  Chest and Lung Exam Auscultation Breath sounds - clear at anterior chest wall and clear at posterior chest wall. Adventitious sounds - No Adventitious sounds.  Cardiovascular Auscultation Rhythm - Regular rate and rhythm. Heart Sounds - S1 WNL and S2 WNL. Murmurs & Other  Heart Sounds - Auscultation of the heart reveals - No Murmurs.  Abdomen Palpation/Percussion Tenderness - Abdomen is non-tender  to palpation. Abdomen is soft. Auscultation Auscultation of the abdomen reveals - Bowel sounds normal.  Genitourinary Note: Not done, not pertinent to present illness  Musculoskeletal Both hips show normal range of motion with no discomfort. Her left knee shows a moderate sized effusion. Her range of motion is about 5 to 135. There is marked crepitus on range of motion. She does have some tenderness lateral greater than medial with no instability noted. Right knee, no effusion. Range 5 to 130. Marked crepitus in range of motion. Some tenderness medial and lateral. No instability.  RADIOGRAPHS Taken today, AP and lateral of both knees show severe patellofemoral arthritis on both sides with lateral marginal osteophytes on the left worse than right lateral compartment. She does have some lateral compartment narrowing on the left.    Assessment / Plan 1. Osteoarthritis of knee M17.0: Bilateral primary osteoarthritis of knee  Patient Instructions Surgical Plans: Bilateral Total Knee Replacements Disposition: CIR PCP: Dr. Inda Merlin IV TXA Anesthesia Issues: None Patient was instructed on what medications to stop prior to surgery. - Follow up visit in 2 weeks with Dr. Wynelle Link - Begin physical therapy following surgery - Pre-operative lab work as pre Pre-Surgical Testing - Prescriptions will be provided in hospital at time of discharge  Return to [place], MD for [place] at [facility] on 06/09/2017 at 03:45 PM  Encounter signed-off by Mickel Crow, PA-C

## 2017-05-24 NOTE — H&P (Signed)
Sherry [name], [place] [phone], F)  DOB Oct 05, 1945  Chief Complaint Bilateral Knee Pain   Vitals Ht: 5 ft 5 in  Wt: 151 lbs  BMI: 25.1 Pain Scale: 6 Pain Scale Type: Numeric BP - 124/68 Pulse - 84  Allergies Reviewed Allergies MASTISOL LIQUID ADHESIVE: Itching (Severe)  NKDA  Medications Reviewed Medications melatonin 05/12/17   entered Loura Back Miralax 05/12/17   entered [place] 05/12/17   entered Loura Back   Problems Reviewed Problems Osteoarthritis of knees   Family History Father - Arthritis Mother - Arthritis   Social History Smoking Status: Former smoker Non-smoker Tobacco-years of use: 3 Chewing tobacco: none Alcohol intake: Occasional Hand Dominance: Right Work related injury?: N Advance directive: Y Medical Power of Attorney: N   Surgical History Reviewed Surgical History Lumbar Laminectomy - 04/14/2012 Hand Surgery - 04/15/2011 LASIK - 04/15/2003 Arthroscopy - 04/14/1992 Knee Arthroscopy - 04/14/1992 Appendectomy - 04/14/1965 Adenoid Surgery - 04/14/1950 Tonsillectomy - 04/14/1950   GYN History Most Recent Mammogram: 01/26/2017.   Past Medical History Reviewed Past Medical History Joint Pain: Y Osteoarthritis: Y Constipative IBS GERD   HPI Patient is a 72 year old female scheduled for bilateral total knee arthroplasty by Dr. Wynelle Link on 05/27/2017 at [facility]. The patient is a 72 year old female who is being followed for their bilateral knee pain and osteoarthritis. They are now 5 month(s) out from last cortisone injections. Symptoms reported include: pain, swelling, aching, instability and difficulty ambulating (especially with steps). The patient feels that they are doing poorly and report their pain level to be moderate. The following medication has been used for pain control: antiinflammatory medication (Aleve PRN). The patient has reported symptom temporary improvement with Cortisone injections while they  have not gotten any relief of their symptoms with: viscosupplementation. She got advanced arthritic change in both knees with significant symptoms equal in both knees. She has had cortisone and visco supplements without much benefit. She is at a stage now where her function is impaired. For the first time in her adult life she is unable to ski. In addition, she is having a much more difficult time exercising and exercise is a big part of her life. Long-term the most predictable means of getting her better would be total knee arthroplasty. She is definitely at a stage where she is ready to do that. We discussed doing both at the same time and she is definitely young enough and healthy enough to do so. We discussed bilateral total knee arthroplasty in detail. She would like to go ahead and proceed with that. Risks and benefits were discussed and the she elects to proceed with surgery at this time.  ROS General; negative Cardio: negative Resp: negative GI: negative GU: negative Musculoskeletal: Musculoskeletal: Joint Pain.   Physical Exam Patient is a 72 year old female.  General Mental Status - Alert, cooperative and good historian. General Appearance - pleasant, Not in acute distress. Orientation - Oriented X3. Build & Nutrition - Well nourished and Well developed.  Head and Neck Head - normocephalic, atraumatic . Neck Global Assessment - supple, no bruit auscultated on the right, no bruit auscultated on the left.  Eye Wears glasses Pupil - Bilateral - PERR Motion - Bilateral - EOMI.  Chest and Lung Exam Auscultation Breath sounds - clear at anterior chest wall and clear at posterior chest wall. Adventitious sounds - No Adventitious sounds.  Cardiovascular Auscultation Rhythm - Regular rate and rhythm. Heart Sounds - S1 WNL and S2 WNL. Murmurs & Other  Heart Sounds - Auscultation of the heart reveals - No Murmurs.  Abdomen Palpation/Percussion Tenderness - Abdomen is non-tender  to palpation. Abdomen is soft. Auscultation Auscultation of the abdomen reveals - Bowel sounds normal.  Genitourinary Note: Not done, not pertinent to present illness  Musculoskeletal Both hips show normal range of motion with no discomfort. Her left knee shows a moderate sized effusion. Her range of motion is about 5 to 135. There is marked crepitus on range of motion. She does have some tenderness lateral greater than medial with no instability noted. Right knee, no effusion. Range 5 to 130. Marked crepitus in range of motion. Some tenderness medial and lateral. No instability.  RADIOGRAPHS Taken today, AP and lateral of both knees show severe patellofemoral arthritis on both sides with lateral marginal osteophytes on the left worse than right lateral compartment. She does have some lateral compartment narrowing on the left.    Assessment / Plan 1. Osteoarthritis of knee M17.0: Bilateral primary osteoarthritis of knee  Patient Instructions Surgical Plans: Bilateral Total Knee Replacements Disposition: CIR PCP: Dr. Inda Merlin IV TXA Anesthesia Issues: None Patient was instructed on what medications to stop prior to surgery. - Follow up visit in 2 weeks with Dr. Wynelle Link - Begin physical therapy following surgery - Pre-operative lab work as pre Pre-Surgical Testing - Prescriptions will be provided in hospital at time of discharge  Return to [place], MD for [place] at [facility] on 06/09/2017 at 03:45 PM  Encounter signed-off by Mickel Crow, PA-C

## 2017-05-26 MED ORDER — SODIUM CHLORIDE 0.9 % IV SOLN
1000.0000 mg | INTRAVENOUS | Status: AC
  Administered 2017-05-27: 1000 mg via INTRAVENOUS
  Filled 2017-05-26: qty 1100

## 2017-05-26 NOTE — Anesthesia Preprocedure Evaluation (Addendum)
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  Reproductive/Obstetrics                            Anesthesia Physical Anesthesia Plan  ASA: II  Anesthesia Plan: Combined Spinal and Epidural   Post-op Pain Management:    Induction:   PONV Risk Score and Plan: 2 and Propofol infusion, Dexamethasone, Ondansetron and Midazolam  Airway Management Planned: Simple Face Mask and Natural Airway  Additional Equipment:   Intra-op Plan:   Post-operative Plan:   Informed Consent: I have reviewed the patients History and Physical, chart, labs and discussed the procedure including the risks, benefits and alternatives for the proposed anesthesia with the patient or authorized representative who has indicated his/her understanding and acceptance.   Dental advisory given  Plan Discussed with: CRNA  Anesthesia Plan Comments: (Attempt CSE in OR, if unable due to previous PLIF, will proceed with GA with ETT.)       Anesthesia Quick  Evaluation

## 2017-05-27 ENCOUNTER — Inpatient Hospital Stay (HOSPITAL_COMMUNITY): Payer: Medicare Other | Admitting: Anesthesiology

## 2017-05-27 ENCOUNTER — Inpatient Hospital Stay (HOSPITAL_COMMUNITY): Payer: Medicare Other | Admitting: Certified Registered Nurse Anesthetist

## 2017-05-27 ENCOUNTER — Encounter (HOSPITAL_COMMUNITY)
Admission: RE | Disposition: A | Discharge status: Home / Self Care | Payer: Self-pay | Source: Ambulatory Visit | Attending: Orthopedic Surgery

## 2017-05-27 ENCOUNTER — Inpatient Hospital Stay (HOSPITAL_COMMUNITY)
Admission: RE | Admit: 2017-05-27 | Discharge: 2017-06-02 | DRG: 462 | Disposition: A | Discharge status: Home / Self Care | Payer: Medicare Other | Source: Ambulatory Visit | Attending: Orthopedic Surgery | Admitting: Orthopedic Surgery

## 2017-05-27 ENCOUNTER — Other Ambulatory Visit: Payer: Self-pay

## 2017-05-27 ENCOUNTER — Encounter (HOSPITAL_COMMUNITY): Payer: Self-pay

## 2017-05-27 DIAGNOSIS — Z96653 Presence of artificial knee joint, bilateral: Secondary | ICD-10-CM | POA: Diagnosis not present

## 2017-05-27 DIAGNOSIS — K5903 Drug induced constipation: Secondary | ICD-10-CM | POA: Diagnosis not present

## 2017-05-27 DIAGNOSIS — Z8261 Family history of arthritis: Secondary | ICD-10-CM | POA: Diagnosis not present

## 2017-05-27 DIAGNOSIS — Z803 Family history of malignant neoplasm of breast: Secondary | ICD-10-CM | POA: Diagnosis not present

## 2017-05-27 DIAGNOSIS — Z981 Arthrodesis status: Secondary | ICD-10-CM

## 2017-05-27 DIAGNOSIS — K5909 Other constipation: Secondary | ICD-10-CM | POA: Diagnosis not present

## 2017-05-27 DIAGNOSIS — Z9089 Acquired absence of other organs: Secondary | ICD-10-CM | POA: Diagnosis not present

## 2017-05-27 DIAGNOSIS — D62 Acute posthemorrhagic anemia: Secondary | ICD-10-CM | POA: Diagnosis not present

## 2017-05-27 DIAGNOSIS — Z841 Family history of disorders of kidney and ureter: Secondary | ICD-10-CM

## 2017-05-27 DIAGNOSIS — K589 Irritable bowel syndrome without diarrhea: Secondary | ICD-10-CM | POA: Diagnosis present

## 2017-05-27 DIAGNOSIS — Z820 Family history of epilepsy and other diseases of the nervous system: Secondary | ICD-10-CM

## 2017-05-27 DIAGNOSIS — F411 Generalized anxiety disorder: Secondary | ICD-10-CM | POA: Diagnosis not present

## 2017-05-27 DIAGNOSIS — R791 Abnormal coagulation profile: Secondary | ICD-10-CM | POA: Diagnosis not present

## 2017-05-27 DIAGNOSIS — K219 Gastro-esophageal reflux disease without esophagitis: Secondary | ICD-10-CM | POA: Diagnosis present

## 2017-05-27 DIAGNOSIS — G8918 Other acute postprocedural pain: Secondary | ICD-10-CM | POA: Diagnosis not present

## 2017-05-27 DIAGNOSIS — R000 Tachycardia, unspecified: Secondary | ICD-10-CM | POA: Diagnosis not present

## 2017-05-27 DIAGNOSIS — Z7901 Long term (current) use of anticoagulants: Secondary | ICD-10-CM | POA: Diagnosis not present

## 2017-05-27 DIAGNOSIS — Z91048 Other nonmedicinal substance allergy status: Secondary | ICD-10-CM | POA: Diagnosis not present

## 2017-05-27 DIAGNOSIS — Z87891 Personal history of nicotine dependence: Secondary | ICD-10-CM | POA: Diagnosis not present

## 2017-05-27 DIAGNOSIS — F329 Major depressive disorder, single episode, unspecified: Secondary | ICD-10-CM | POA: Diagnosis present

## 2017-05-27 DIAGNOSIS — Z7982 Long term (current) use of aspirin: Secondary | ICD-10-CM | POA: Diagnosis not present

## 2017-05-27 DIAGNOSIS — Z806 Family history of leukemia: Secondary | ICD-10-CM

## 2017-05-27 DIAGNOSIS — Z800 Family history of malignant neoplasm of digestive organs: Secondary | ICD-10-CM

## 2017-05-27 DIAGNOSIS — M170 Bilateral primary osteoarthritis of knee: Principal | ICD-10-CM | POA: Diagnosis present

## 2017-05-27 DIAGNOSIS — Z79899 Other long term (current) drug therapy: Secondary | ICD-10-CM

## 2017-05-27 DIAGNOSIS — M1710 Unilateral primary osteoarthritis, unspecified knee: Secondary | ICD-10-CM | POA: Diagnosis present

## 2017-05-27 DIAGNOSIS — F419 Anxiety disorder, unspecified: Secondary | ICD-10-CM | POA: Diagnosis present

## 2017-05-27 DIAGNOSIS — M179 Osteoarthritis of knee, unspecified: Secondary | ICD-10-CM

## 2017-05-27 DIAGNOSIS — E46 Unspecified protein-calorie malnutrition: Secondary | ICD-10-CM | POA: Diagnosis not present

## 2017-05-27 DIAGNOSIS — G479 Sleep disorder, unspecified: Secondary | ICD-10-CM | POA: Diagnosis not present

## 2017-05-27 DIAGNOSIS — M7989 Other specified soft tissue disorders: Secondary | ICD-10-CM | POA: Diagnosis not present

## 2017-05-27 DIAGNOSIS — Z471 Aftercare following joint replacement surgery: Secondary | ICD-10-CM | POA: Diagnosis not present

## 2017-05-27 HISTORY — PX: TOTAL KNEE ARTHROPLASTY: SHX125

## 2017-05-27 LAB — TYPE AND SCREEN
ABO/RH(D): O NEG
Antibody Screen: NEGATIVE

## 2017-05-27 SURGERY — ARTHROPLASTY, KNEE, BILATERAL, TOTAL
Anesthesia: Spinal | Site: Knee | Laterality: Bilateral

## 2017-05-27 MED ORDER — DEXAMETHASONE SODIUM PHOSPHATE 10 MG/ML IJ SOLN
10.0000 mg | Freq: Once | INTRAMUSCULAR | Status: AC
  Administered 2017-05-28: 10 mg via INTRAVENOUS
  Filled 2017-05-27: qty 1

## 2017-05-27 MED ORDER — FENTANYL CITRATE (PF) 100 MCG/2ML IJ SOLN
INTRAMUSCULAR | Status: AC
  Filled 2017-05-27: qty 2

## 2017-05-27 MED ORDER — MIDAZOLAM HCL 5 MG/5ML IJ SOLN
INTRAMUSCULAR | Status: DC | PRN
  Administered 2017-05-27: 2 mg via INTRAVENOUS

## 2017-05-27 MED ORDER — WARFARIN SODIUM 2.5 MG PO TABS
2.5000 mg | ORAL_TABLET | Freq: Once | ORAL | Status: AC
  Administered 2017-05-28: 2.5 mg via ORAL
  Filled 2017-05-27: qty 1

## 2017-05-27 MED ORDER — ACETAMINOPHEN 10 MG/ML IV SOLN
1000.0000 mg | Freq: Once | INTRAVENOUS | Status: AC
  Administered 2017-05-27: 1000 mg via INTRAVENOUS

## 2017-05-27 MED ORDER — WARFARIN SODIUM 2.5 MG PO TABS: 2.5000 mg | ORAL_TABLET | Freq: Once | ORAL | Status: DC

## 2017-05-27 MED ORDER — CHLORHEXIDINE GLUCONATE 4 % EX LIQD: 60.0000 mL | Freq: Once | CUTANEOUS | Status: DC

## 2017-05-27 MED ORDER — PROPOFOL 10 MG/ML IV BOLUS
INTRAVENOUS | Status: DC | PRN
  Administered 2017-05-27 (×2): 10 mg via INTRAVENOUS

## 2017-05-27 MED ORDER — LORATADINE 10 MG PO TABS: 10.0000 mg | ORAL_TABLET | Freq: Every day | ORAL | Status: DC | PRN

## 2017-05-27 MED ORDER — POLYETHYLENE GLYCOL 3350 17 G PO PACK
17.0000 g | PACK | Freq: Every day | ORAL | Status: DC | PRN
  Administered 2017-05-29 – 2017-06-02 (×2): 17 g via ORAL
  Filled 2017-05-27 (×2): qty 1

## 2017-05-27 MED ORDER — WARFARIN - PHARMACIST DOSING INPATIENT: Freq: Every day | Status: DC

## 2017-05-27 MED ORDER — SODIUM CHLORIDE 0.9 % IR SOLN
Status: DC | PRN
  Administered 2017-05-27 (×2): 1000 mL

## 2017-05-27 MED ORDER — PHENOL 1.4 % MT LIQD: 1.0000 | OROMUCOSAL | Status: DC | PRN

## 2017-05-27 MED ORDER — GABAPENTIN 300 MG PO CAPS
300.0000 mg | ORAL_CAPSULE | Freq: Three times a day (TID) | ORAL | Status: DC
  Administered 2017-05-27 – 2017-06-02 (×17): 300 mg via ORAL
  Filled 2017-05-27 (×18): qty 1

## 2017-05-27 MED ORDER — SODIUM CHLORIDE 0.9 % IV SOLN
INTRAVENOUS | Status: DC
  Administered 2017-05-27 – 2017-05-29 (×4): via INTRAVENOUS

## 2017-05-27 MED ORDER — TRANEXAMIC ACID 1000 MG/10ML IV SOLN
1000.0000 mg | Freq: Once | INTRAVENOUS | Status: AC
  Administered 2017-05-27: 1000 mg via INTRAVENOUS
  Filled 2017-05-27: qty 1100

## 2017-05-27 MED ORDER — DIPHENHYDRAMINE HCL 12.5 MG/5ML PO ELIX: 12.5000 mg | ORAL_SOLUTION | ORAL | Status: DC | PRN

## 2017-05-27 MED ORDER — DEXAMETHASONE SODIUM PHOSPHATE 10 MG/ML IJ SOLN
INTRAMUSCULAR | Status: AC
  Filled 2017-05-27: qty 1

## 2017-05-27 MED ORDER — ACETAMINOPHEN 500 MG PO TABS
1000.0000 mg | ORAL_TABLET | Freq: Four times a day (QID) | ORAL | Status: AC
  Administered 2017-05-27 – 2017-05-28 (×3): 1000 mg via ORAL
  Filled 2017-05-27 (×4): qty 2

## 2017-05-27 MED ORDER — BISACODYL 10 MG RE SUPP: 10.0000 mg | Freq: Every day | RECTAL | Status: DC | PRN

## 2017-05-27 MED ORDER — OXYCODONE HCL 5 MG PO TABS
10.0000 mg | ORAL_TABLET | ORAL | Status: DC | PRN
  Administered 2017-05-28 – 2017-05-29 (×5): 10 mg via ORAL
  Filled 2017-05-27 (×5): qty 2

## 2017-05-27 MED ORDER — STERILE WATER FOR IRRIGATION IR SOLN
Status: DC | PRN
  Administered 2017-05-27: 2000 mL

## 2017-05-27 MED ORDER — FENTANYL CITRATE (PF) 100 MCG/2ML IJ SOLN: 50.0000 ug | INTRAMUSCULAR | Status: DC | PRN

## 2017-05-27 MED ORDER — LACTATED RINGERS IV SOLN
INTRAVENOUS | Status: DC
  Administered 2017-05-27: 1000 mL via INTRAVENOUS
  Administered 2017-05-27: 10:00:00 via INTRAVENOUS

## 2017-05-27 MED ORDER — FAMOTIDINE 20 MG PO TABS
20.0000 mg | ORAL_TABLET | ORAL | Status: DC | PRN
  Administered 2017-05-27 – 2017-05-29 (×2): 20 mg via ORAL
  Filled 2017-05-27 (×3): qty 1

## 2017-05-27 MED ORDER — ONDANSETRON HCL 4 MG/2ML IJ SOLN
INTRAMUSCULAR | Status: DC | PRN
  Administered 2017-05-27: 4 mg via INTRAVENOUS

## 2017-05-27 MED ORDER — ACETAMINOPHEN 325 MG PO TABS
650.0000 mg | ORAL_TABLET | ORAL | Status: DC | PRN
  Administered 2017-05-29 – 2017-06-01 (×5): 650 mg via ORAL
  Filled 2017-05-27 (×5): qty 2

## 2017-05-27 MED ORDER — PANTOPRAZOLE SODIUM 40 MG PO TBEC: 40.0000 mg | DELAYED_RELEASE_TABLET | Freq: Every day | ORAL | Status: DC | PRN

## 2017-05-27 MED ORDER — PROPOFOL 10 MG/ML IV BOLUS
INTRAVENOUS | Status: AC
  Filled 2017-05-27: qty 60

## 2017-05-27 MED ORDER — OXYCODONE HCL 5 MG PO TABS
5.0000 mg | ORAL_TABLET | ORAL | Status: DC | PRN
  Administered 2017-05-28 – 2017-06-02 (×5): 5 mg via ORAL
  Filled 2017-05-27 (×4): qty 1

## 2017-05-27 MED ORDER — ONDANSETRON HCL 4 MG/2ML IJ SOLN
4.0000 mg | Freq: Four times a day (QID) | INTRAMUSCULAR | Status: DC | PRN
Start: 2017-05-27 — End: 2017-06-02

## 2017-05-27 MED ORDER — FLEET ENEMA 7-19 GM/118ML RE ENEM: 1.0000 | ENEMA | Freq: Once | RECTAL | Status: DC | PRN

## 2017-05-27 MED ORDER — METOCLOPRAMIDE HCL 5 MG/ML IJ SOLN: 5.0000 mg | Freq: Three times a day (TID) | INTRAMUSCULAR | Status: DC | PRN

## 2017-05-27 MED ORDER — WARFARIN VIDEO
Freq: Once | Status: AC
  Administered 2017-05-28: 12:00:00

## 2017-05-27 MED ORDER — CEFAZOLIN SODIUM-DEXTROSE 2-4 GM/100ML-% IV SOLN
2.0000 g | Freq: Four times a day (QID) | INTRAVENOUS | Status: AC
  Administered 2017-05-27 (×2): 2 g via INTRAVENOUS
  Filled 2017-05-27 (×2): qty 100

## 2017-05-27 MED ORDER — DEXAMETHASONE SODIUM PHOSPHATE 10 MG/ML IJ SOLN
10.0000 mg | Freq: Once | INTRAMUSCULAR | Status: AC
  Administered 2017-05-27: 10 mg via INTRAVENOUS

## 2017-05-27 MED ORDER — ROPIVACAINE HCL 2 MG/ML IJ SOLN
1.0000 mL/h | INTRAMUSCULAR | Status: AC
  Administered 2017-05-27: 1 mL/h via INTRATHECAL
  Filled 2017-05-27: qty 200

## 2017-05-27 MED ORDER — MORPHINE SULFATE (PF) 2 MG/ML IV SOLN
1.0000 mg | INTRAVENOUS | Status: DC | PRN
  Administered 2017-05-28 – 2017-05-29 (×4): 1 mg via INTRAVENOUS
  Filled 2017-05-27 (×5): qty 1

## 2017-05-27 MED ORDER — MIDAZOLAM HCL 2 MG/2ML IJ SOLN: 1.0000 mg | INTRAMUSCULAR | Status: DC | PRN

## 2017-05-27 MED ORDER — CEFAZOLIN SODIUM-DEXTROSE 2-4 GM/100ML-% IV SOLN
2.0000 g | INTRAVENOUS | Status: AC
  Administered 2017-05-27: 2 g via INTRAVENOUS
  Filled 2017-05-27: qty 100

## 2017-05-27 MED ORDER — ONDANSETRON HCL 4 MG/2ML IJ SOLN
INTRAMUSCULAR | Status: AC
  Filled 2017-05-27: qty 2

## 2017-05-27 MED ORDER — NALOXEGOL OXALATE 25 MG PO TABS
25.0000 mg | ORAL_TABLET | Freq: Every day | ORAL | Status: DC
  Administered 2017-05-28 – 2017-06-02 (×6): 25 mg via ORAL
  Filled 2017-05-27 (×6): qty 1

## 2017-05-27 MED ORDER — MIDAZOLAM HCL 2 MG/2ML IJ SOLN
INTRAMUSCULAR | Status: AC
  Filled 2017-05-27: qty 2

## 2017-05-27 MED ORDER — SODIUM CHLORIDE 0.9 % IR SOLN
Status: DC | PRN
  Administered 2017-05-27: 1000 mL

## 2017-05-27 MED ORDER — ACETAMINOPHEN 650 MG RE SUPP
650.0000 mg | RECTAL | Status: DC | PRN
Start: 2017-05-28 — End: 2017-06-02

## 2017-05-27 MED ORDER — PROPOFOL 500 MG/50ML IV EMUL
INTRAVENOUS | Status: DC | PRN
  Administered 2017-05-27: 50 ug/kg/min via INTRAVENOUS

## 2017-05-27 MED ORDER — COUMADIN BOOK
Freq: Once | Status: AC
  Administered 2017-05-28: 1
  Filled 2017-05-27: qty 1

## 2017-05-27 MED ORDER — GABAPENTIN 300 MG PO CAPS
ORAL_CAPSULE | ORAL | Status: AC
  Filled 2017-05-27: qty 1

## 2017-05-27 MED ORDER — METHOCARBAMOL 500 MG PO TABS
500.0000 mg | ORAL_TABLET | Freq: Four times a day (QID) | ORAL | Status: DC | PRN
  Administered 2017-05-28 – 2017-05-29 (×2): 500 mg via ORAL
  Filled 2017-05-27 (×2): qty 1

## 2017-05-27 MED ORDER — ONDANSETRON HCL 4 MG/2ML IJ SOLN: 4.0000 mg | Freq: Once | INTRAMUSCULAR | Status: DC | PRN

## 2017-05-27 MED ORDER — FENTANYL CITRATE (PF) 100 MCG/2ML IJ SOLN
INTRAMUSCULAR | Status: DC | PRN
  Administered 2017-05-27: 50 ug via INTRAVENOUS

## 2017-05-27 MED ORDER — BUPIVACAINE IN DEXTROSE 0.75-8.25 % IT SOLN
INTRATHECAL | Status: DC | PRN
  Administered 2017-05-27: 2 mL via INTRATHECAL

## 2017-05-27 MED ORDER — DICYCLOMINE HCL 10 MG PO CAPS
10.0000 mg | ORAL_CAPSULE | Freq: Three times a day (TID) | ORAL | Status: DC | PRN
  Administered 2017-05-28: 10 mg via ORAL
  Filled 2017-05-27: qty 1

## 2017-05-27 MED ORDER — POLYETHYLENE GLYCOL 3350 17 G PO PACK
17.0000 g | PACK | ORAL | Status: DC
  Administered 2017-05-28 – 2017-06-01 (×4): 17 g via ORAL
  Filled 2017-05-27 (×4): qty 1

## 2017-05-27 MED ORDER — ACETAMINOPHEN 10 MG/ML IV SOLN
INTRAVENOUS | Status: AC
  Filled 2017-05-27: qty 100

## 2017-05-27 MED ORDER — FENTANYL CITRATE (PF) 100 MCG/2ML IJ SOLN
25.0000 ug | INTRAMUSCULAR | Status: DC | PRN
  Administered 2017-05-27: 25 ug via INTRAVENOUS
  Administered 2017-05-27 (×2): 50 ug via INTRAVENOUS

## 2017-05-27 MED ORDER — METHOCARBAMOL 1000 MG/10ML IJ SOLN
500.0000 mg | Freq: Four times a day (QID) | INTRAVENOUS | Status: DC | PRN
  Administered 2017-05-27: 500 mg via INTRAVENOUS
  Filled 2017-05-27: qty 550

## 2017-05-27 MED ORDER — DOCUSATE SODIUM 100 MG PO CAPS
100.0000 mg | ORAL_CAPSULE | Freq: Two times a day (BID) | ORAL | Status: DC
  Administered 2017-05-27 – 2017-06-02 (×12): 100 mg via ORAL
  Filled 2017-05-27 (×12): qty 1

## 2017-05-27 MED ORDER — GABAPENTIN 300 MG PO CAPS
300.0000 mg | ORAL_CAPSULE | Freq: Once | ORAL | Status: AC
  Administered 2017-05-27: 300 mg via ORAL

## 2017-05-27 MED ORDER — ONDANSETRON HCL 4 MG PO TABS
4.0000 mg | ORAL_TABLET | Freq: Four times a day (QID) | ORAL | Status: DC | PRN
Start: 2017-05-27 — End: 2017-06-02

## 2017-05-27 MED ORDER — MENTHOL 3 MG MT LOZG: 1.0000 | LOZENGE | OROMUCOSAL | Status: DC | PRN

## 2017-05-27 MED ORDER — METOCLOPRAMIDE HCL 5 MG PO TABS: 5.0000 mg | ORAL_TABLET | Freq: Three times a day (TID) | ORAL | Status: DC | PRN

## 2017-05-27 SURGICAL SUPPLY — 54 items
BAG ZIPLOCK 12X15 (MISCELLANEOUS) ×6 IMPLANT
BANDAGE ACE 6X5 VEL STRL LF (GAUZE/BANDAGES/DRESSINGS) ×6 IMPLANT
BANDAGE ESMARK 6X9 LF (GAUZE/BANDAGES/DRESSINGS) ×1 IMPLANT
BLADE SAG 18X100X1.27 (BLADE) ×6 IMPLANT
BLADE SAW SGTL 11.0X1.19X90.0M (BLADE) ×6 IMPLANT
BLADE SURG SZ10 CARB STEEL (BLADE) ×6 IMPLANT
BNDG COHESIVE 6X5 TAN STRL LF (GAUZE/BANDAGES/DRESSINGS) ×3 IMPLANT
BNDG ESMARK 6X9 LF (GAUZE/BANDAGES/DRESSINGS) ×3
BOWL SMART MIX CTS (DISPOSABLE) ×6 IMPLANT
CAPT KNEE TOTAL 3 ATTUNE ×6 IMPLANT
CEMENT HV SMART SET (Cement) ×12 IMPLANT
CLOSURE WOUND 1/2 X4 (GAUZE/BANDAGES/DRESSINGS) ×4
COVER SURGICAL LIGHT HANDLE (MISCELLANEOUS) ×3 IMPLANT
CUFF TOURN SGL QUICK 34 (TOURNIQUET CUFF) ×4
CUFF TRNQT CYL 34X4X40X1 (TOURNIQUET CUFF) ×2 IMPLANT
DRAPE EXTREMITY BILATERAL (DRAPES) ×3 IMPLANT
DRAPE INCISE IOBAN 66X45 STRL (DRAPES) ×3 IMPLANT
DRAPE POUCH INSTRU U-SHP 10X18 (DRAPES) ×3 IMPLANT
DRAPE U-SHAPE 47X51 STRL (DRAPES) ×9 IMPLANT
DRSG ADAPTIC 3X8 NADH LF (GAUZE/BANDAGES/DRESSINGS) ×6 IMPLANT
DRSG PAD ABDOMINAL 8X10 ST (GAUZE/BANDAGES/DRESSINGS) ×6 IMPLANT
DURAPREP 26ML APPLICATOR (WOUND CARE) ×6 IMPLANT
ELECT REM PT RETURN 15FT ADLT (MISCELLANEOUS) ×3 IMPLANT
EVACUATOR 1/8 PVC DRAIN (DRAIN) ×6 IMPLANT
FACESHIELD WRAPAROUND (MASK) ×9 IMPLANT
GAUZE SPONGE 4X4 12PLY STRL (GAUZE/BANDAGES/DRESSINGS) ×6 IMPLANT
GLOVE BIO SURGEON STRL SZ7.5 (GLOVE) ×6 IMPLANT
GLOVE BIO SURGEON STRL SZ8 (GLOVE) ×6 IMPLANT
GLOVE BIOGEL PI IND STRL 8 (GLOVE) ×2 IMPLANT
GLOVE BIOGEL PI INDICATOR 8 (GLOVE) ×4
GOWN STRL REUS W/TWL LRG LVL3 (GOWN DISPOSABLE) ×9 IMPLANT
GOWN STRL REUS W/TWL XL LVL3 (GOWN DISPOSABLE) ×6 IMPLANT
HANDPIECE INTERPULSE COAX TIP (DISPOSABLE) ×2
IMMOBILIZER KNEE 20 (SOFTGOODS) ×6
IMMOBILIZER KNEE 20 THIGH 36 (SOFTGOODS) ×2 IMPLANT
MANIFOLD NEPTUNE II (INSTRUMENTS) ×3 IMPLANT
NS IRRIG 1000ML POUR BTL (IV SOLUTION) ×3 IMPLANT
PACK TOTAL KNEE CUSTOM (KITS) ×3 IMPLANT
PADDING CAST COTTON 6X4 STRL (CAST SUPPLIES) ×6 IMPLANT
SET HNDPC FAN SPRY TIP SCT (DISPOSABLE) ×1 IMPLANT
SPONGE LAP 18X18 X RAY DECT (DISPOSABLE) IMPLANT
STOCKINETTE 8 INCH (MISCELLANEOUS) ×3 IMPLANT
STRIP CLOSURE SKIN 1/2X4 (GAUZE/BANDAGES/DRESSINGS) ×8 IMPLANT
SUCTION FRAZIER HANDLE 12FR (TUBING) ×2
SUCTION TUBE FRAZIER 12FR DISP (TUBING) ×1 IMPLANT
SUT MNCRL AB 4-0 PS2 18 (SUTURE) ×6 IMPLANT
SUT STRATAFIX 0 PDS 27 VIOLET (SUTURE) ×6
SUT VIC AB 2-0 CT1 27 (SUTURE) ×12
SUT VIC AB 2-0 CT1 TAPERPNT 27 (SUTURE) ×6 IMPLANT
SUTURE STRATFX 0 PDS 27 VIOLET (SUTURE) ×2 IMPLANT
SYR 50ML LL SCALE MARK (SYRINGE) IMPLANT
WATER STERILE IRR 1000ML POUR (IV SOLUTION) ×6 IMPLANT
WRAP KNEE MAXI GEL POST OP (GAUZE/BANDAGES/DRESSINGS) ×6 IMPLANT
YANKAUER SUCT BULB TIP 10FT TU (MISCELLANEOUS) ×3 IMPLANT

## 2017-05-27 NOTE — Op Note (Signed)
Pre-operative diagnosis- Osteoarthritis  Bilateral knee(s)  Post-operative diagnosis- Osteoarthritis Bilateral knee(s)  Procedure-  Bilateral  Total Knee Arthroplasty  Surgeon- Dione Plover. Laymond Postle, MD  Assistant- Arlee Muslim, PA-C   Anesthesia-  Spinal with intrathecal catheter  EBL-100 mL   Drains Hemovac x 1 each side  Tourniquet time-  Total Tourniquet Time Documented: Thigh (Left) - 32 minutes Total: Thigh (Left) - 32 minutes  Thigh (Right) - 31 minutes Total: Thigh (Right) - 31 minutes     Complications- None  Condition-PACU - hemodynamically stable.   Brief Clinical Note  Sherry [name] is a 72 y.o. year old female with end stage OA of both knees with progressively worsening pain and dysfunction. The patient has constant pain, with activity and at rest and significant functional deficits with difficulties even with ADLs. The patient has had extensive non-op management including analgesics, injections of cortisone and viscosupplements, and home exercise program, but remains in significant pain with significant dysfunction. We discussed replacing both knees in the same setting versus one at a time including procedure, risks, potential complications, rehab course, and pros and cons associated with each and the patient elects to do both knees at the same time. The patient presents now for bilateral Total Knee Arthroplasty.     Procedure in detail---   The patient is brought into the operating room and positioned supine on the operating table. After successful administration of  Spinal and intrathecal catheter,  a tourniquet is placed high on the  Bilateral thigh(s) and the lower extremities are prepped and draped in the usual sterile fashion. Time out is performed by the operating team and then the  Right lower extremity is wrapped in Esmarch, knee flexed and the tourniquet inflated to 300 mmHg.       A midline incision is made with a ten blade through the subcutaneous tissue to  the level of the extensor mechanism. A fresh blade is used to make a medial parapatellar arthrotomy. Soft tissue over the proximal medial tibia is subperiosteally elevated to the joint line with a knife and into the semimembranosus bursa with a Cobb elevator. Soft tissue over the proximal lateral tibia is elevated with attention being paid to avoiding the patellar tendon on the tibial tubercle. The patella is everted, knee flexed 90 degrees and the ACL and PCL are removed. Findings are bone on bone patellofemoral with large global osteophytes.        The drill is used to create a starting hole in the distal femur and the canal is thoroughly irrigated with sterile saline to remove the fatty contents. The 5 degree Right  valgus alignment guide is placed into the femoral canal and the distal femoral cutting block is pinned to remove 10 mm off the distal femur. Resection is made with an oscillating saw.      The tibia is subluxed forward and the menisci are removed. The extramedullary alignment guide is placed referencing proximally at the medial aspect of the tibial tubercle and distally along the second metatarsal axis and tibial crest. The block is pinned to remove 29mm off the more deficient medial  side. Resection is made with an oscillating saw. Size 4is the most appropriate size for the tibia and the proximal tibia is prepared with the modular drill and keel punch for that size.      The femoral sizing guide is placed and size 5 is most appropriate. Rotation is marked off the epicondylar axis and confirmed by creating a rectangular flexion  gap at 90 degrees. The size 5 cutting block is pinned in this rotation and the anterior, posterior and chamfer cuts are made with the oscillating saw. The intercondylar block is then placed and that cut is made.      Trial size 4 tibial component, trial size 5 posterior stabilized femur and a 6  mm posterior stabilized rotating platform insert trial is placed. Full extension  is achieved with excellent varus/valgus and anterior/posterior balance throughout full range of motion. The patella is everted and thickness measured to be 20  mm. Free hand resection is taken to 11 mm, a 35 template is placed, lug holes are drilled, trial patella is placed, and it tracks normally. Osteophytes are removed off the posterior femur with the trial in place. All trials are removed and the cut bone surfaces prepared with pulsatile lavage. Cement is mixed and once ready for implantation, the size 4 tibial implant, size  5 posterior stabilized femoral component, and the size 35 patella are cemented in place and the patella is held with the clamp. The trial insert is placed and the knee held in full extension.  All extruded cement is removed and once the cement is hard the permanent 6 mm posterior stabilized rotating platform insert is placed into the tibial tray.      The wound is copiously irrigated with saline solution and the extensor mechanism closed over a hemovac drain with #1 V-loc suture. The tourniquet is released for a total tourniquet time of 30  minutes. Flexion against gravity is 140 degrees and the patella tracks normally. Subcutaneous tissue is closed with 2.0 vicryl and subcuticular with running 4.0 Monocryl.       The  Left lower extremity is wrapped in Esmarch, knee flexed and the tourniquet inflated to 300 mmHg.       A midline incision is made with a ten blade through the subcutaneous tissue to the level of the extensor mechanism. A fresh blade is used to make a medial parapatellar arthrotomy. Soft tissue over the proximal medial tibia is subperiosteally elevated to the joint line with a knife and into the semimembranosus bursa with a Cobb elevator. Soft tissue over the proximal lateral tibia is elevated with attention being paid to avoiding the patellar tendon on the tibial tubercle. The patella is everted, knee flexed 90 degrees and the ACL and PCL are removed. Findings are bone on  bone patellofemoral with large global osteophytes.        The drill is used to create a starting hole in the distal femur and the canal is thoroughly irrigated with sterile saline to remove the fatty contents. The 5 degree Left  valgus alignment guide is placed into the femoral canal and the distal femoral cutting block is pinned to remove 10 mm off the distal femur. Resection is made with an oscillating saw.      The tibia is subluxed forward and the menisci are removed. The extramedullary alignment guide is placed referencing proximally at the medial aspect of the tibial tubercle and distally along the second metatarsal axis and tibial crest. The block is pinned to remove 24mm off the more deficient medial  side. Resection is made with an oscillating saw. Size 4is the most appropriate size for the tibia and the proximal tibia is prepared with the modular drill and keel punch for that size.      The femoral sizing guide is placed and size 5 is most appropriate. Rotation is marked off the epicondylar  axis and confirmed by creating a rectangular flexion gap at 90 degrees. The size 5 cutting block is pinned in this rotation and the anterior, posterior and chamfer cuts are made with the oscillating saw. The intercondylar block is then placed and that cut is made.      Trial size 4 tibial component, trial size 5 posterior stabilized femur and a 6  mm posterior stabilized rotating platform insert trial is placed. Full extension is achieved with excellent varus/valgus and anterior/posterior balance throughout full range of motion. The patella is everted and thickness measured to be 20  mm. Free hand resection is taken to 12 mm, a 35 template is placed, lug holes are drilled, trial patella is placed, and it tracks normally. Osteophytes are removed off the posterior femur with the trial in place. All trials are removed and the cut bone surfaces prepared with pulsatile lavage. Cement is mixed and once ready for  implantation, the size 4 tibial implant, size  5 posterior stabilized femoral component, and the size 35 patella are cemented in place and the patella is held with the clamp. The trial insert is placed and the knee held in full extension.   All extruded cement is removed and once the cement is hard the permanent 6 mm posterior stabilized rotating platform insert is placed into the tibial tray.      The wound is copiously irrigated with saline solution and the extensor mechanism closed over a hemovac drain with #1 V-loc suture. The tourniquet is released for a total tourniquet time of 30  minutes. Flexion against gravity is 140 degrees and the patella tracks normally. Subcutaneous tissue is closed with 2.0 vicryl and subcuticular with running 4.0 Monocryl. The incisions are cleaned and dried and steri-strips and  bulky sterile dressings are applied. The limbs are placed into knee immobilizers and the patient is awakened and transported to recovery in stable condition.            Please note that a surgical assistant was a medical necessity for this procedure in order to perform it in a safe and expeditious manner. Surgical assistant was necessary to retract the ligaments and vital neurovascular structures to prevent injury to them and also necessary for proper positioning of the limb to allow for anatomic placement of the prosthesis.   Dione Plover Deziree Mokry, MD    05/27/2017, 12:04 PM

## 2017-05-27 NOTE — Anesthesia Postprocedure Evaluation (Signed)
Anesthesia Post Note  Patient: Sherry [name]  Procedure(s) Performed: TOTAL KNEE BILATERAL (Bilateral Knee)     Patient location during evaluation: PACU Anesthesia Type: Spinal Level of consciousness: oriented, awake and alert, awake and patient cooperative Pain management: pain level controlled Vital Signs Assessment: post-procedure vital signs reviewed and stable Respiratory status: spontaneous breathing, respiratory function stable, patient connected to nasal cannula oxygen and nonlabored ventilation Cardiovascular status: blood pressure returned to baseline and stable Postop Assessment: no headache, no backache, no apparent nausea or vomiting and spinal receding Anesthetic complications: no Comments: Patient with known intrathecal catheter. Catheter used intraoperatively. 0.2% Ropiv infusion started in PACU after patient demonstrated BLE motor function.      Last Vitals:  Vitals:   05/27/17 1631 05/27/17 1700  BP: 107/70 101/62  Pulse: 78 92  Resp: 14 15  Temp: 36.4 C 36.4 C  SpO2: 97% 97%    Last Pain:  Vitals:   05/27/17 1825  TempSrc:   PainSc: 0-No pain                 Catalina Gravel

## 2017-05-27 NOTE — Progress Notes (Addendum)
ANTICOAGULATION CONSULT NOTE - Initial Consult  Pharmacy Consult for warfarin Indication: VTE prophylaxis  Allergies  Allergen Reactions  . Adhesive [Tape] Other (See Comments)    Blisters; "gets puffy and red" Liquid Bandaids    Patient Measurements: Height: 5\' 5"  (165.1 cm) Weight: 148 lb (67.1 kg) IBW/kg (Calculated) : 57 Heparin Dosing Weight:   Vital Signs: Temp: 97.5 F (36.4 C) (02/13 1530) Temp Source: Oral (02/13 0815) BP: 112/66 (02/13 1530) Pulse Rate: 75 (02/13 1530)  Labs: No results for input(s): HGB, HCT, PLT, APTT, LABPROT, INR, HEPARINUNFRC, HEPRLOWMOCWT, CREATININE, CKTOTAL, CKMB, TROPONINI in the last 72 hours.  Estimated Creatinine Clearance: 58.9 mL/min (by C-G formula based on SCr of 0.74 mg/dL).   Medical History: Past Medical History:  Diagnosis Date  . Anxiety   . Carpal tunnel syndrome on both sides   . Chronic back pain    herniated disc,stenosis,radiculopathy,spondylolisthesis  . Chronic neck pain   . Constipation, chronic   . Degenerative spinal arthritis    cervical and lumbar  . Depression   . Eczema   . GERD (gastroesophageal reflux disease)   . Hemorrhoids   . Hiatal hernia   . History of bronchitis    "I've had it a number of times"  . IBS (irritable bowel syndrome)    lactose intolerant  . Osteoarthritis    bilateral knees and shoulders,  little fingers  . TMJ (dislocation of temporomandibular joint)   . Wears glasses     Assessment: 43 YOF presents for BL TKA.  Currently has epidural in place.  Pharmacy asked to dose warfarin for VTE prophylaxis.  - Patient not on anticoagulation prior to admission - Baseline INR = 0.93  Goal of Therapy:  INR 2-3   Plan:   Warfarin 2.5mg  PO x 1 tonight  Daily INR  Follow-up plan for removal of epidural  Doreene Eland, PharmD, BCPS.   Pager: [phone] 05/27/2017 4:08 PM

## 2017-05-27 NOTE — Progress Notes (Signed)
Sherry [name] called reg starting coumadin 2.5 mg tonight. Order received to start coumadin 2.5 mg at 9am tomorrow instead of tonight.

## 2017-05-27 NOTE — Transfer of Care (Addendum)
Immediate Anesthesia Transfer of Care Note  Patient: Sherry [name]  Procedure(s) Performed: Procedure(s): TOTAL KNEE BILATERAL (Bilateral)  Patient Location: PACU  Anesthesia Type:MAC and Spinal  Level of Consciousness: Patient easily awoken, sedated, comfortable, cooperative, following commands, responds to stimulation.   Airway & Oxygen Therapy: Patient spontaneously breathing, ventilating well, oxygen via simple oxygen mask.  Post-op Assessment: Report given to PACU RN, vital signs reviewed and stable.   Post vital signs: Reviewed and stable.  Complications: No apparent anesthesia complications  Last Vitals:  Vitals:   05/27/17 0815  BP: (!) 155/89  Pulse: 80  Resp: 16  Temp: 36.8 C  SpO2: 99%    Last Pain:  Vitals:   05/27/17 0815  TempSrc: Oral      Patients Stated Pain Goal: 4 (34/74/25 9563)  Complications: No apparent anesthesia complications

## 2017-05-27 NOTE — Anesthesia Procedure Notes (Signed)
Epidural Patient location during procedure: OB Start time: 05/27/2017 9:40 AM End time: 05/27/2017 10:00 AM  Staffing Anesthesiologist: Catalina Gravel, MD Performed: anesthesiologist   Preanesthetic Checklist Completed: patient identified, pre-op evaluation, timeout performed, IV checked, risks and benefits discussed and monitors and equipment checked  Epidural Patient position: sitting Prep: DuraPrep Patient monitoring: blood pressure and continuous pulse ox Approach: midline Location: L3-L4 Injection technique: LOR air  Needle:  Needle type: Tuohy  Needle gauge: 17 G Needle length: 9 cm Needle insertion depth: 5 cm Catheter size: 19 Gauge Catheter at skin depth: 10 cm Epidural test dose: 1% Lidocaine.  Additional Notes Patient with previous L3-5 PLIF. Multiple attempts by two MDAs.  Final attempt with intrathecal placement, +CSF return. Catheter advanced through touhy and touhy removed.  +Aspiration of CSF via catheter. Spinal anesthetic delivered via catheter.  Catheter secured at 10cm at skin and patient laid supine. Reason for block:surgical anesthesia

## 2017-05-27 NOTE — Progress Notes (Signed)
Patient has not regained any movement to ble. Sensation slowly returning. Dr. Gifford Shave notified. Received order to decrease rate. Will cont to monitor.

## 2017-05-27 NOTE — Progress Notes (Signed)
Inpatient Rehabilitation  Received consult request for an IP Rehab admission.  Plan to follow up tomorrow after therapy evaluations have been completed.  Call if questions.   Carmelia Roller., CCC/SLP Admission Coordinator  [place]  Cell [phone]

## 2017-05-27 NOTE — Interval H&P Note (Signed)
History and Physical Interval Note:  05/27/2017 8:22 AM  Sherry [name]  has presented today for surgery, with the diagnosis of Osteoarthritis Bilateral Knees  The various methods of treatment have been discussed with the patient and family. After consideration of risks, benefits and other options for treatment, the patient has consented to  Procedure(s): TOTAL KNEE BILATERAL (Bilateral) as a surgical intervention .  The patient's history has been reviewed, patient examined, no change in status, stable for surgery.  I have reviewed the patient's chart and labs.  Questions were answered to the patient's satisfaction.     Pilar Plate Phoenyx Paulsen

## 2017-05-27 NOTE — Anesthesia Post-op Follow-up Note (Signed)
  Anesthesia Pain Follow-up Note  Patient: Sherry [name]  Day #: 0  Date of Follow-up: 05/27/2017 Time: 6:56 PM  Last Vitals:  Vitals:   05/27/17 1700 05/27/17 1853  BP: 101/62 (!) 97/56  Pulse: 92 85  Resp: 15 15  Temp: 36.4 C (!) 36.3 C  SpO2: 97% 99%    Level of Consciousness: alert  Pain: mild   Side Effects:None  Catheter Site Exam:clean, dry, no drainage  Anti-Coag Meds (From admission, onward)   Start     Dose/Rate Route Frequency Ordered Stop   05/28/17 1800  Warfarin - Pharmacist Dosing Inpatient      Does not apply Daily-1800 05/27/17 1617     05/27/17 2200  warfarin (COUMADIN) tablet 2.5 mg     2.5 mg Oral  Once 05/27/17 1617      Epidural / Intrathecal (From admission, onward)   Start     Dose/Rate Route Frequency Ordered Stop   05/27/17 1200  ropivacaine (PF) 2 mg/mL (0.2%) (NAROPIN) injection     1 mL/hr 1 mL/hr  Intrathecal Continuous 05/27/17 1154 05/29/17 1159     Patient with dense motor block, bilateral T10 sensory level to ice.  Given findings, will decrease INTRATHECAL infusion to 1cc/hr.  Spoke to patient's floor nurse and relayed instructions.  Please contact anesthesia with any further questions.  Plan: Modify therapy to reduce side effects at surgeon's request  Catalina Gravel

## 2017-05-28 ENCOUNTER — Encounter (HOSPITAL_COMMUNITY): Payer: Self-pay | Admitting: Orthopedic Surgery

## 2017-05-28 LAB — BASIC METABOLIC PANEL
Anion gap: 11 (ref 5–15)
BUN: 10 mg/dL (ref 6–20)
CO2: 23 mmol/L (ref 22–32)
Calcium: 8.2 mg/dL — ABNORMAL LOW (ref 8.9–10.3)
Chloride: 105 mmol/L (ref 101–111)
Creatinine, Ser: 0.57 mg/dL (ref 0.44–1.00)
GFR calc Af Amer: >60 mL/min (ref >60)
GFR calc non Af Amer: >60 mL/min (ref >60)
Glucose, Bld: 133 mg/dL — ABNORMAL HIGH (ref 65–99)
Potassium: 3.9 mmol/L (ref 3.5–5.1)
Sodium: 139 mmol/L (ref 135–145)

## 2017-05-28 LAB — CBC
HCT: 31.6 % — ABNORMAL LOW (ref 36.0–46.0)
Hemoglobin: 10.7 g/dL — ABNORMAL LOW (ref 12.0–15.0)
MCH: 32.1 pg (ref 26.0–34.0)
MCHC: 33.9 g/dL (ref 30.0–36.0)
MCV: 94.9 fL (ref 78.0–100.0)
Platelets: 243 10*3/uL (ref 150–400)
RBC: 3.33 MIL/uL — ABNORMAL LOW (ref 3.87–5.11)
RDW: 12.8 % (ref 11.5–15.5)
WBC: 12.7 10*3/uL — ABNORMAL HIGH (ref 4.0–10.5)

## 2017-05-28 LAB — PROTIME-INR
INR: 1.05
Prothrombin Time: 13.6 seconds (ref 11.4–15.2)

## 2017-05-28 MED ORDER — OMEPRAZOLE 20 MG PO CPDR
20.0000 mg | DELAYED_RELEASE_CAPSULE | Freq: Every day | ORAL | Status: DC | PRN
  Administered 2017-05-30 – 2017-06-01 (×3): 20 mg via ORAL
  Filled 2017-05-28 (×3): qty 1

## 2017-05-28 NOTE — Addendum Note (Signed)
Addendum  created 05/28/17 1416 by Myrtie Soman, MD   Sign clinical note

## 2017-05-28 NOTE — Progress Notes (Signed)
Physical Therapy Treatment Patient Details Name: Sherry [name] MRN: [id number] DOB: March 24, 1946 Today's Date: 05/28/2017    History of Present Illness S/P bil TKA    PT Comments    The patient tolerated standing and taking a few side steps along the bed.  BP stable at 127/75, HR 112 after standing. Continue PT.   Follow Up Recommendations  CIR     Equipment Recommendations  Rolling walker with 5" wheels    Recommendations for Other Services Rehab consult     Precautions / Restrictions Precautions Precautions: Knee;Fall Precaution Comments: epidural, monitor BP Required Braces or Orthoses: Knee Immobilizer - Right;Knee Immobilizer - Left    Mobility  Bed Mobility   Bed Mobility: Supine to Sit;Sit to Supine     Supine to sit: Min assist Sit to supine: Min assist   General bed mobility comments: patient used leg lifter to self assist the legs, cus to use UE's once  feet over bed edge.  Transfers Overall transfer level: Needs assistance Equipment used: Rolling walker (2 wheeled) Transfers: Sit to/from Stand Sit to Stand: Mod assist;+2 physical assistance;+2 safety/equipment         General transfer comment: from very high bed; assist to rise and stabilize. Cues for walking feet forward and backwards and cues for UE placement  Ambulation/Gait Ambulation/Gait assistance: Mod assist;+2 physical assistance;+2 safety/equipment Ambulation Distance (Feet): 4 Feet Assistive device: Rolling walker (2 wheeled) Gait Pattern/deviations: Step-to pattern     General Gait Details: side steps along the bed, cues for technique.    Stairs            Wheelchair Mobility    Modified Rankin (Stroke Patients Only)       Balance                                            Cognition Arousal/Alertness: Awake/alert                                            Exercises      General Comments        Pertinent Vitals/Pain  Pain Assessment: 0-10 Faces Pain Scale: Hurts even more Pain Location: R knee > L knee Pain Descriptors / Indicators: Aching;Discomfort Pain Intervention(s): Premedicated before session;Ice applied    Home Living                      Prior Function            PT Goals (current goals can now be found in the care plan section) Progress towards PT goals: Progressing toward goals    Frequency    7X/week      PT Plan Current plan remains appropriate    Co-evaluation              AM-PAC PT "6 Clicks" Daily Activity  Outcome Measure  Difficulty turning over in bed (including adjusting bedclothes, sheets and blankets)?: Unable Difficulty moving from lying on back to sitting on the side of the bed? : Unable Difficulty sitting down on and standing up from a chair with arms (e.g., wheelchair, bedside commode, etc,.)?: Unable Help needed moving to and from a bed to chair (including a wheelchair)?: Total Help needed walking in hospital room?:  Total Help needed climbing 3-5 steps with a railing? : Total 6 Click Score: 6    End of Session Equipment Utilized During Treatment: Gait belt;Right knee immobilizer;Left knee immobilizer Activity Tolerance: Patient tolerated treatment well Patient left: in bed;with call bell/phone within reach Nurse Communication: Mobility status PT Visit Diagnosis: Unsteadiness on feet (R26.81);Pain Pain - Right/Left: Right Pain - part of body: Knee     Time: 6924-9324 PT Time Calculation (min) (ACUTE ONLY): 26 min  Charges:  $Therapeutic Activity: 23-37 mins                    G CodesTresa Endo PT [phone]    Claretha Cooper 05/28/2017, 4:25 PM

## 2017-05-28 NOTE — Evaluation (Signed)
Occupational Therapy Evaluation Patient Details Name: Sherry [name] MRN: [id number] DOB: 1945-10-27 Today's Date: 05/28/2017    History of Present Illness S/P bil TKA   Clinical Impression   This 72 year old female was admitted for the above sx. She will benefit from continued OT in acute setting.  Pt had epidural in place during eval and was able to stand briefly, but she became orthostatic.  Will follow in acute setting with min A level goals.    Follow Up Recommendations  CIR    Equipment Recommendations  3 in 1 bedside commode    Recommendations for Other Services       Precautions / Restrictions Precautions Precautions: Knee;Fall Required Braces or Orthoses: Knee Immobilizer - Right;Knee Immobilizer - Left Restrictions Weight Bearing Restrictions: No Other Position/Activity Restrictions: WBAT      Mobility Bed Mobility Overal bed mobility: Needs Assistance Bed Mobility: Supine to Sit;Sit to Supine     Supine to sit: Mod assist;+2 for safety/equipment Sit to supine: Mod assist;+2 for safety/equipment   General bed mobility comments: assist for legs, cues for technique  Transfers Overall transfer level: Needs assistance Equipment used: Rolling walker (2 wheeled) Transfers: Sit to/from Stand Sit to Stand: Mod assist;+2 physical assistance;From elevated surface         General transfer comment: from very high bed; assist to rise and stabilize. Cues for walking feet forward and backwards and cues for UE placement    Balance                                           ADL either performed or assessed with clinical judgement   ADL Overall ADL's : Needs assistance/impaired Eating/Feeding: Independent   Grooming: Set up;Sitting   Upper Body Bathing: Set up;Sitting   Lower Body Bathing: Maximal assistance;+2 for physical assistance;Sit to/from stand   Upper Body Dressing : Minimal assistance;Sitting(lines)   Lower Body Dressing: Total  assistance;+2 for physical assistance;Sit to/from stand                 General ADL Comments: pt has epidural in place. Stood but orthostatic:  see vitals section of chart.  Educated on concept of AE. She has a sock aide at home.       Vision         Perception     Praxis      Pertinent Vitals/Pain Pain Assessment: Faces Faces Pain Scale: Hurts whole lot Pain Location: R knee > L knee Pain Descriptors / Indicators: Aching Pain Intervention(s): Limited activity within patient's tolerance;Monitored during session;Premedicated before session;Repositioned;Ice applied;Patient requesting pain meds-RN notified     Hand Dominance     Extremity/Trunk Assessment Upper Extremity Assessment Upper Extremity Assessment: Overall WFL for tasks assessed(pt reports arms go numb when sleeping)           Communication Communication Communication: No difficulties   Cognition Arousal/Alertness: Awake/alert Behavior During Therapy: WFL for tasks assessed/performed Overall Cognitive Status: Within Functional Limits for tasks assessed                                     General Comments       Exercises     Shoulder Instructions      Home Living Family/patient expects to be discharged to:: Inpatient rehab Living Arrangements:  Spouse/significant other   Type of Home: House Home Access: Stairs to enter [employer] of Steps: 2                       Additional Comments: bed and bath upstairs. Walk in shower with seat. HH shower, higher commodes      Prior Functioning/Environment Level of Independence: Independent        Comments: cares for grandchildren. Could not carry baby upstairs        OT Problem List: Decreased strength;Decreased activity tolerance;Decreased knowledge of use of DME or AE;Decreased knowledge of precautions;Cardiopulmonary status limiting activity;Pain      OT Treatment/Interventions: Self-care/ADL training;DME  and/or AE instruction;Patient/family education    OT Goals(Current goals can be found in the care plan section) Acute Rehab OT Goals Patient Stated Goal: return to caring for grandchildren OT Goal Formulation: With patient Time For Goal Achievement: 06/11/17 Potential to Achieve Goals: Good ADL Goals Pt Will Perform Grooming: with min guard assist;standing Pt Will Perform Lower Body Bathing: with min assist;with adaptive equipment;sit to/from stand Pt Will Transfer to Toilet: with min assist;ambulating;bedside commode Pt Will Perform Toileting - Clothing Manipulation and hygiene: with min assist;sit to/from stand  OT Frequency: Min 2X/week   Barriers to D/C:            Co-evaluation              AM-PAC PT "6 Clicks" Daily Activity     Outcome Measure Help from another person eating meals?: None Help from another person taking care of personal grooming?: A Little Help from another person toileting, which includes using toliet, bedpan, or urinal?: A Lot Help from another person bathing (including washing, rinsing, drying)?: A Lot Help from another person to put on and taking off regular upper body clothing?: A Little Help from another person to put on and taking off regular lower body clothing?: Total 6 Click Score: 15   End of Session CPM Left Knee CPM Left Knee: Off CPM Right Knee CPM Right Knee: Off  Activity Tolerance: Patient tolerated treatment well Patient left: in bed;with call bell/phone within reach  OT Visit Diagnosis: Pain Pain - Right/Left: (bil) Pain - part of body: Knee                Time: 7353-2992 OT Time Calculation (min): 29 min Charges:  OT General Charges $OT Visit: 1 Visit OT Evaluation $OT Eval Low Complexity: 1 Low G-Codes:     [place], OTR/L [phone] 05/28/2017  Kiwan Gadsden 05/28/2017, 10:10 AM

## 2017-05-28 NOTE — Progress Notes (Signed)
Physical Therapy Treatment Patient Details Name: Sherry [name] MRN: [id number] DOB: [birth year] Today's Date: 05/28/2017    History of Present Illness S/P bil TKA    PT Comments    The patient tolerated standing for about 1 minute. Increased pain in right knee reported. Assisted back into bed. Continue PT. BP 114/63 supine ,90/50 after standing.   Follow Up Recommendations  CIR     Equipment Recommendations  Rolling walker with 5" wheels    Recommendations for Other Services Rehab consult     Precautions / Restrictions Precautions Precautions: Knee;Fall Precaution Comments: epidural Required Braces or Orthoses: Knee Immobilizer - Right;Knee Immobilizer - Left Knee Immobilizer - Right: Discontinue once straight leg raise with < 10 degree lag Knee Immobilizer - Left: Discontinue once straight leg raise with < 10 degree lag Restrictions Weight Bearing Restrictions: No Other Position/Activity Restrictions: WBAT    Mobility  Bed Mobility Overal bed mobility: Needs Assistance Bed Mobility: Supine to Sit;Sit to Supine     Supine to sit: Mod assist;+2 for safety/equipment Sit to supine: Mod assist;+2 for safety/equipment   General bed mobility comments: multimodal cues to weight shift legs , assist legs to bed edge and down to floor. Assist with legs back onto bed . KI in place.  Transfers Overall transfer level: Needs assistance Equipment used: Rolling walker (2 wheeled) Transfers: Sit to/from Stand Sit to Stand: Mod assist;+2 physical assistance;From elevated surface         General transfer comment: from very high bed; assist to rise and stabilize. Cues for walking feet forward and backwards and cues for UE placement  Ambulation/Gait Ambulation/Gait assistance: Mod assist;Max assist;+2 physical assistance;+2 safety/equipment   Assistive device: Rolling walker (2 wheeled) Gait Pattern/deviations: Step-to pattern     General Gait Details: able to step 3  forward and 3 steps backward, Cues for posture. BP after standing 90/60.   Stairs            Wheelchair Mobility    Modified Rankin (Stroke Patients Only)       Balance                                            Cognition Arousal/Alertness: Awake/alert Behavior During Therapy: WFL for tasks assessed/performed Overall Cognitive Status: Within Functional Limits for tasks assessed                                        Exercises   General Comments        Pertinent Vitals/Pain Pain Assessment: Faces Faces Pain Scale: Hurts even more Pain Location: R knee > L knee Pain Descriptors / Indicators: Aching;Discomfort Pain Intervention(s): Patient requesting pain meds-RN notified;Ice applied;Monitored during session    Xenia expects to be discharged to:: Inpatient rehab Living Arrangements: Spouse/significant other Available Help at Discharge: Family Type of Home: House Home Access: Stairs to enter   Home Layout: Two level Home Equipment: Environmental consultant - 2 wheels;Shower seat;Adaptive equipment;Grab bars - toilet;Grab bars - tub/shower Additional Comments: bed and bath upstairs. Walk in shower with seat. HH shower, higher commodes    Prior Function Level of Independence: Independent      Comments: cares for grandchildren. Could not carry baby upstairs   PT Goals (current goals can now be found in the  care plan section) Acute Rehab PT Goals Patient Stated Goal: return to caring for grandchildren PT Goal Formulation: With patient Time For Goal Achievement: 06/04/17 Potential to Achieve Goals: Good Progress towards PT goals: Progressing toward goals    Frequency    7X/week      PT Plan      Co-evaluation PT/OT/SLP Co-Evaluation/Treatment: Yes Reason for Co-Treatment: For patient/therapist safety PT goals addressed during session: Mobility/safety with mobility OT goals addressed during session: ADL's and  self-care      AM-PAC PT "6 Clicks" Daily Activity  Outcome Measure  Difficulty turning over in bed (including adjusting bedclothes, sheets and blankets)?: Unable Difficulty moving from lying on back to sitting on the side of the bed? : Unable Difficulty sitting down on and standing up from a chair with arms (e.g., wheelchair, bedside commode, etc,.)?: Unable Help needed moving to and from a bed to chair (including a wheelchair)?: Total Help needed walking in hospital room?: Total Help needed climbing 3-5 steps with a railing? : Total 6 Click Score: 6    End of Session Equipment Utilized During Treatment: Gait belt;Right knee immobilizer;Left knee immobilizer Activity Tolerance: Patient tolerated treatment well Patient left: in bed;with call bell/phone within reach Nurse Communication: Patient requests pain meds;Mobility status PT Visit Diagnosis: Unsteadiness on feet (R26.81);Pain Pain - Right/Left: Right Pain - part of body: Knee     Time: 0920-0946 PT Time Calculation (min) (ACUTE ONLY): 26 min  Charges:  $Therapeutic Activity: 8-22 mins                    G CodesTresa Endo PT [phone]   Claretha Cooper 05/28/2017, 11:42 AM

## 2017-05-28 NOTE — Evaluation (Signed)
Physical Therapy Evaluation Patient Details Name: Sherry [name] MRN: [id number] DOB: 1945-11-13 Today's Date: 05/28/2017   History of Present Illness  S/P bil TKA  Clinical Impression  The patient participated in the therapeutic exercises for both  LE's. Patient reports R > L knee painful. Epidural in place. Will sit and attempt standing next visit. Pt admitted with above diagnosis. Pt currently with functional limitations due to the deficits listed below (see PT Problem List). Pt will benefit from skilled PT to increase their independence and safety with mobility to allow discharge to the venue listed below.       Follow Up Recommendations CIR    Equipment Recommendations  Rolling walker with 5" wheels    Recommendations for Other Services Rehab consult     Precautions / Restrictions Precautions Precautions: Knee;Fall Precaution Comments: epidural Required Braces or Orthoses: Knee Immobilizer - Right;Knee Immobilizer - Left Knee Immobilizer - Right: Discontinue once straight leg raise with < 10 degree lag Knee Immobilizer - Left: Discontinue once straight leg raise with < 10 degree lag Restrictions Weight Bearing Restrictions: No Other Position/Activity Restrictions: WBAT      Mobility  The patient is able to use 1 rail to sit upright in long sitting in preparation for bed mobility. Min guard assistance required. Ambulation/Gait                Stairs            Wheelchair Mobility    Modified Rankin (Stroke Patients Only)       Balance                                             Pertinent Vitals/Pain Pain Assessment: Faces Faces Pain Scale: Hurts whole lot Pain Location: R knee > L knee Pain Descriptors / Indicators: Aching Pain Intervention(s): Monitored during session;Premedicated before session;Limited activity within patient's tolerance(epidural)    Home Living Family/patient expects to be discharged to:: Inpatient  rehab Living Arrangements: Spouse/significant other Available Help at Discharge: Family Type of Home: House Home Access: Stairs to enter   [employer] of Steps: 2 Home Layout: Two level Home Equipment: Environmental consultant - 2 wheels;Shower seat;Adaptive equipment;Grab bars - toilet;Grab bars - tub/shower Additional Comments: bed and bath upstairs. Walk in shower with seat. HH shower, higher commodes    Prior Function Level of Independence: Independent         Comments: cares for grandchildren. Could not carry baby upstairs     Hand Dominance        Extremity/Trunk Assessment   Upper Extremity Assessment Upper Extremity Assessment: Defer to OT evaluation    Lower Extremity Assessment Lower Extremity Assessment: RLE deficits/detail;LLE deficits/detail RLE Deficits / Details: 10-45 knee flexion, 3/5 quad strength LLE Deficits / Details: 10-50 knee flexion, 3+ quad stength       Communication   Communication: No difficulties  Cognition Arousal/Alertness: Awake/alert Behavior During Therapy: WFL for tasks assessed/performed Overall Cognitive Status: Within Functional Limits for tasks assessed                                        General Comments      Exercises Total Joint Exercises Ankle Circles/Pumps: AROM;Both;10 reps Quad Sets: AROM;Both;10 reps Short Arc QuadSinclair [name];Both;10 reps Heel Slides: AAROM;Both;10 reps  Hip ABduction/ADduction: AAROM;Both;10 reps Straight Leg Raises: AAROM;Both;10 reps   Assessment/Plan    PT Assessment Patient needs continued PT services  PT Problem List Decreased strength;Decreased range of motion;Decreased knowledge of use of DME;Decreased activity tolerance;Decreased safety awareness;Decreased balance;Decreased knowledge of precautions;Decreased mobility       PT Treatment Interventions DME instruction;Gait training;Functional mobility training;Patient/family education;Therapeutic activities;Therapeutic  exercise;Stair training    PT Goals (Current goals can be found in the Care Plan section)  Acute Rehab PT Goals Patient Stated Goal: return to caring for grandchildren PT Goal Formulation: With patient Time For Goal Achievement: 06/04/17 Potential to Achieve Goals: Good    Frequency 7X/week   Barriers to discharge Inaccessible home environment      Co-evaluation               AM-PAC PT "6 Clicks" Daily Activity  Outcome Measure Difficulty turning over in bed (including adjusting bedclothes, sheets and blankets)?: Unable Difficulty moving from lying on back to sitting on the side of the bed? : Unable Difficulty sitting down on and standing up from a chair with arms (e.g., wheelchair, bedside commode, etc,.)?: Unable Help needed moving to and from a bed to chair (including a wheelchair)?: Total Help needed walking in hospital room?: Total Help needed climbing 3-5 steps with a railing? : Total 6 Click Score: 6    End of Session   Activity Tolerance: Patient tolerated treatment well Patient left: in bed;with call bell/phone within reach Nurse Communication: Mobility status PT Visit Diagnosis: Unsteadiness on feet (R26.81);Pain Pain - Right/Left: Right Pain - part of body: Knee    Time: 4034-7425 PT Time Calculation (min) (ACUTE ONLY): 22 min   Charges:   PT Evaluation $PT Eval Low Complexity: 1 Low     PT G CodesTresa [name] PT [phone]   Sherry [name] 05/28/2017, 11:33 AM

## 2017-05-28 NOTE — Anesthesia Post-op Follow-up Note (Signed)
  Anesthesia Pain Follow-up Note  Patient: ELVERIA LAUDERBAUGH  Day #: 1  Date of Follow-up: 05/28/2017 Time: 2:14 PM  Last Vitals:  Vitals:   05/28/17 1000 05/28/17 1022  BP: 121/60 115/72  Pulse: 83 90  Resp: 16 16  Temp: 36.4 C 37 C  SpO2: 100% 100%    Level of Consciousness: alert  Pain: moderate Primarily right sided analgesia. Temp level to T9 on R, L1 on left. Able to flex both feet, will leave as is until PT is done today. Will increase rate if analgesia inadequate on L.  Side Effects:None  Catheter Site Exam:clean  Anti-Coag Meds (From admission, onward)   Start     Dose/Rate Route Frequency Ordered Stop   05/28/17 1800  Warfarin - Pharmacist Dosing Inpatient      Does not apply Daily-1800 05/27/17 1617      Epidural / Intrathecal (From admission, onward)   Start     Dose/Rate Route Frequency Ordered Stop   05/27/17 1200  ropivacaine (PF) 2 mg/mL (0.2%) (NAROPIN) injection     1 mL/hr 1 mL/hr  Intrathecal Continuous 05/27/17 1154 05/29/17 1159       Plan: Continue current therapy of postop epidural at surgeon's request  Jaydynn Wolford S

## 2017-05-28 NOTE — Progress Notes (Signed)
Consult-SNF  CSW will follow for SNF placement if needed at time of discharge.  CIR recommended at this time.   Kathrin Greathouse, Latanya Presser, MSW Clinical Social Worker  [phone] 05/28/2017  11:46 AM

## 2017-05-28 NOTE — Discharge Instructions (Addendum)
° °Dr. Frank Aluisio °Total Joint Specialist °[facility] °[address] °[place], [place] [address] °[phone] ° °TOTAL KNEE REPLACEMENT POSTOPERATIVE DIRECTIONS ° °Knee Rehabilitation, Guidelines Following Surgery  °Results after knee surgery are often greatly improved when you follow the exercise, range of motion and muscle strengthening exercises prescribed by your doctor. Safety measures are also important to protect the knee from further injury. Any time any of these exercises cause you to have increased pain or swelling in your knee joint, decrease the amount until you are comfortable again and slowly increase them. If you have problems or questions, call your caregiver or physical therapist for advice.  ° °HOME CARE INSTRUCTIONS  °Remove items at home which could result in a fall. This includes throw rugs or furniture in walking pathways.  °· ICE to the affected knee every three hours for 30 minutes at a time and then as needed for pain and swelling.  Continue to use ice on the knee for pain and swelling from surgery. You may notice swelling that will progress down to the foot and ankle.  This is normal after surgery.  Elevate the leg when you are not up walking on it.   °· Continue to use the breathing machine which will help keep your temperature down.  It is common for your temperature to cycle up and down following surgery, especially at night when you are not up moving around and exerting yourself.  The breathing machine keeps your lungs expanded and your temperature down. °· Do not place pillow under knee, focus on keeping the knee straight while resting ° °DIET °You may resume your previous home diet once your are discharged from the hospital. ° °DRESSING / WOUND CARE / SHOWERING °You may shower 3 days after surgery, but keep the wounds dry during showering.  You may use an occlusive plastic wrap (Press'n Seal for example), NO SOAKING/SUBMERGING IN THE BATHTUB.  If the bandage gets  wet, change with a clean dry gauze.  If the incision gets wet, pat the wound dry with a clean towel. °You may start showering once you are discharged home but do not submerge the incision under water. Just pat the incision dry and apply a dry gauze dressing on daily. °Change the surgical dressing daily and reapply a dry dressing each time. ° °ACTIVITY °Walk with your walker as instructed. °Use walker as long as suggested by your caregivers. °Avoid periods of inactivity such as sitting longer than an hour when not asleep. This helps prevent blood clots.  °You may resume a sexual relationship in one month or when given the OK by your doctor.  °You may return to work once you are cleared by your doctor.  °Do not drive a car for 6 weeks or until released by you surgeon.  °Do not drive while taking narcotics. ° °WEIGHT BEARING °Weight bearing as tolerated with assist device (walker, cane, etc) as directed, use it as long as suggested by your surgeon or therapist, typically at least 4-6 weeks. ° °POSTOPERATIVE CONSTIPATION PROTOCOL °Constipation - defined medically as fewer than three stools per week and severe constipation as less than one stool per week. ° °One of the most common issues patients have following surgery is constipation.  Even if you have a regular bowel pattern at home, your normal regimen is likely to be disrupted due to multiple reasons following surgery.  Combination of anesthesia, postoperative narcotics, change in appetite and fluid intake all can affect your bowels.    In order to avoid complications following surgery, here are some recommendations in order to help you during your recovery period. ° °Colace (docusate) - Pick up an over-the-counter form of Colace or another stool softener and take twice a day as long as you are requiring postoperative pain medications.  Take with a full glass of water daily.  If you experience loose stools or diarrhea, hold the colace until you stool forms back up.  If  your symptoms do not get better within 1 week or if they get worse, check with your doctor. ° °Dulcolax (bisacodyl) - Pick up over-the-counter and take as directed by the product packaging as needed to assist with the movement of your bowels.  Take with a full glass of water.  Use this product as needed if not relieved by Colace only.  ° °MiraLax (polyethylene glycol) - Pick up over-the-counter to have on hand.  MiraLax is a solution that will increase the amount of water in your bowels to assist with bowel movements.  Take as directed and can mix with a glass of water, juice, soda, coffee, or tea.  Take if you go more than two days without a movement. °Do not use MiraLax more than once per day. Call your doctor if you are still constipated or irregular after using this medication for 7 days in a row. ° °If you continue to have problems with postoperative constipation, please contact the office for further assistance and recommendations.  If you experience "the worst abdominal pain ever" or develop nausea or vomiting, please contact the office immediatly for further recommendations for treatment. ° °ITCHING ° If you experience itching with your medications, try taking only a single pain pill, or even half a pain pill at a time.  You can also use Benadryl over the counter for itching or also to help with sleep.  ° °TED HOSE STOCKINGS °Wear the elastic stockings on both legs for three weeks following surgery during the day but you may remove then at night for sleeping. ° °MEDICATIONS °See your medication summary on the “After Visit Summary” that the nursing staff will review with you prior to discharge.  You may have some home medications which will be placed on hold until you complete the course of blood thinner medication.  It is important for you to complete the blood thinner medication as prescribed by your surgeon.  Continue your approved medications as instructed at time of discharge. ° °PRECAUTIONS °If you  experience chest pain or shortness of breath - call 911 immediately for transfer to the hospital emergency department.  °If you develop a fever greater that 101 F, purulent drainage from wound, increased redness or drainage from wound, foul odor from the wound/dressing, or calf pain - CONTACT YOUR SURGEON.   °                                                °FOLLOW-UP APPOINTMENTS °Make sure you keep all of your appointments after your operation with your surgeon and caregivers. You should call the office at the above phone number and make an appointment for approximately two weeks after the date of your surgery or on the date instructed by your surgeon outlined in the "After Visit Summary". ° ° °RANGE OF MOTION AND STRENGTHENING EXERCISES  °Rehabilitation of the knee is important following a knee injury or   an operation. After just a few days of immobilization, the muscles of the thigh which control the knee become weakened and shrink (atrophy). Knee exercises are designed to build up the tone and strength of the thigh muscles and to improve knee motion. Often times heat used for twenty to thirty minutes before working out will loosen up your tissues and help with improving the range of motion but do not use heat for the first two weeks following surgery. These exercises can be done on a training (exercise) mat, on the floor, on a table or on a bed. Use what ever works the best and is most comfortable for you Knee exercises include:  Leg Lifts - While your knee is still immobilized in a splint or cast, you can do straight leg raises. Lift the leg to 60 degrees, hold for 3 sec, and slowly lower the leg. Repeat 10-20 times 2-3 times daily. Perform this exercise against resistance later as your knee gets better.  Quad and Hamstring Sets - Tighten up the muscle on the front of the thigh (Quad) and hold for 5-10 sec. Repeat this 10-20 times hourly. Hamstring sets are done by pushing the foot backward against an object  and holding for 5-10 sec. Repeat as with quad sets.   Leg Slides: Lying on your back, slowly slide your foot toward your buttocks, bending your knee up off the floor (only go as far as is comfortable). Then slowly slide your foot back down until your leg is flat on the floor again.  Angel Wings: Lying on your back spread your legs to the side as far apart as you can without causing discomfort.  A rehabilitation program following serious knee injuries can speed recovery and prevent re-injury in the future due to weakened muscles. Contact your doctor or a physical therapist for more information on knee rehabilitation.   IF YOU ARE TRANSFERRED TO A SKILLED REHAB FACILITY If the patient is transferred to a skilled rehab facility following release from the hospital, a list of the current medications will be sent to the facility for the patient to continue.  When discharged from the skilled rehab facility, please have the facility set up the patient's [place] prior to being released. Also, the skilled facility will be responsible for providing the patient with their medications at time of release from the facility to include their pain medication, the muscle relaxants, and their blood thinner medication. If the patient is still at the rehab facility at time of the two week follow up appointment, the skilled rehab facility will also need to assist the patient in arranging follow up appointment in our office and any transportation needs.  MAKE SURE YOU:  Understand these instructions.  Get help right away if you are not doing well or get worse.    Pick up stool softner and laxative for home use following surgery while on pain medications. Do not submerge incision under water. Please use good hand washing techniques while changing dressing each day. May shower starting three days after surgery. Please use a clean towel to pat the incision dry following showers. Continue to use ice for  pain and swelling after surgery. Do not use any lotions or creams on the incision until instructed by your surgeon.  Take Coumadin for three weeks and then discontinue.  The dose may need to be adjusted based upon the INR.  Please follow the INR and titrate Coumadin dose for a therapeutic range between 2.0 and  3.0 INR.  After completing the three weeks of Coumadin, the patient may stop the Coumadin and resume their 81 mg Aspirin daily.  Continue Lovenox injections until the INR is therapeutic at or greater than 2.0.  When INR reaches the therapeutic level of equal to or greater than 2.0, the patient may discontinue the Lovenox injections.    Information on my medicine - Coumadin   (Warfarin)  This medication education was reviewed with me or my healthcare representative as part of my discharge preparation.    Why was Coumadin prescribed for you? Coumadin was prescribed for you because you have a blood clot or a medical condition that can cause an increased risk of forming blood clots. Blood clots can cause serious health problems by blocking the flow of blood to the heart, lung, or brain. Coumadin can prevent harmful blood clots from forming. As a reminder your indication for Coumadin is:   Blood Clot Prevention After Orthopedic Surgery  What test will check on my response to Coumadin? While on Coumadin (warfarin) you will need to have an INR test regularly to ensure that your dose is keeping you in the desired range. The INR (international normalized ratio) number is calculated from the result of the laboratory test called prothrombin time (PT).  If an INR APPOINTMENT HAS NOT ALREADY BEEN MADE FOR YOU please schedule an appointment to have this lab work done by your health care provider within 7 days. Your INR goal is a number between:  2 to 3   What  do you need to  know  About  COUMADIN? Take Coumadin (warfarin) exactly as prescribed by your healthcare provider about the same time each  day.  DO NOT stop taking without talking to the doctor who prescribed the medication.  Stopping without other blood clot prevention medication to take the place of Coumadin may increase your risk of developing a new clot or stroke.  Get refills before you run out.  What do you do if you miss a dose? If you miss a dose, take it as soon as you remember on the same day then continue your regularly scheduled regimen the next day.  Do not take two doses of Coumadin at the same time.  Important Safety Information A possible side effect of Coumadin (Warfarin) is an increased risk of bleeding. You should call your healthcare provider right away if you experience any of the following: ? Bleeding from an injury or your nose that does not stop. ? Unusual colored urine (red or dark brown) or unusual colored stools (red or black). ? Unusual bruising for unknown reasons. ? A serious fall or if you hit your head (even if there is no bleeding).  Some foods or medicines interact with Coumadin (warfarin) and might alter your response to warfarin. To help avoid this: ? Eat a balanced diet, maintaining a consistent amount of Vitamin K. ? Notify your provider about major diet changes you plan to make. ? Avoid alcohol or limit your intake to 1 drink for women and 2 drinks for men per day. (1 drink is 5 oz. wine, 12 oz. beer, or 1.5 oz. liquor.)  Make sure that ANY health care provider who prescribes medication for you knows that you are taking Coumadin (warfarin).  Also make sure the healthcare provider who is monitoring your Coumadin knows when you have started a new medication including herbals and non-prescription products.  Coumadin (Warfarin)  Major Drug Interactions  Increased Warfarin Effect Decreased Warfarin Effect  Alcohol (large quantities) Antibiotics (esp. Septra/Bactrim, Flagyl, Cipro) Amiodarone (Cordarone) Aspirin (ASA) Cimetidine (Tagamet) Megestrol (Megace) NSAIDs (ibuprofen, naproxen,  etc.) Piroxicam (Feldene) Propafenone (Rythmol SR) Propranolol (Inderal) Isoniazid (INH) Posaconazole (Noxafil) Barbiturates (Phenobarbital) Carbamazepine (Tegretol) Chlordiazepoxide (Librium) Cholestyramine (Questran) Griseofulvin Oral Contraceptives Rifampin Sucralfate (Carafate) Vitamin K   Coumadin (Warfarin) Major Herbal Interactions  Increased Warfarin Effect Decreased Warfarin Effect  Garlic Ginseng Ginkgo biloba Coenzyme Q10 Green tea St. Johns wort    Coumadin (Warfarin) FOOD Interactions  Eat a consistent number of servings per week of foods HIGH in Vitamin K (1 serving =  cup)  Collards (cooked, or boiled & drained) Kale (cooked, or boiled & drained) Mustard greens (cooked, or boiled & drained) Parsley *serving size only =  cup Spinach (cooked, or boiled & drained) Swiss chard (cooked, or boiled & drained) Turnip greens (cooked, or boiled & drained)  Eat a consistent number of servings per week of foods MEDIUM-HIGH in Vitamin K (1 serving = 1 cup)  Asparagus (cooked, or boiled & drained) Broccoli (cooked, boiled & drained, or raw & chopped) Brussel sprouts (cooked, or boiled & drained) *serving size only =  cup Lettuce, raw (green leaf, endive, romaine) Spinach, raw Turnip greens, raw & chopped   These websites have more information on Coumadin (warfarin):  FailFactory.se; VeganReport.com.au;

## 2017-05-28 NOTE — Progress Notes (Signed)
[place] for warfarin Indication: VTE prophylaxis status post TKA   Allergies  Allergen Reactions  . Adhesive [Tape] Other (See Comments)    Blisters; "gets puffy and red" Liquid Bandaids    Patient Measurements: Height: 5\' 5"  (165.1 cm) Weight: 148 lb (67.1 kg) IBW/kg (Calculated) : 57  Vital Signs: Temp: 97.7 F (36.5 C) (02/14 0549) Temp Source: Oral (02/14 0549) BP: 110/56 (02/14 0549) Pulse Rate: 76 (02/14 0549)  Labs: Recent Labs    05/28/17 0620  HGB 10.7*  HCT 31.6*  PLT 243  LABPROT 13.6  INR 1.05  CREATININE 0.57    Estimated Creatinine Clearance: 58.9 mL/min (by C-G formula based on SCr of 0.57 mg/dL).   Medical History: Past Medical History:  Diagnosis Date  . Anxiety   . Carpal tunnel syndrome on both sides   . Chronic back pain    herniated disc,stenosis,radiculopathy,spondylolisthesis  . Chronic neck pain   . Constipation, chronic   . Degenerative spinal arthritis    cervical and lumbar  . Depression   . Eczema   . GERD (gastroesophageal reflux disease)   . Hemorrhoids   . Hiatal hernia   . History of bronchitis    "I've had it a number of times"  . IBS (irritable bowel syndrome)    lactose intolerant  . Osteoarthritis    bilateral knees and shoulders,  little fingers  . TMJ (dislocation of temporomandibular joint)   . Wears glasses     Assessment: 55 YOF with history of BL osteoarthritis of the knees. Presented to [facility] on 2/13 for BL TKA. Patient not on anticoagulation prior to admission. Coumadin ordered post op for VTE prophylaxis. Ortho team plans to continue with Coumadin for 4 weeks, then transition to aspirin 81mg  daily.  05/28/17 - Currently has epidural in place, plan to remove 2/15 in evening.   - 2/14 INR 1.05, Hgb 10.7 - first dose of Coumadin given this morning 0800 - no bleeding documented - no significant DDI - regular diet  Goal of Therapy:  INR 2-3  Plan:   No  additional warfarin dose needed today since first dose given this morning  Daily INR  Monitor CBC  Monitor for signs and symptoms of bleeding  Follow-up plan for removal of epidural  Ladoris Gene, PharmD Candidate 05/28/2017

## 2017-05-28 NOTE — Progress Notes (Signed)
Subjective: 1 Day Post-Op Procedure(s) (LRB): TOTAL KNEE BILATERAL (Bilateral) Patient reports pain as mild.   Patient seen in rounds by Dr. Wynelle Link. Patient is well, and has had no acute complaints or problems We will start therapy today. She sill has her intrathecal catheter in place.  Motor function noted to have returned on morning rounds.  Plan is to go Rehab after hospital stay.  Objective: Vital signs in last 24 hours: Temp:  [97.4 F (36.3 C)-97.9 F (36.6 C)] 97.7 F (36.5 C) (02/14 0549) Pulse Rate:  [64-92] 76 (02/14 0549) Resp:  [9-16] 16 (02/14 0549) BP: (97-123)/(56-83) 110/56 (02/14 0549) SpO2:  [97 %-100 %] 100 % (02/14 0549)  Intake/Output from previous day:  Intake/Output Summary (Last 24 hours) at 05/28/2017 0853 Last data filed at 05/28/2017 0800 Gross per 24 hour  Intake 5118.97 ml  Output 2428 ml  Net 2690.97 ml    Intake/Output this shift: Total I/O In: 360 [P.O.:360] Out: -   Labs: Recent Labs    05/28/17 0620  HGB 10.7*   Recent Labs    05/28/17 0620  WBC 12.7*  RBC 3.33*  HCT 31.6*  PLT 243   Recent Labs    05/28/17 0620  NA 139  K 3.9  CL 105  CO2 23  BUN 10  CREATININE 0.57  GLUCOSE 133*  CALCIUM 8.2*   Recent Labs    05/28/17 0620  INR 1.05    EXAM General - Patient is Alert and Appropriate Extremity - Neurovascular intact Sensation intact distally Dressing - dressing C/D/I Motor Function - intact, moving feet and toes well on exam.  Both Hemovacs pulled without difficulty.  Past Medical History:  Diagnosis Date  . Anxiety   . Carpal tunnel syndrome on both sides   . Chronic back pain    herniated disc,stenosis,radiculopathy,spondylolisthesis  . Chronic neck pain   . Constipation, chronic   . Degenerative spinal arthritis    cervical and lumbar  . Depression   . Eczema   . GERD (gastroesophageal reflux disease)   . Hemorrhoids   . Hiatal hernia   . History of bronchitis    "I've had it a number of  times"  . IBS (irritable bowel syndrome)    lactose intolerant  . Osteoarthritis    bilateral knees and shoulders,  little fingers  . TMJ (dislocation of temporomandibular joint)   . Wears glasses     Assessment/Plan: 1 Day Post-Op Procedure(s) (LRB): TOTAL KNEE BILATERAL (Bilateral) Principal Problem:   OA (osteoarthritis) of knee  Estimated body mass index is 24.63 kg/m as calculated from the following:   Height as of this encounter: 5\' 5"  (1.651 m).   Weight as of this encounter: 67.1 kg (148 lb). Up with therapy Continue foley for now.  Will keep foley until tomorrow and will not be removed until at least 6-8 hours following the removal of the epidural catheter.  DVT Prophylaxis - Coumadin, Lovenox will not start until tomorrow afternoon following removal of the intrathecal catheter. First dose of Coumadin today. Weight-Bearing as tolerated to both leg  Continue O2 and Pulse OX   Take Coumadin for four weeks and then discontinue.  The dose may need to be adjusted based upon the INR.  Please follow the INR and titrate Coumadin dose for a therapeutic range between 2.0 and 3.0 INR.  After completing the four weeks of Coumadin, the patient may stop the Coumadin and resume their 81 mg Aspirin daily.  Lovenox injections will  start tomorrow evening after the epidural has been removed and continue until the INR is therapeutic at or greater than 2.0.  When INR reaches the therapeutic level of equal to or greater than 2.0, the patient may discontinue the Lovenox injections.  Inpatient Rehab consult ordered.  Arlee Muslim, PA-C Orthopaedic Surgery 05/28/2017, 8:53 AM

## 2017-05-29 LAB — CBC
HCT: 28.4 % — ABNORMAL LOW (ref 36.0–46.0)
Hemoglobin: 9.8 g/dL — ABNORMAL LOW (ref 12.0–15.0)
MCH: 32.9 pg (ref 26.0–34.0)
MCHC: 34.5 g/dL (ref 30.0–36.0)
MCV: 95.3 fL (ref 78.0–100.0)
Platelets: 217 10*3/uL (ref 150–400)
RBC: 2.98 MIL/uL — ABNORMAL LOW (ref 3.87–5.11)
RDW: 13.1 % (ref 11.5–15.5)
WBC: 12.2 10*3/uL — ABNORMAL HIGH (ref 4.0–10.5)

## 2017-05-29 LAB — BASIC METABOLIC PANEL
Anion gap: 8 (ref 5–15)
BUN: 13 mg/dL (ref 6–20)
CO2: 26 mmol/L (ref 22–32)
Calcium: 8.4 mg/dL — ABNORMAL LOW (ref 8.9–10.3)
Chloride: 106 mmol/L (ref 101–111)
Creatinine, Ser: 0.61 mg/dL (ref 0.44–1.00)
GFR calc Af Amer: >60 mL/min (ref >60)
GFR calc non Af Amer: >60 mL/min (ref >60)
Glucose, Bld: 116 mg/dL — ABNORMAL HIGH (ref 65–99)
Potassium: 3.8 mmol/L (ref 3.5–5.1)
Sodium: 140 mmol/L (ref 135–145)

## 2017-05-29 LAB — PROTIME-INR
INR: 1.08
Prothrombin Time: 13.9 seconds (ref 11.4–15.2)

## 2017-05-29 MED ORDER — DIAZEPAM 5 MG PO TABS
5.0000 mg | ORAL_TABLET | Freq: Four times a day (QID) | ORAL | Status: DC | PRN
  Administered 2017-05-29 – 2017-05-30 (×2): 10 mg via ORAL
  Administered 2017-05-30 – 2017-06-02 (×8): 5 mg via ORAL
  Filled 2017-05-29 (×3): qty 1
  Filled 2017-05-29 (×2): qty 2
  Filled 2017-05-29 (×5): qty 1

## 2017-05-29 MED ORDER — MORPHINE SULFATE (PF) 2 MG/ML IV SOLN
1.0000 mg | INTRAVENOUS | Status: DC | PRN
  Administered 2017-05-29 (×2): 2 mg via INTRAVENOUS
  Filled 2017-05-29 (×2): qty 1

## 2017-05-29 MED ORDER — SODIUM CHLORIDE 0.9 % IV BOLUS (SEPSIS)
250.0000 mL | Freq: Once | INTRAVENOUS | Status: AC
  Administered 2017-05-29: 09:00:00 via INTRAVENOUS

## 2017-05-29 MED ORDER — MORPHINE SULFATE (PF) 2 MG/ML IV SOLN
1.0000 mg | INTRAVENOUS | Status: DC | PRN
  Administered 2017-05-29 – 2017-05-30 (×3): 2 mg via INTRAVENOUS
  Filled 2017-05-29 (×5): qty 1

## 2017-05-29 MED ORDER — DIAZEPAM 5 MG PO TABS
5.0000 mg | ORAL_TABLET | Freq: Four times a day (QID) | ORAL | Status: DC | PRN
  Administered 2017-05-29: 5 mg via ORAL
  Filled 2017-05-29: qty 1

## 2017-05-29 MED ORDER — OXYCODONE HCL 5 MG PO TABS
10.0000 mg | ORAL_TABLET | ORAL | Status: DC | PRN
  Administered 2017-05-29 – 2017-05-30 (×5): 20 mg via ORAL
  Administered 2017-05-31: 10 mg via ORAL
  Administered 2017-05-31: 15 mg via ORAL
  Administered 2017-05-31 (×2): 10 mg via ORAL
  Administered 2017-05-31 – 2017-06-01 (×2): 20 mg via ORAL
  Administered 2017-06-01: 10 mg via ORAL
  Administered 2017-06-01: 15 mg via ORAL
  Administered 2017-06-02: 10 mg via ORAL
  Filled 2017-05-29 (×2): qty 4
  Filled 2017-05-29: qty 2
  Filled 2017-05-29: qty 3
  Filled 2017-05-29: qty 2
  Filled 2017-05-29: qty 3
  Filled 2017-05-29: qty 2
  Filled 2017-05-29 (×2): qty 4
  Filled 2017-05-29 (×3): qty 2
  Filled 2017-05-29 (×3): qty 4

## 2017-05-29 MED ORDER — WARFARIN SODIUM 4 MG PO TABS
4.0000 mg | ORAL_TABLET | Freq: Once | ORAL | Status: AC
  Administered 2017-05-29: 4 mg via ORAL
  Filled 2017-05-29 (×2): qty 1

## 2017-05-29 MED ORDER — OXYCODONE HCL 5 MG PO TABS
10.0000 mg | ORAL_TABLET | ORAL | Status: DC | PRN
  Administered 2017-05-29 (×2): 15 mg via ORAL
  Filled 2017-05-29 (×2): qty 3

## 2017-05-29 MED ORDER — ENOXAPARIN SODIUM 30 MG/0.3ML ~~LOC~~ SOLN
30.0000 mg | Freq: Two times a day (BID) | SUBCUTANEOUS | Status: DC
  Administered 2017-05-30 – 2017-06-01 (×5): 30 mg via SUBCUTANEOUS
  Filled 2017-05-29 (×5): qty 0.3

## 2017-05-29 NOTE — Progress Notes (Signed)
Physical Therapy Treatment Patient Details Name: Sherry [name] MRN: [id number] DOB: 07-Apr-1946 Today's Date: 05/29/2017    History of Present Illness S/P bil TKA    PT Comments    POD # 2 pm session Assisted out of recliner back to bed then performed some TKR TE's followed by ICE. Epidural was removed just prior to session.  Noted bloody serous drainage on pillow near site and still bleeding/oozing clear liquid.  Applied pressure and alerted RN. Assisted back to bed and performed some TKR TE's.    Follow Up Recommendations  CIR     Equipment Recommendations  Rolling walker with 5" wheels    Recommendations for Other Services Rehab consult     Precautions / Restrictions Precautions Precautions: Knee;Fall Precaution Comments: epidural, monitor BP Required Braces or Orthoses: Knee Immobilizer - Right;Knee Immobilizer - Left Knee Immobilizer - Right: Discontinue once straight leg raise with < 10 degree lag Knee Immobilizer - Left: Discontinue once straight leg raise with < 10 degree lag Restrictions Weight Bearing Restrictions: No Other Position/Activity Restrictions: WBAT    Mobility  Bed Mobility Overal bed mobility: Needs Assistance Bed Mobility: Sit to Supine     Supine to sit: Min assist Sit to supine: Mod assist;+2 for safety/equipment   General bed mobility comments: assisted back to bed + 2 assist   Transfers Overall transfer level: Needs assistance Equipment used: Rolling walker (2 wheeled) Transfers: Sit to/from Stand Sit to Stand: Mod assist;+2 physical assistance;+2 safety/equipment         General transfer comment: assisted out of recliner required increased assist due to lower surface level.    Ambulation/Gait Ambulation/Gait assistance: Mod assist;+2 physical assistance;+2 safety/equipment Ambulation Distance (Feet): 2 Feet Assistive device: Rolling walker (2 wheeled) Gait Pattern/deviations: Step-to pattern Gait velocity: decreased    General Gait Details: stepping backward to bed with MAX c/o fatigue and feeling "woozy".   Stairs            Wheelchair Mobility    Modified Rankin (Stroke Patients Only)       Balance                                            Cognition Arousal/Alertness: Awake/alert Behavior During Therapy: WFL for tasks assessed/performed Overall Cognitive Status: Within Functional Limits for tasks assessed                                        Exercises   Total Knee Replacement TE's 10 reps B LE ankle pumps 10 reps towel squeezes 10 reps knee presses 10 reps heel slides  10 reps SLR's 10 reps ABD Followed by ICE     General Comments        Pertinent Vitals/Pain Pain Score: 9  Pain Location: R knee > L knee Pain Descriptors / Indicators: Aching;Discomfort;Operative site guarding Pain Intervention(s): Monitored during session;Repositioned;Ice applied;Premedicated before session    Home Living                      Prior Function            PT Goals (current goals can now be found in the care plan section) Progress towards PT goals: Progressing toward goals    Frequency    7X/week  PT Plan Current plan remains appropriate    Co-evaluation PT/OT/SLP Co-Evaluation/Treatment: Yes            AM-PAC PT "6 Clicks" Daily Activity  Outcome Measure  Difficulty turning over in bed (including adjusting bedclothes, sheets and blankets)?: Unable Difficulty moving from lying on back to sitting on the side of the bed? : Unable Difficulty sitting down on and standing up from a chair with arms (e.g., wheelchair, bedside commode, etc,.)?: Unable Help needed moving to and from a bed to chair (including a wheelchair)?: Total Help needed walking in hospital room?: Total Help needed climbing 3-5 steps with a railing? : Total 6 Click Score: 6    End of Session Equipment Utilized During Treatment: Gait belt;Right knee  immobilizer;Left knee immobilizer Activity Tolerance: Patient tolerated treatment well Patient left: in chair;with call bell/phone within reach Nurse Communication: Mobility status PT Visit Diagnosis: Unsteadiness on feet (R26.81);Pain Pain - Right/Left: Right Pain - part of body: Knee     Time: 1340-1405 PT Time Calculation (min) (ACUTE ONLY): 25 min  Charges:  $Therapeutic Exercise: 8-22 mins $Therapeutic Activity: 8-22 mins                    G Codes:       Rica Koyanagi  PTA [facility]  Acute  Rehab Pager      [phone]

## 2017-05-29 NOTE — Progress Notes (Addendum)
Subjective: 2 Days Post-Op Procedure(s) (LRB): TOTAL KNEE BILATERAL (Bilateral) Patient reports pain as mild.   Patient seen in rounds for Dr. Wynelle Link.  The left leg is doing great but pain in the right leg Denies N/V/CP/SOB Patient is well, and has had no acute complaints or problems Intrathecal catheter to come out today.  Anticipate increase in pain temporarily after the catherter has been removed. Plan is to go [facility] after hospital stay. Rehab coordinator to come by later this morning.  Objective: Vital signs in last 24 hours: Temp:  [97.5 F (36.4 C)-98.6 F (37 C)] 98.3 F (36.8 C) (02/15 0625) Pulse Rate:  [79-90] 83 (02/15 0625) Resp:  [12-16] 13 (02/15 0625) BP: (115-121)/(59-72) 120/59 (02/15 0625) SpO2:  [92 %-100 %] 93 % (02/15 0625)  Intake/Output from previous day:  Intake/Output Summary (Last 24 hours) at 05/29/2017 0849 Last data filed at 05/29/2017 0600 Gross per 24 hour  Intake 2683.33 ml  Output 4050 ml  Net -1366.67 ml    Intake/Output this shift: No intake/output data recorded.  Labs: Recent Labs    05/28/17 0620 05/29/17 0547  HGB 10.7* 9.8*   Recent Labs    05/28/17 0620 05/29/17 0547  WBC 12.7* 12.2*  RBC 3.33* 2.98*  HCT 31.6* 28.4*  PLT 243 217   Recent Labs    05/28/17 0620 05/29/17 0547  NA 139 140  K 3.9 3.8  CL 105 106  BUN 10 13  CREATININE 0.57 0.61  GLUCOSE 133* 116*  CALCIUM 8.2* 8.4*   Recent Labs    05/28/17 0620 05/29/17 0547  INR 1.05 1.08    EXAM General - Patient is Alert, Appropriate and Oriented Extremity - Neurovascular intact Sensation intact distally Intact pulses distally Dorsiflexion/Plantar flexion intact to both legs Dressing/Incision - clean, dry, no drainage to both incisions Motor Function - intact, moving feet and toes well on exam.   Foley catheter still in place - Do Not Remove until later this evening   Past Medical History:  Diagnosis Date  . Anxiety   . Carpal tunnel  syndrome on both sides   . Chronic back pain    herniated disc,stenosis,radiculopathy,spondylolisthesis  . Chronic neck pain   . Constipation, chronic   . Degenerative spinal arthritis    cervical and lumbar  . Depression   . Eczema   . GERD (gastroesophageal reflux disease)   . Hemorrhoids   . Hiatal hernia   . History of bronchitis    "I've had it a number of times"  . IBS (irritable bowel syndrome)    lactose intolerant  . Osteoarthritis    bilateral knees and shoulders,  little fingers  . TMJ (dislocation of temporomandibular joint)   . Wears glasses     Assessment/Plan: 2 Days Post-Op Procedure(s) (LRB): TOTAL KNEE BILATERAL (Bilateral) Principal Problem:   OA (osteoarthritis) of knee  Estimated body mass index is 24.63 kg/m as calculated from the following:   Height as of this encounter: 5\' 5"  (1.651 m).   Weight as of this encounter: 67.1 kg (148 lb). Up with therapy Continue foley due to urinary output monitoring and they still has their epidural in place.  Will remove the catheter six hours after the epidural is removed.  Will need to note in chart when the epidural is pulled.  DVT Prophylaxis - Coumadin, Lovenox will not start until later this afternoon though after the epidural has been removed for twelve hours.  Will need to note in chart when  the epidural is pulled. Anticipate increase in pain temporarily after the epidural has been removed.  Weight-Bearing as tolerated to both legs  Take Coumadin for four weeks and then discontinue.  The dose may need to be adjusted based upon the INR.  Please follow the INR and titrate Coumadin dose for a therapeutic range between 2.0 and 3.0 INR.  After completing the four weeks of Coumadin, the patient may stop the Coumadin and resume their 81 mg Aspirin daily.  Lovenox injections will start later this evening after the epidural has been removed and continue until the INR is therapeutic at or greater than 2.0.  When INR  reaches the therapeutic level of equal to or greater than 2.0, the patient may discontinue the Lovenox injections.  INR - 1.08 this morning  Arlee Muslim, PA-C Orthopaedic Surgery 05/29/2017, 8:49 AM

## 2017-05-29 NOTE — Anesthesia Post-op Follow-up Note (Signed)
  Anesthesia Pain Follow-up Note  Patient: Sherry [name]  Day #  3  Date of Follow-up: 05/29/2017 Time: 1:44 PM  Last Vitals:  Vitals:   05/29/17 0625 05/29/17 1008  BP: (!) 120/59 (!) 160/71  Pulse: 83 95  Resp: 13 14  Temp: 36.8 C 36.6 C  SpO2: 93% 98%    Level of Consciousness: alert  Pain: mild   Side Effects:None  Catheter Site Exam:clean  Anti-Coag Meds (From admission, onward)   Start     Dose/Rate Route Frequency Ordered Stop   05/29/17 1800  warfarin (COUMADIN) tablet 4 mg     4 mg Oral One time only - 1800 05/29/17 1339 05/30/17 1759   05/28/17 1800  Warfarin - Pharmacist Dosing Inpatient      Does not apply Daily-1800 05/27/17 1617         Plan: Catheter removed/tip intact at surgeon's request  Christin Moline

## 2017-05-29 NOTE — Progress Notes (Signed)
[place] for warfarin Indication: VTE prophylaxis status post TKA   Allergies  Allergen Reactions  . Adhesive [Tape] Other (See Comments)    Blisters; "gets puffy and red" Liquid Bandaids    Patient Measurements: Height: 5\' 5"  (165.1 cm) Weight: 148 lb (67.1 kg) IBW/kg (Calculated) : 57  Vital Signs: Temp: 97.8 F (36.6 C) (02/15 1008) Temp Source: Oral (02/15 1008) BP: 160/71 (02/15 1008) Pulse Rate: 95 (02/15 1008)  Labs: Recent Labs    05/28/17 0620 05/29/17 0547  HGB 10.7* 9.8*  HCT 31.6* 28.4*  PLT 243 217  LABPROT 13.6 13.9  INR 1.05 1.08  CREATININE 0.57 0.61    Estimated Creatinine Clearance: 58.9 mL/min (by C-G formula based on SCr of 0.61 mg/dL).   Medical History: Past Medical History:  Diagnosis Date  . Anxiety   . Carpal tunnel syndrome on both sides   . Chronic back pain    herniated disc,stenosis,radiculopathy,spondylolisthesis  . Chronic neck pain   . Constipation, chronic   . Degenerative spinal arthritis    cervical and lumbar  . Depression   . Eczema   . GERD (gastroesophageal reflux disease)   . Hemorrhoids   . Hiatal hernia   . History of bronchitis    "I've had it a number of times"  . IBS (irritable bowel syndrome)    lactose intolerant  . Osteoarthritis    bilateral knees and shoulders,  little fingers  . TMJ (dislocation of temporomandibular joint)   . Wears glasses     Assessment: 7 YOF with history of BL osteoarthritis of the knees. Presented to [facility] on 2/13 for BL TKA. Patient not on anticoagulation prior to admission. Coumadin ordered post op for VTE prophylaxis. Ortho team plans to continue with Coumadin for 4 weeks, then transition to aspirin 81mg  daily.  Today, 05/29/2017: - INR = 1.08 (with 2.5 mg dose given on 2/14) - hgb down slightly to 9.8, plts down 217 - no bleeding documented - no significant drug-drug intxns - regular diet - Pt's RN Anderson Malta) called at  1:37PM to report that epidural has been removed  Goal of Therapy:  INR 2-3  Plan:  - warfarin 4 mg PO x1 today - daily INR - Monitor for signs and symptoms of bleeding   Dia Sitter, PharmD, BCPS 05/29/2017 10:38 AM

## 2017-05-29 NOTE — H&P (Signed)
Physical Medicine and Rehabilitation Admission H&P    Chief complaint: Knee pain  HPI: 72 year old right-handed female with history of chronic back and neck pain as well as osteoarthritis degenerative changes bilateral knees. Per chart review patient lives with spouse. Independent prior to admission and active. She takes care of her grandchildren. Two-level home with 2 steps to entry. Bed and bath are upstairs. Presented 05/27/2017 with progressive knee pain bilaterally. No relief with cortisone injections exercise modifications. Underwent bilateral total knee arthroplasty 05/27/2017 per Dr. Maureen Ralphs. Hospital course pain management. Patient initially had an epidural in place. Weightbearing as tolerated bilateral lower extremities. Placed on Coumadin for DVT prophylaxis. Acute blood loss anemia 8.5 monitored. Physical and occupational therapy evaluations completed with recommendations of physical medicine rehabilitation consult and patient was admitted for a comprehensive rehabilitation program.  Review of Systems  Constitutional: Negative for chills and fever.  HENT: Negative for hearing loss.   Eyes: Negative for blurred vision and double vision.  Respiratory: Negative for cough and shortness of breath.   Cardiovascular: Negative for chest pain, palpitations and leg swelling.  Gastrointestinal: Positive for constipation. Negative for nausea and vomiting.       GERD  Genitourinary: Negative for dysuria, flank pain and hematuria.  Musculoskeletal: Positive for back pain and neck pain.  Psychiatric/Behavioral: Positive for depression.       Anxiety  All other systems reviewed and are negative.  Past Medical History:  Diagnosis Date  . Anxiety   . Carpal tunnel syndrome on both sides   . Chronic back pain    herniated disc,stenosis,radiculopathy,spondylolisthesis  . Chronic neck pain   . Constipation, chronic   . Degenerative spinal arthritis    cervical and lumbar  . Depression   .  Eczema   . GERD (gastroesophageal reflux disease)   . Hemorrhoids   . Hiatal hernia   . History of bronchitis    "I've had it a number of times"  . IBS (irritable bowel syndrome)    lactose intolerant  . Osteoarthritis    bilateral knees and shoulders,  little fingers  . TMJ (dislocation of temporomandibular joint)   . Wears glasses    Past Surgical History:  Procedure Laterality Date  . APPENDECTOMY  1967  . BLADDER SUSPENSION  1980's   sling  . BREAST BIOPSY Left 1980's   "twice on the left"  . BREAST REDUCTION SURGERY Bilateral 1988  . CERVIX LESION DESTRUCTION  age 75s  . DEBRIDEMENT COMPLEX MUCOID CYST, ARTHROTOMY W/ SYNOVECTOMY Left 07-21-2008  dr sypher [facility]   Left long finger  . DILATION AND CURETTAGE OF UTERUS  1988  . INCISION AND DRAINAGE ABSCESS / HEMATOMA OF BURSA / KNEE / [place]   "day after knee scope"  . KNEE ARTHROSCOPY  1994   unilateral  . POSTERIOR FUSION LUMBAR SPINE  02/2011   L3-L5  . TONSILLECTOMY  1951  . TOTAL KNEE ARTHROPLASTY Bilateral 05/27/2017   Procedure: TOTAL KNEE BILATERAL;  Surgeon: Gaynelle Arabian, MD;  Location: [facility];  Service: Orthopedics;  Laterality: Bilateral;   Family History  Problem Relation Age of Onset  . Kidney disease Father   . Rheum arthritis Father   . Parkinson's disease Mother   . Leukemia Sister   . Colon cancer Maternal Grandfather        questionable  . Breast cancer Paternal Aunt   . Anesthesia problems Neg Hx   . Hypotension Neg Hx   . Malignant hyperthermia Neg Hx   .  Pseudochol deficiency Neg Hx    Social History:  reports that she quit smoking about 49 years ago. Her smoking use included cigarettes. She quit after 4.00 years of use. she has never used smokeless tobacco. She reports that she drinks about 3.6 oz of alcohol per week. She reports that she does not use drugs. Allergies:  Allergies  Allergen Reactions  . Adhesive [Tape] Other (See Comments)    Blisters; "gets puffy and red" Liquid  Bandaids   Medications Prior to Admission  Medication Sig Dispense Refill  . B Complex Vitamins (VITAMIN B COMPLEX PO) Take 1 tablet by mouth daily.    Marland Kitchen dicyclomine (BENTYL) 10 MG capsule Take 10 mg by mouth 3 (three) times daily as needed for spasms.    . ergocalciferol (VITAMIN D2) 50000 units capsule Take 50,000 Units by mouth every Monday.    Marland Kitchen omeprazole (PRILOSEC) 20 MG capsule Take 20 mg by mouth as needed.    . polyethylene glycol (MIRALAX) packet Take 17 g by mouth 3 (three) times daily. Until bowels move; maximum 2 consecutive days (Patient taking differently: Take 17 g by mouth every other day. Until bowels move; maximum 2 consecutive days) 6 each 0  . Probiotic Product (PROBIOTIC DAILY PO) Take 1 tablet by mouth every other day.     . famotidine (PEPCID) 20 MG tablet Take 20 mg by mouth as needed for heartburn or indigestion.    . fexofenadine (ALLEGRA) 180 MG tablet Take 180 mg by mouth as needed.       Drug Regimen Review Drug regimen was reviewed and remains appropriate with no significant issues identified  Home: Home Living Family/patient expects to be discharged to:: Inpatient rehab Living Arrangements: Spouse/significant other Available Help at Discharge: Family Type of Home: House Home Access: Stairs to enter Technical brewer of Steps: 2 Home Layout: Two level Alternate Level Stairs-Number of Steps: 13 Alternate Level Stairs-Rails: Right Bathroom Shower/Tub: Multimedia programmer: Standard Home Equipment: Environmental consultant - 2 wheels, Shower seat, Adaptive equipment, Grab bars - toilet, Grab bars - tub/shower Additional Comments: bed and bath upstairs. Walk in shower with seat. HH shower, higher commodes   Functional History: Prior Function Level of Independence: Independent Comments: cares for grandchildren. Could not carry baby upstairs  Functional Status:  Mobility: Bed Mobility Overal bed mobility: Needs Assistance Bed Mobility: Supine to Sit,  Sit to Supine Supine to sit: Min assist Sit to supine: Min assist General bed mobility comments: patient used leg lifter to self assist the legs, cus to use UE's once  feet over bed edge. Transfers Overall transfer level: Needs assistance Equipment used: Rolling walker (2 wheeled) Transfers: Sit to/from Stand Sit to Stand: Mod assist, +2 physical assistance, +2 safety/equipment General transfer comment: from very high bed; assist to rise and stabilize. Cues for walking feet forward and backwards and cues for UE placement Ambulation/Gait Ambulation/Gait assistance: Mod assist, +2 physical assistance, +2 safety/equipment Ambulation Distance (Feet): 4 Feet Assistive device: Rolling walker (2 wheeled) Gait Pattern/deviations: Step-to pattern General Gait Details: side steps along the bed, cues for technique.     ADL: ADL Overall ADL's : Needs assistance/impaired Eating/Feeding: Independent Grooming: Set up, Sitting Upper Body Bathing: Set up, Sitting Lower Body Bathing: Maximal assistance, +2 for physical assistance, Sit to/from stand Upper Body Dressing : Minimal assistance, Sitting(lines) Lower Body Dressing: Total assistance, +2 for physical assistance, Sit to/from stand General ADL Comments: pt has epidural in place. Stood but orthostatic:  see vitals section of chart.  Educated on concept of AE. She has a sock aide at home.    Cognition: Cognition Overall Cognitive Status: Within Functional Limits for tasks assessed Orientation Level: Oriented X4 Cognition Arousal/Alertness: Awake/alert Behavior During Therapy: WFL for tasks assessed/performed Overall Cognitive Status: Within Functional Limits for tasks assessed  Physical Exam: Blood pressure (!) 120/59, pulse 83, temperature 98.3 F (36.8 C), temperature source Oral, resp. rate 13, height _0  (1.651 m), weight 67.1 kg (148 lb), last menstrual period 04/14/1997, SpO2 93 %. Physical Exam  Vitals reviewed. Constitutional:  She appears well-developed.  HENT:  Head: Normocephalic.  Eyes: EOM are normal. Right eye exhibits no discharge. Left eye exhibits no discharge.  Neck: Normal range of motion. Neck supple. No thyromegaly present.  Cardiovascular: Normal rate, regular rhythm and normal heart sounds.  Respiratory: Effort normal and breath sounds normal. No respiratory distress.  GI: Soft. Bowel sounds are normal. She exhibits no distension. There is no tenderness.  hy pitched bowels sounds, decr bowel sounds.   Skin. Warm and dry bilateral knee incisions with some erythema along left knee incision.  no drainage noted.  Musc: both knees Appropriately tender. Right Knee with 75-80 degrees flexion PROM, left knee 60-65 flexion Neurological. Patient is a bit lethargic but makes good eye contact with examiner. Follows full commands. DTR symmetrical. Upper extremities 5 out of 5. Bilateral lower extremities hip flexors knee extension limited by pain. Ankle dorsi plantar flexion 4+ out of 5 bilaterally.       Results for orders placed or performed during the hospital encounter of 05/27/17 (from the past 48 hour(s))  CBC     Status: Abnormal   Collection Time: 05/28/17  6:20 AM  Result Value Ref Range   WBC 12.7 (H) 4.0 - 10.5 K/uL   RBC 3.33 (L) 3.87 - 5.11 MIL/uL   Hemoglobin 10.7 (L) 12.0 - 15.0 g/dL   HCT 31.6 (L) 36.0 - 46.0 %   MCV 94.9 78.0 - 100.0 fL   MCH 32.1 26.0 - 34.0 pg   MCHC 33.9 30.0 - 36.0 g/dL   RDW 12.8 11.5 - 15.5 %   Platelets 243 150 - 400 K/uL    Comment: Performed at [facility], [place] [address] [place], [place] [address]  Basic metabolic panel     Status: Abnormal   Collection Time: 05/28/17  6:20 AM  Result Value Ref Range   Sodium 139 135 - 145 mmol/L   Potassium 3.9 3.5 - 5.1 mmol/L   Chloride 105 101 - 111 mmol/L   CO2 23 22 - 32 mmol/L   Glucose, Bld 133 (H) 65 - 99 mg/dL   BUN 10 6 - 20 mg/dL   Creatinine, Ser 0.57 0.44 - 1.00 mg/dL   Calcium 8.2 (L)  8.9 - 10.3 mg/dL   GFR calc non Af Amer >60 >60 mL/min   GFR calc Af Amer >60 >60 mL/min    Comment: (NOTE) The eGFR has been calculated using the CKD EPI equation. This calculation has not been validated in all clinical situations. eGFR's persistently <60 mL/min signify possible Chronic Kidney Disease.    Anion gap 11 5 - 15    Comment: Performed at [facility], [place] [address], [place], [place] [address]  Protime-INR     Status: None   Collection Time: 05/28/17  6:20 AM  Result Value Ref Range   Prothrombin Time 13.6 11.4 - 15.2 seconds   INR 1.05     Comment: Performed at  [facility], [place] [address], [place], [place] [address]  CBC     Status: Abnormal   Collection Time: 05/29/17  5:47 AM  Result Value Ref Range   WBC 12.2 (H) 4.0 - 10.5 K/uL   RBC 2.98 (L) 3.87 - 5.11 MIL/uL   Hemoglobin 9.8 (L) 12.0 - 15.0 g/dL   HCT 28.4 (L) 36.0 - 46.0 %   MCV 95.3 78.0 - 100.0 fL   MCH 32.9 26.0 - 34.0 pg   MCHC 34.5 30.0 - 36.0 g/dL   RDW 13.1 11.5 - 15.5 %   Platelets 217 150 - 400 K/uL    Comment: Performed at [facility], [place] [address], [place], [place] [address]  Basic metabolic panel     Status: Abnormal   Collection Time: 05/29/17  5:47 AM  Result Value Ref Range   Sodium 140 135 - 145 mmol/L   Potassium 3.8 3.5 - 5.1 mmol/L   Chloride 106 101 - 111 mmol/L   CO2 26 22 - 32 mmol/L   Glucose, Bld 116 (H) 65 - 99 mg/dL   BUN 13 6 - 20 mg/dL   Creatinine, Ser 0.61 0.44 - 1.00 mg/dL   Calcium 8.4 (L) 8.9 - 10.3 mg/dL   GFR calc non Af Amer >60 >60 mL/min   GFR calc Af Amer >60 >60 mL/min    Comment: (NOTE) The eGFR has been calculated using the CKD EPI equation. This calculation has not been validated in all clinical situations. eGFR's persistently <60 mL/min signify possible Chronic Kidney Disease.    Anion gap 8 5 - 15    Comment: Performed at [facility], [place] [address] [place], [place]  [address]  Protime-INR     Status: None   Collection Time: 05/29/17  5:47 AM  Result Value Ref Range   Prothrombin Time 13.9 11.4 - 15.2 seconds   INR 1.08     Comment: Performed at [facility], [place] [address], [place], [place] [address]   No results found.     Medical Problem List and Plan: 1.  Decreased functional mobility secondary to end-stage osteoarthritis bilateral knees. Status post bilateral TKA 05/27/2017. Weightbearing as tolerated.   -admit to inpatient rehab 2.  DVT Prophylaxis/Anticoagulation: Coumadin for DVT prophylaxis. Plan is for Coumadin 4 weeks then aspirin 81 mg daily Check vascular study 3. Pain Management: Neurontin 300 mg 3 times a day, Valium and oxycodone as needed. Monitor narcotics for increased sedation  ` -scheduled ice to knees while awake 4. Mood: Provide emotional support 5. Neuropsych: This patient is capable of making decisions on her own behalf. 6. Skin/Wound Care: Routine skin checks 7. Fluids/Electrolytes/Nutrition: Routine I&O's with follow-up chemistries 8. Acute blood loss anemia. Follow-up CBC 9. Chronic constipation. Continue Movantik, qod miralax, dulcolax suppository tonight   -pt has hx of IBS 10.GERD.Prilosec     Post Admission Physician Evaluation: 1. Functional deficits secondary  to OA bilateral knees, s/p TKA's. 2. Patient is admitted to receive collaborative, interdisciplinary care between the physiatrist, rehab nursing staff, and therapy team. 3. Patient's level of medical complexity and substantial therapy needs in context of that medical necessity cannot be provided at a lesser intensity of care such as a SNF. 4. Patient has experienced substantial functional loss from his/her baseline which was documented above under the "Functional History" and "Functional Status" headings.  Judging by the patient's diagnosis, physical exam, and functional history, the patient has potential for functional progress which  will  result in measurable gains while on inpatient rehab.  These gains will be of substantial and practical use upon discharge  in facilitating mobility and self-care at the household level. 5. Physiatrist will provide 24 hour management of medical needs as well as oversight of the therapy plan/treatment and provide guidance as appropriate regarding the interaction of the two. 6. The Preadmission Screening has been reviewed and patient status is unchanged unless otherwise stated above. 7. 24 hour rehab nursing will assist with bladder management, bowel management, safety, skin/wound care, disease management, medication administration, pain management and patient education  and help integrate therapy concepts, techniques,education, etc. 8. PT will assess and treat for/with: Lower extremity strength, range of motion, stamina, balance, functional mobility, safety, adaptive techniques and equipment, knee ROM, pain control, family ed.   Goals are: mod I. 9. OT will assess and treat for/with: ADL's, functional mobility, safety, upper extremity strength, adaptive techniques and equipment, pain mgt, family education.   Goals are: mod I. Therapy may proceed with showering this patient. 10. SLP will assess and treat for/with: n/a.  Goals are: n/a. 11. Case Management and Social Worker will assess and treat for psychological issues and discharge planning. 12. Team conference will be held weekly to assess progress toward goals and to determine barriers to discharge. 13. Patient will receive at least 3 hours of therapy per day at least 5 days per week. 14. ELOS: 7-10 days       15. Prognosis:  excellent     Meredith Staggers, MD, [facility] 06/02/2017  Lavon Paganini [place], PA-C 05/29/2017

## 2017-05-29 NOTE — Progress Notes (Signed)
Physical Therapy Treatment Patient Details Name: Sherry [name] MRN: [id number] DOB: 1946/01/15 Today's Date: 05/29/2017    History of Present Illness S/P bil TKA    PT Comments    POD # 2 am session Applied B KI and instructed on proper application and use.  Assisted to EOB with increased time and allowing pt to use her leg lifter to assist.  Mild c/o dizziness EOB, allowed increased time between position changes.  + 2 assist to rise to feet pt was able to amb a little before c/o increased dizziness and fatigue.  Positioned in recler and removed KI and applied ICE to B knees.  Foley cath and epidural still in.   Follow Up Recommendations  CIR     Equipment Recommendations  Rolling walker with 5" wheels    Recommendations for Other Services Rehab consult     Precautions / Restrictions Precautions Precautions: Knee;Fall Precaution Comments: epidural, monitor BP Required Braces or Orthoses: Knee Immobilizer - Right;Knee Immobilizer - Left Knee Immobilizer - Right: Discontinue once straight leg raise with < 10 degree lag Knee Immobilizer - Left: Discontinue once straight leg raise with < 10 degree lag Restrictions Weight Bearing Restrictions: No Other Position/Activity Restrictions: WBAT    Mobility  Bed Mobility Overal bed mobility: Needs Assistance Bed Mobility: Supine to Sit     Supine to sit: Min assist     General bed mobility comments: patient used leg lifter to self assist the legs, cus to use UE's once  feet over bed edge.  Transfers Overall transfer level: Needs assistance Equipment used: Rolling walker (2 wheeled) Transfers: Sit to/from Stand Sit to Stand: Mod assist;+2 physical assistance;+2 safety/equipment         General transfer comment: from very high bed; assist to rise and stabilize. Cues for walking feet forward and backwards and cues for UE placement  Ambulation/Gait Ambulation/Gait assistance: Mod assist;+2 physical assistance;+2  safety/equipment Ambulation Distance (Feet): 2 Feet Assistive device: Rolling walker (2 wheeled) Gait Pattern/deviations: Step-to pattern Gait velocity: decreased   General Gait Details: amb a few steps from bed forward as recliner followed   Stairs            Wheelchair Mobility    Modified Rankin (Stroke Patients Only)       Balance                                            Cognition Arousal/Alertness: Awake/alert Behavior During Therapy: WFL for tasks assessed/performed Overall Cognitive Status: Within Functional Limits for tasks assessed                                        Exercises      General Comments        Pertinent Vitals/Pain Pain Score: 9  Pain Location: R knee > L knee Pain Descriptors / Indicators: Aching;Discomfort;Operative site guarding Pain Intervention(s): Monitored during session;Repositioned;Ice applied;Premedicated before session    Home Living                      Prior Function            PT Goals (current goals can now be found in the care plan section) Progress towards PT goals: Progressing toward goals    Frequency  7X/week      PT Plan Current plan remains appropriate    Co-evaluation              AM-PAC PT "6 Clicks" Daily Activity  Outcome Measure  Difficulty turning over in bed (including adjusting bedclothes, sheets and blankets)?: Unable Difficulty moving from lying on back to sitting on the side of the bed? : Unable Difficulty sitting down on and standing up from a chair with arms (e.g., wheelchair, bedside commode, etc,.)?: Unable Help needed moving to and from a bed to chair (including a wheelchair)?: Total Help needed walking in hospital room?: Total Help needed climbing 3-5 steps with a railing? : Total 6 Click Score: 6    End of Session Equipment Utilized During Treatment: Gait belt;Right knee immobilizer;Left knee immobilizer Activity  Tolerance: Patient tolerated treatment well Patient left: in chair;with call bell/phone within reach Nurse Communication: Mobility status PT Visit Diagnosis: Unsteadiness on feet (R26.81);Pain Pain - Right/Left: Right Pain - part of body: Knee     Time: 9528-4132 PT Time Calculation (min) (ACUTE ONLY): 29 min  Charges:  $Gait Training: 8-22 mins $Therapeutic Activity: 8-22 mins                    G Codes:       Rica Koyanagi  PTA [facility]  Acute  Rehab Pager      [phone]

## 2017-05-29 NOTE — Addendum Note (Signed)
Addendum  created 05/29/17 1345 by Janeece Riggers, MD   Sign clinical note

## 2017-05-29 NOTE — Progress Notes (Signed)
Inpatient Rehabilitation  Met with patient and spouse at bedside to discuss team's recommendation for IP Rehab.  Shared booklets, insurance verification letter, and answered initial questions.  Have initiated insurance authorization.  Plan to follow for timing of medical readiness, insurance authorization, and IP Rehab bed availability.  Call if questions.    Carmelia Roller., CCC/SLP Admission Coordinator  [place]  Cell [phone]

## 2017-05-29 NOTE — Progress Notes (Signed)
Inpatient Rehabilitation  I have received a denial for and IP Rehab admission.  Per [facility] Medicare the patient doesn't require close/daily medical monitoring from a physician.  Dr. Maureen Ralphs has the option for a peer to peer review until Monday at [facility].  Per [facility] Medicare case manger I would need to notify her and she will arrange this with their medical director.  I dicussed this with Dian Situ.  Plan to follow up with the team for their decision Monday.  Call if questions.   Carmelia Roller., CCC/SLP Admission Coordinator  [place]  Cell [phone]

## 2017-05-29 NOTE — Progress Notes (Signed)
Noted mild leaking/bleeding from epidural catheter site. Covered w/ gauze, held pressure. No active drainage noted. Covered w/ gauze/tape. Will monitor closely for drainage. Educated patient to notify nurse of headache.

## 2017-05-30 LAB — CBC
HCT: 29.6 % — ABNORMAL LOW (ref 36.0–46.0)
Hemoglobin: 9.6 g/dL — ABNORMAL LOW (ref 12.0–15.0)
MCH: 31.7 pg (ref 26.0–34.0)
MCHC: 32.4 g/dL (ref 30.0–36.0)
MCV: 97.7 fL (ref 78.0–100.0)
Platelets: 266 10*3/uL (ref 150–400)
RBC: 3.03 MIL/uL — ABNORMAL LOW (ref 3.87–5.11)
RDW: 13.5 % (ref 11.5–15.5)
WBC: 11.1 10*3/uL — ABNORMAL HIGH (ref 4.0–10.5)

## 2017-05-30 LAB — BASIC METABOLIC PANEL
Anion gap: 8 (ref 5–15)
BUN: 11 mg/dL (ref 6–20)
CO2: 29 mmol/L (ref 22–32)
Calcium: 8.6 mg/dL — ABNORMAL LOW (ref 8.9–10.3)
Chloride: 104 mmol/L (ref 101–111)
Creatinine, Ser: 0.69 mg/dL (ref 0.44–1.00)
GFR calc Af Amer: >60 mL/min (ref >60)
GFR calc non Af Amer: >60 mL/min (ref >60)
Glucose, Bld: 107 mg/dL — ABNORMAL HIGH (ref 65–99)
Potassium: 4 mmol/L (ref 3.5–5.1)
Sodium: 141 mmol/L (ref 135–145)

## 2017-05-30 LAB — PROTIME-INR
INR: 1.09
Prothrombin Time: 14 seconds (ref 11.4–15.2)

## 2017-05-30 MED ORDER — MORPHINE SULFATE (PF) 2 MG/ML IV SOLN: 1.0000 mg | INTRAVENOUS | Status: DC | PRN

## 2017-05-30 MED ORDER — WARFARIN SODIUM 4 MG PO TABS
4.0000 mg | ORAL_TABLET | Freq: Once | ORAL | Status: AC
  Administered 2017-05-30: 4 mg via ORAL
  Filled 2017-05-30: qty 1

## 2017-05-30 NOTE — Progress Notes (Addendum)
[place] for warfarin Indication: VTE prophylaxis status post TKA   Allergies  Allergen Reactions  . Adhesive [Tape] Other (See Comments)    Blisters; "gets puffy and red" Liquid Bandaids    Patient Measurements: Height: 5\' 5"  (165.1 cm) Weight: 148 lb (67.1 kg) IBW/kg (Calculated) : 57  Vital Signs: Temp: 97.8 F (36.6 C) (02/16 0517) Temp Source: Oral (02/16 0517) BP: 129/68 (02/16 0517) Pulse Rate: 118 (02/16 0517)  Labs: Recent Labs    05/28/17 0620 05/29/17 0547 05/30/17 0512  HGB 10.7* 9.8* 9.6*  HCT 31.6* 28.4* 29.6*  PLT 243 217 266  LABPROT 13.6 13.9  --   INR 1.05 1.08  --   CREATININE 0.57 0.61 0.69    Estimated Creatinine Clearance: 58.9 mL/min (by C-G formula based on SCr of 0.69 mg/dL).   Medical History: Past Medical History:  Diagnosis Date  . Anxiety   . Carpal tunnel syndrome on both sides   . Chronic back pain    herniated disc,stenosis,radiculopathy,spondylolisthesis  . Chronic neck pain   . Constipation, chronic   . Degenerative spinal arthritis    cervical and lumbar  . Depression   . Eczema   . GERD (gastroesophageal reflux disease)   . Hemorrhoids   . Hiatal hernia   . History of bronchitis    "I've had it a number of times"  . IBS (irritable bowel syndrome)    lactose intolerant  . Osteoarthritis    bilateral knees and shoulders,  little fingers  . TMJ (dislocation of temporomandibular joint)   . Wears glasses     Assessment: Sherry [name] with history of BL osteoarthritis of the knees. Presented to [facility] on 2/13 for BL TKA. Patient not on anticoagulation prior to admission. Coumadin ordered post op for VTE prophylaxis. Ortho team plans to continue with Coumadin for 4 weeks, then transition to aspirin 81mg  daily.  Today, 05/30/2017: - INR = 1.09  - hgb down slightly to 9.6, plts WNL - no bleeding documented - no significant drug-drug intxns - regular diet - Pt's RN Anderson [place])  called 2/15 at 1:37PM to report that epidural has been removed - started enoxaparin 30mg  SQ q12h 2/15 PM  Goal of Therapy:  INR 2-3  Plan:  - warfarin 4 mg PO x1 today - daily INR - stop enoxaparin when INR therapeutic - Monitor for signs and symptoms of bleeding  Doreene Eland, PharmD, BCPS.   Pager: [phone] 05/30/2017 9:07 AM

## 2017-05-30 NOTE — Progress Notes (Signed)
Physical Therapy Treatment Patient Details Name: Sherry [name] MRN: [id number] DOB: 1946-04-07 Today's Date: 05/30/2017    History of Present Illness S/P bil TKA    PT Comments    Pt with better carryover this afternoon after patient's medication began to wear off.  Pt required decreased assistance with transfers but remains to require+2 assistance externally to achieve standing.  Pt ambulated 40 ft with Moderate assistance +2.  She is improving well now that pain medication isn't inhibiting her performance.  Pt remains an excellent candidate for CIR therapies to improve strength and function before returning home to private residence.  Pt shaking post gait and reports she has been doing this when she begins to hurt.  Informed nursing.   Follow Up Recommendations  CIR     Equipment Recommendations  Rolling walker with 5" wheels    Recommendations for Other Services Rehab consult     Precautions / Restrictions Precautions Precautions: Knee;Fall Precaution Comments: monitor BP Required Braces or Orthoses: Knee Immobilizer - Right;Knee Immobilizer - Left Knee Immobilizer - Right: Discontinue once straight leg raise with < 10 degree lag Knee Immobilizer - Left: Discontinue once straight leg raise with < 10 degree lag Restrictions Weight Bearing Restrictions: Yes RLE Weight Bearing: Weight bearing as tolerated LLE Weight Bearing: Weight bearing as tolerated Other Position/Activity Restrictions: WBAT    Mobility  Bed Mobility Overal bed mobility: Needs Assistance Bed Mobility: Sit to Supine     Supine to sit: Mod assist Sit to supine: Min assist   General bed mobility comments: Pt performed supine to sit with decreased assistance.  Pt required management of B LEs but able to return to supine and manage her upper trunk including pulling up to [facility] with use of rail.    Transfers Overall transfer level: Needs assistance Equipment used: Rolling walker (2 wheeled) Transfers:  Sit to/from Stand Sit to Stand: +2 physical assistance;Mod assist         General transfer comment: Cues for hand and foot placement to and from seated surface. Pt more alert and able to assist more with transfers, including her posture.  She remains slightly flexed at her hips in standing.    Ambulation/Gait Ambulation/Gait assistance: Mod assist;+2 physical assistance;+2 safety/equipment(as gait progress she required mod +1 for physical assistance and +2 remains for close chair follow.  ) Ambulation Distance (Feet): 40 Feet Assistive device: Rolling walker (2 wheeled) Gait Pattern/deviations: Step-to pattern;Decreased step length - right;Decreased step length - left;Decreased stride length;Antalgic;Trunk flexed Gait velocity: decreased Gait velocity interpretation: Below normal speed for age/gender General Gait Details: Cues for LE advancement, upper trunk control and RW positioning and sequencing.  Close chair follow for safety.     Stairs            Wheelchair Mobility    Modified Rankin (Stroke Patients Only)       Balance Overall balance assessment: Needs assistance   Sitting balance-Leahy Scale: Poor       Standing balance-Leahy Scale: Poor                              Cognition Arousal/Alertness: Awake/alert Behavior During Therapy: WFL for tasks assessed/performed Overall Cognitive Status: Within Functional Limits for tasks assessed  Exercises Total Joint Exercises Ankle Circles/Pumps: AROM;Both;20 reps;Supine Quad Sets: AROM;Both;5 reps;Supine Heel Slides: AAROM;Both;10 reps;Supine Goniometric ROM: L leg was 72 degrees / R leg was 64 degrees    General Comments        Pertinent Vitals/Pain Pain Assessment: 0-10 Pain Score: 5  Pain Location: R knee > L knee Pain Descriptors / Indicators: Aching;Discomfort;Operative site guarding(pt starting to shake with discomfort after  returning to bed. ) Pain Intervention(s): Monitored during session;Repositioned    Home Living                      Prior Function            PT Goals (current goals can now be found in the care plan section) Acute Rehab PT Goals Patient Stated Goal: return to caring for grandchildren Potential to Achieve Goals: Good Progress towards PT goals: Progressing toward goals    Frequency    7X/week      PT Plan Current plan remains appropriate    Co-evaluation              AM-PAC PT "6 Clicks" Daily Activity  Outcome Measure  Difficulty turning over in bed (including adjusting bedclothes, sheets and blankets)?: Unable Difficulty moving from lying on back to sitting on the side of the bed? : Unable Difficulty sitting down on and standing up from a chair with arms (e.g., wheelchair, bedside commode, etc,.)?: Unable Help needed moving to and from a bed to chair (including a wheelchair)?: A Lot Help needed walking in hospital room?: A Lot Help needed climbing 3-5 steps with a railing? : Total 6 Click Score: 8    End of Session Equipment Utilized During Treatment: Gait belt Activity Tolerance: Patient tolerated treatment well Patient left: in chair;with call bell/phone within reach Nurse Communication: Mobility status PT Visit Diagnosis: Unsteadiness on feet (R26.81);Pain Pain - Right/Left: Right Pain - part of body: Knee     Time: 3086-5784 PT Time Calculation (min) (ACUTE ONLY): 23 min  Charges:  $Gait Training: 8-22 mins  $Therapeutic Activity: 8-22 mins                    G Codes:       Governor Rooks, PTA pager [phone]    Cristela Blue 05/30/2017, 3:21 PM

## 2017-05-30 NOTE — Progress Notes (Signed)
    Subjective:  Patient reports pain as mild to moderate.  Denies N/V/CP/SOB. C/o R > L knee pain. States pain medication is making her groggy.  Objective:   VITALS:   Vitals:   05/29/17 1008 05/29/17 1417 05/29/17 2037 05/30/17 0517  BP: (!) 160/71 138/67 (!) 155/72 129/68  Pulse: 95 80 100 (!) 118  Resp: 14 14 15 17   Temp: 97.8 F (36.6 C) 97.6 F (36.4 C) 97.8 F (36.6 C) 97.8 F (36.6 C)  TempSrc: Oral Oral Oral Oral  SpO2: 98% 98% 100% 93%  Weight:      Height:        NAD ABD soft Sensation intact distally Intact pulses distally Dorsiflexion/Plantar flexion intact Compartment soft RLE drsg c/d/i LLE drsg moderate dried ss drainage, drsg changed  Lab Results  Component Value Date   WBC 11.1 (H) 05/30/2017   HGB 9.6 (L) 05/30/2017   HCT 29.6 (L) 05/30/2017   MCV 97.7 05/30/2017   PLT 266 05/30/2017   BMET    Component Value Date/Time   NA 141 05/30/2017 0512   K 4.0 05/30/2017 0512   CL 104 05/30/2017 0512   CO2 29 05/30/2017 0512   GLUCOSE 107 (H) 05/30/2017 0512   BUN 11 05/30/2017 0512   CREATININE 0.69 05/30/2017 0512   CALCIUM 8.6 (L) 05/30/2017 0512   GFRNONAA >60 05/30/2017 0512   GFRAA >60 05/30/2017 0512    INR pending  Assessment/Plan: 3 Days Post-Op   Principal Problem:   OA (osteoarthritis) of knee   WBAT with walker DVT ppx: coumadin with lovenox bridge, SCDs, TEDS PO pain control: will change freq of IV narcotics to prevent overmedication, encourage PO control PT/OT Dispo: CIR Monday if approved   Hilton Cork Jaidin Richison 05/30/2017, 8:50 AM   Rod Can, MD Cell [phone]

## 2017-05-30 NOTE — Progress Notes (Addendum)
Physical Therapy Treatment Patient Details Name: Sherry [name] MRN: [id number] DOB: 03/02/1946 Today's Date: 05/30/2017    History of Present Illness S/P bil TKA    PT Comments    Pt remains lethargic due to pain medications previously administered.  Pt not in pain during session and due to her presentation it was difficult to progress functional mobility.  Pt required +2 max assistance and unable to step and turn to pivot so she required chair to be brought to her for transfer OOB.  Pt remains to require rehab in a post acute setting as she is not safe to return home in her current functional state.    Follow Up Recommendations  CIR     Equipment Recommendations  Rolling walker with 5" wheels    Recommendations for Other Services Rehab consult     Precautions / Restrictions Precautions Precautions: Knee;Fall Precaution Comments: monitor BP Required Braces or Orthoses: Knee Immobilizer - Right;Knee Immobilizer - Left Knee Immobilizer - Right: Discontinue once straight leg raise with < 10 degree lag Knee Immobilizer - Left: Discontinue once straight leg raise with < 10 degree lag Restrictions Weight Bearing Restrictions: Yes RLE Weight Bearing: Weight bearing as tolerated LLE Weight Bearing: Weight bearing as tolerated    Mobility  Bed Mobility Overal bed mobility: Needs Assistance Bed Mobility: Sit to Supine     Supine to sit: Mod assist     General bed mobility comments: Pt required  assistance to advance LEs to edge of bed, she was able to elevate trunk into sitting and follow commands for hand placement on railings to achieve sitting.    Transfers Overall transfer level: Needs assistance Equipment used: Rolling walker (2 wheeled) Transfers: Sit to/from Stand Sit to Stand: Max assist;+2 physical assistance         General transfer comment: Pt's bed placed in elevated position pre transfer and she was unable to stand without Heavy max assistance.  In  standing pt presented with flexed LEs, forward flexed trunk and hip flexion.  Constant cueing and faciliation to achieve upright standing.  Pt unable to progress steps in this posture and bed moved and chair placed behind patient for transfer to chair.    Ambulation/Gait                 Stairs            Wheelchair Mobility    Modified Rankin (Stroke Patients Only)       Balance Overall balance assessment: Needs assistance   Sitting balance-Leahy Scale: Poor       Standing balance-Leahy Scale: Poor                              Cognition Arousal/Alertness: lethargic suspect due to medications Behavior During Therapy: flat Overall Cognitive Status: slow response to commands and intermittently closing her eyes.                                        Exercises Total Joint Exercises Ankle Circles/Pumps: AROM;Both;20 reps Quad Sets: AROM;Both;10 reps Heel Slides: AAROM;Both;10 reps Goniometric ROM: L leg was 72 degrees / R leg was 64 degrees    General Comments        Pertinent Vitals/Pain Pain Assessment: No/denies pain(Pt with high amounts of pain medicine and has difficulty keeping her eyes open.  )  Home Living                      Prior Function            PT Goals (current goals can now be found in the care plan section) Acute Rehab PT Goals Patient Stated Goal: return to caring for grandchildren Potential to Achieve Goals: Good Progress towards PT goals: Progressing toward goals    Frequency    7X/week      PT Plan Current plan remains appropriate    Co-evaluation              AM-PAC PT "6 Clicks" Daily Activity  Outcome Measure  Difficulty turning over in bed (including adjusting bedclothes, sheets and blankets)?: Unable Difficulty moving from lying on back to sitting on the side of the bed? : Unable Difficulty sitting down on and standing up from a chair with arms (e.g., wheelchair,  bedside commode, etc,.)?: Unable Help needed moving to and from a bed to chair (including a wheelchair)?: Total Help needed walking in hospital room?: Total Help needed climbing 3-5 steps with a railing? : Total 6 Click Score: 6    End of Session Equipment Utilized During Treatment: Gait belt Activity Tolerance: Patient tolerated treatment well Patient left: in chair;with call bell/phone within reach Nurse Communication: Mobility status PT Visit Diagnosis: Unsteadiness on feet (R26.81);Pain Pain - Right/Left: Right Pain - part of body: Knee     Time: 9485-4627 PT Time Calculation (min) (ACUTE ONLY): 26 min  Charges:  $Therapeutic Exercise: 8-22 mins $Therapeutic Activity: 8-22 mins                    G Codes:       Governor Rooks, PTA pager [phone]    Cristela Blue 05/30/2017, 12:59 PM

## 2017-05-31 LAB — CBC
HCT: 26 % — ABNORMAL LOW (ref 36.0–46.0)
Hemoglobin: 8.8 g/dL — ABNORMAL LOW (ref 12.0–15.0)
MCH: 32.1 pg (ref 26.0–34.0)
MCHC: 33.8 g/dL (ref 30.0–36.0)
MCV: 94.9 fL (ref 78.0–100.0)
Platelets: 277 10*3/uL (ref 150–400)
RBC: 2.74 MIL/uL — ABNORMAL LOW (ref 3.87–5.11)
RDW: 13.3 % (ref 11.5–15.5)
WBC: 8.8 10*3/uL (ref 4.0–10.5)

## 2017-05-31 LAB — PROTIME-INR
INR: 1.38
Prothrombin Time: 16.9 seconds — ABNORMAL HIGH (ref 11.4–15.2)

## 2017-05-31 MED ORDER — WARFARIN SODIUM 4 MG PO TABS
4.0000 mg | ORAL_TABLET | Freq: Once | ORAL | Status: AC
  Administered 2017-05-31: 4 mg via ORAL
  Filled 2017-05-31: qty 1

## 2017-05-31 NOTE — Progress Notes (Signed)
Subjective: 4 Days Post-Op Procedure(s) (LRB): TOTAL KNEE BILATERAL (Bilateral) Patient reports pain as 3 on 0-10 scale.   Feels week . No dizziness Objective: Vital signs in last 24 hours: Temp:  [97.4 F (36.3 C)-97.8 F (36.6 C)] 97.8 F (36.6 C) (02/17 0517) Pulse Rate:  [88-116] 116 (02/17 0517) Resp:  [14-17] 17 (02/17 0517) BP: (134-159)/(63-74) 141/74 (02/17 0517) SpO2:  [93 %-97 %] 97 % (02/17 0517)  Intake/Output from previous day: 02/16 0701 - 02/17 0700 In: 800 [P.O.:800] Out: 4050 [Urine:4050] Intake/Output this shift: No intake/output data recorded.  Recent Labs    05/29/17 0547 05/30/17 0512 05/31/17 0517  HGB 9.8* 9.6* 8.8*   Recent Labs    05/30/17 0512 05/31/17 0517  WBC 11.1* 8.8  RBC 3.03* 2.74*  HCT 29.6* 26.0*  PLT 266 277   Recent Labs    05/29/17 0547 05/30/17 0512  NA 140 141  K 3.8 4.0  CL 106 104  CO2 26 29  BUN 13 11  CREATININE 0.61 0.69  GLUCOSE 116* 107*  CALCIUM 8.4* 8.6*   Recent Labs    05/30/17 0512 05/31/17 0517  INR 1.09 1.38    Neurologically intact Neurovascular intact Intact pulses distally Incision: dressing C/D/I No DVT Assessment/Plan: 4 Days Post-Op Procedure(s) (LRB): TOTAL KNEE BILATERAL (Bilateral) Advance diet Up with therapy  Repeat CBC. PT if Sym anemia transfusion . Diiscussed with patient.  Sherry [name] C 05/31/2017, 8:15 AM

## 2017-05-31 NOTE — Progress Notes (Addendum)
Physical Therapy Treatment Patient Details Name: Sherry [name] MRN: [id number] DOB: 04-Oct-1945 Today's Date: 05/31/2017    History of Present Illness S/P bil TKA    PT Comments    The patient is very drowsy and only able to transfer to recliner this visit. Constant stimulation required to stay aroused to participate in exercises.   HR 120, BP 141/70  Follow Up Recommendations  CIR     Equipment Recommendations  Rolling walker with 5" wheels    Recommendations for Other Services Rehab consult     Precautions / Restrictions Precautions Precautions: Knee;Fall Precaution Comments: monitor BP Knee Immobilizer - Right: Discontinue once straight leg raise with < 10 degree lag Knee Immobilizer - Left: Discontinue once straight leg raise with < 10 degree lag Restrictions RLE Weight Bearing: Weight bearing as tolerated LLE Weight Bearing: Weight bearing as tolerated    Mobility  Bed Mobility Overal bed mobility: Needs Assistance Bed Mobility: Supine to Sit     Supine to sit: Mod assist     General bed mobility comments: Pt performed supine to sit with decreased assistance.  Pt required management of B LEs . the patient is very sluggish today.  Transfers Overall transfer level: Needs assistance Equipment used: Rolling walker (2 wheeled) Transfers: Sit to/from [facility] Sit to Stand: +2 physical assistance;Mod assist;From elevated surface         General transfer comment: Cues for hand and foot placement to and from seated surface. Assist to rise,  She remains slightly flexed at her hips in standing.  Patient too drowsy to ambulate. Assisted to recliner with much mod max assist.   Ambulation/Gait                 Stairs            Wheelchair Mobility    Modified Rankin (Stroke Patients Only)       Balance                                            Cognition Arousal/Alertness: Lethargic;Suspect due to  medications                                            Exercises Total Joint Exercises Long Arc Quad: AAROM;Both;10 reps Knee Flexion: AAROM;Both;10 reps Goniometric ROM: L knee 70, right knee 60    General Comments        Pertinent Vitals/Pain Pain Score: 10-Worst pain ever Pain Location: L knee > R knee Pain Descriptors / Indicators: Aching;Discomfort;Operative site guarding Pain Intervention(s): Limited activity within patient's tolerance;Monitored during session;Premedicated before session;Ice applied    Home Living                      Prior Function            PT Goals (current goals can now be found in the care plan section) Progress towards PT goals: Progressing toward goals    Frequency    7X/week      PT Plan Current plan remains appropriate    Co-evaluation              AM-PAC PT "6 Clicks" Daily Activity  Outcome Measure  Difficulty turning over in bed (including adjusting  bedclothes, sheets and blankets)?: Unable Difficulty moving from lying on back to sitting on the side of the bed? : Unable Difficulty sitting down on and standing up from a chair with arms (e.g., wheelchair, bedside commode, etc,.)?: Unable Help needed moving to and from a bed to chair (including a wheelchair)?: Total Help needed walking in hospital room?: Total Help needed climbing 3-5 steps with a railing? : Total 6 Click Score: 6    End of Session Equipment Utilized During Treatment: Gait belt Activity Tolerance: Patient limited by lethargy Patient left: in chair;with call bell/phone within reach;with family/visitor present Nurse Communication: Mobility status PT Visit Diagnosis: Unsteadiness on feet (R26.81);Pain Pain - Right/Left: Left Pain - part of body: Knee     Time: 1100-1139 PT Time Calculation (min) (ACUTE ONLY): 39 min  Charges:  $Therapeutic Exercise: 8-22 mins $Therapeutic Activity: 23-37 mins                    G Codes:          Claretha Cooper 05/31/2017, 4:32 PM

## 2017-05-31 NOTE — Progress Notes (Signed)
[facility] for warfarin Indication: VTE prophylaxis status post TKA   Allergies  Allergen Reactions  . Adhesive [Tape] Other (See Comments)    Blisters; "gets puffy and red" Liquid Bandaids    Patient Measurements: Height: 5\' 5"  (165.1 cm) Weight: 148 lb (67.1 kg) IBW/kg (Calculated) : 57  Vital Signs: Temp: 97.8 F (36.6 C) (02/17 0517) Temp Source: Oral (02/17 0517) BP: 141/74 (02/17 0517) Pulse Rate: 116 (02/17 0517)  Labs: Recent Labs    05/29/17 0547 05/30/17 0512 05/31/17 0517  HGB 9.8* 9.6* 8.8*  HCT 28.4* 29.6* 26.0*  PLT 217 266 277  LABPROT 13.9 14.0 16.9*  INR 1.08 1.09 1.38  CREATININE 0.61 0.69  --     Estimated Creatinine Clearance: 58.9 mL/min (by C-G formula based on SCr of 0.69 mg/dL).   Medical History: Past Medical History:  Diagnosis Date  . Anxiety   . Carpal tunnel syndrome on both sides   . Chronic back pain    herniated disc,stenosis,radiculopathy,spondylolisthesis  . Chronic neck pain   . Constipation, chronic   . Degenerative spinal arthritis    cervical and lumbar  . Depression   . Eczema   . GERD (gastroesophageal reflux disease)   . Hemorrhoids   . Hiatal hernia   . History of bronchitis    "I've had it a number of times"  . IBS (irritable bowel syndrome)    lactose intolerant  . Osteoarthritis    bilateral knees and shoulders,  little fingers  . TMJ (dislocation of temporomandibular joint)   . Wears glasses     Assessment: 11 YOF with history of BL osteoarthritis of the knees. Presented to [facility] on 2/13 for BL TKA. Patient not on anticoagulation prior to admission. Coumadin ordered post op for VTE prophylaxis. Ortho team plans to continue with Coumadin for 4 weeks, then transition to aspirin 81mg  daily.  Today, 05/31/2017: - INR = 1.38 - trending up - hgb dec to 8.8, plts WNL - no bleeding documented - no significant drug-drug intxns - regular diet - Pt's RN Anderson Malta)  called 2/15 at 1:37PM to report that epidural has been removed - started enoxaparin 30mg  SQ q12h 2/15 PM  Goal of Therapy:  INR 2-3  Plan:  - warfarin 4 mg PO x1 today as INR trending up nicely overnight - daily INR - stop enoxaparin when INR therapeutic - Monitor for signs and symptoms of bleeding  Doreene Eland, PharmD, BCPS.   Pager: [phone] 05/31/2017 8:56 AM

## 2017-05-31 NOTE — Progress Notes (Signed)
Physical Therapy Treatment Patient Details Name: Sherry [name] MRN: [id number] DOB: 1945-08-14 Today's Date: 05/31/2017    History of Present Illness S/P bil TKA    PT Comments    The patient is more alert this visit. HR 127 after ambulation. Recommend post acute CIR. Patient Has 2 level home, spouse unable to assist patient physically. Continue PT.   Follow Up Recommendations  CIR     Equipment Recommendations  Rolling walker with 5" wheels    Recommendations for Other Services Rehab consult     Precautions / Restrictions Precautions Precautions: Knee;Fall Precaution Comments: monitor BP, HR, did not use KI on either leg Knee Immobilizer - Right: Discontinue once straight leg raise with < 10 degree lag Knee Immobilizer - Left: Discontinue once straight leg raise with < 10 degree lag Restrictions RLE Weight Bearing: Weight bearing as tolerated LLE Weight Bearing: Weight bearing as tolerated    Mobility  Bed Mobility Overal bed mobility: Needs Assistance Bed Mobility: Sit to Supine     Supine to sit: Mod assist Sit to supine: Mod assist   General bed mobility comments: asisst with legs onto bed. use of leg lifter to self assist right leg  Transfers Overall transfer level: Needs assistance Equipment used: Rolling walker (2 wheeled) Transfers: Sit to/from Stand Sit to Stand: +2 physical assistance;Mod assist;From elevated surface         General transfer comment: Cues for hand and foot placement , patient able to work feet back in preparation to stand. 2 assist to stand from recliner and steady assist to transition hands to RW.  Ambulation/Gait Ambulation/Gait assistance: Mod assist;+2 physical assistance;+2 safety/equipment Ambulation Distance (Feet): 30 Feet Assistive device: Rolling walker (2 wheeled) Gait Pattern/deviations: Step-to pattern;Decreased step length - right;Decreased step length - left;Decreased stride length;Antalgic;Trunk flexed Gait  velocity: decreased   General Gait Details: Cues for LE advancement, upper trunk control and RW positioning and sequencing.  Close chair follow for safety.     Stairs            Wheelchair Mobility    Modified Rankin (Stroke Patients Only)       Balance                                            Cognition Arousal/Alertness: Awake/alert                                            Exercises    General Comments        Pertinent Vitals/Pain Pain Score: 10-Worst pain ever Pain Location: L knee > R knee Pain Descriptors / Indicators: Aching;Discomfort;Operative site guarding Pain Intervention(s): Monitored during session;Premedicated before session;Ice applied;Limited activity within patient's tolerance    Home Living                      Prior Function            PT Goals (current goals can now be found in the care plan section) Progress towards PT goals: Progressing toward goals    Frequency    7X/week      PT Plan Current plan remains appropriate    Co-evaluation              AM-PAC  PT "6 Clicks" Daily Activity  Outcome Measure  Difficulty turning over in bed (including adjusting bedclothes, sheets and blankets)?: Unable Difficulty moving from lying on back to sitting on the side of the bed? : Unable Difficulty sitting down on and standing up from a chair with arms (e.g., wheelchair, bedside commode, etc,.)?: Unable Help needed moving to and from a bed to chair (including a wheelchair)?: Total Help needed walking in hospital room?: Total Help needed climbing 3-5 steps with a railing? : Total 6 Click Score: 6    End of Session Equipment Utilized During Treatment: Gait belt Activity Tolerance: Patient limited by pain Patient left: in bed;with call bell/phone within reach;with family/visitor present Nurse Communication: Mobility status PT Visit Diagnosis: Unsteadiness on feet (R26.81);Pain Pain -  Right/Left: Left Pain - part of body: Knee     Time: 5456-2563 PT Time Calculation (min) (ACUTE ONLY): 30 min  Charges:  $Gait Training: 23-37 mins $T                    G CodesTresa [name] PT [phone] }   Sherry [name] 05/31/2017, 4:38 PM

## 2017-06-01 LAB — CBC WITH DIFFERENTIAL/PLATELET
Basophils Absolute: 0 10*3/uL (ref 0.0–0.1)
Basophils Relative: 0 %
Eosinophils Absolute: 0.3 10*3/uL (ref 0.0–0.7)
Eosinophils Relative: 4 %
HCT: 25.4 % — ABNORMAL LOW (ref 36.0–46.0)
Hemoglobin: 8.5 g/dL — ABNORMAL LOW (ref 12.0–15.0)
Lymphocytes Relative: 33 %
Lymphs Abs: 2.3 10*3/uL (ref 0.7–4.0)
MCH: 31.5 pg (ref 26.0–34.0)
MCHC: 33.5 g/dL (ref 30.0–36.0)
MCV: 94.1 fL (ref 78.0–100.0)
Monocytes Absolute: 1.2 10*3/uL — ABNORMAL HIGH (ref 0.1–1.0)
Monocytes Relative: 17 %
Neutro Abs: 3.2 10*3/uL (ref 1.7–7.7)
Neutrophils Relative %: 46 %
Platelets: 293 10*3/uL (ref 150–400)
RBC: 2.7 MIL/uL — ABNORMAL LOW (ref 3.87–5.11)
RDW: 13.2 % (ref 11.5–15.5)
WBC: 7 10*3/uL (ref 4.0–10.5)

## 2017-06-01 LAB — PROTIME-INR
INR: 1.44
Prothrombin Time: 17.5 seconds — ABNORMAL HIGH (ref 11.4–15.2)

## 2017-06-01 MED ORDER — ENOXAPARIN SODIUM 30 MG/0.3ML ~~LOC~~ SOLN
30.0000 mg | Freq: Two times a day (BID) | SUBCUTANEOUS | Status: DC
Start: 2017-06-01 — End: 2017-06-02
  Administered 2017-06-01 – 2017-06-02 (×2): 30 mg via SUBCUTANEOUS
  Filled 2017-06-01 (×2): qty 0.3

## 2017-06-01 MED ORDER — ENOXAPARIN SODIUM 30 MG/0.3ML ~~LOC~~ SOLN
30.0000 mg | Freq: Once | SUBCUTANEOUS | Status: DC
  Administered 2017-06-01: 30 mg via SUBCUTANEOUS
  Filled 2017-06-01: qty 0.3

## 2017-06-01 MED ORDER — SODIUM CHLORIDE 0.9 % IV BOLUS (SEPSIS)
500.0000 mL | Freq: Once | INTRAVENOUS | Status: AC
  Administered 2017-06-01: 500 mL via INTRAVENOUS

## 2017-06-01 MED ORDER — WARFARIN SODIUM 4 MG PO TABS
4.0000 mg | ORAL_TABLET | Freq: Once | ORAL | Status: DC
  Administered 2017-06-01: 4 mg via ORAL
  Filled 2017-06-01: qty 1

## 2017-06-01 NOTE — Progress Notes (Signed)
Occupational Therapy Treatment Patient Details Name: Sherry [name] MRN: [id number] DOB: 05-Apr-1946 Today's Date: 06/01/2017    History of present illness S/P bil TKA   OT comments  cotx with PT. Pt ambulated in hall.  Simulated transfer to 3:1; pt did not need to use toilet at this time.  She continues to need mod +2 to stand from a high surface and will need rehab prior to home. Husband cannot assist her.  Pt limited by pain and fatique.    Follow Up Recommendations  CIR    Equipment Recommendations  3 in 1 bedside commode    Recommendations for Other Services      Precautions / Restrictions Precautions Precautions: Knee;Fall Precaution Comments: monitor BP, HR, did not use KI on either leg Restrictions RLE Weight Bearing: Weight bearing as tolerated LLE Weight Bearing: Weight bearing as tolerated       Mobility Bed Mobility         Supine to sit: Mod assist;HOB elevated     General bed mobility comments: use of leg lifter for legs, extra time and multimodal cues  Transfers Overall transfer level: Needs assistance Equipment used: Rolling walker (2 wheeled) Transfers: Sit to/from Stand Sit to Stand: +2 physical assistance;Mod assist;From elevated surface         General transfer comment: Cues for hand and foot placement , patient able to work feet back in preparation to stand. 2 assist to stand from and cues  for step by step sequence to walk legs forward  then reach back for armrests.     Balance Overall balance assessment: Needs assistance Sitting-balance support: Feet supported;Bilateral upper extremity supported Sitting balance-Leahy Scale: Fair     Standing balance support: During functional activity;Bilateral upper extremity supported Standing balance-Leahy Scale: Poor                             ADL either performed or assessed with clinical judgement   ADL               Lower Body Bathing: Moderate assistance;Sit to/from  stand;+2 for physical assistance(simulated)                         General ADL Comments: simulated toilet transfer:  mod +2 assistance to chair. Ambulated with min +2 for safety.       Vision       Perception     Praxis      Cognition Arousal/Alertness: Lethargic;Suspect due to medications Behavior During Therapy: [facility] for tasks assessed/performed                                   General Comments: slow to respond verbally near end of session; answered appropriately but didn't really focus on person        Exercises    Shoulder Instructions       General Comments      Pertinent Vitals/ Pain       Pain Score: 10-Worst pain ever Pain Location: both knees Pain Descriptors / Indicators: Aching;Discomfort;Operative site guarding Pain Intervention(s): Limited activity within patient's tolerance;Monitored during session;Premedicated before session;Repositioned;Ice applied  Home Living  Prior Functioning/Environment              Frequency           Progress Toward Goals  OT Goals(current goals can now be found in the care plan section)  Progress towards OT goals: Progressing toward goals     Plan      Co-evaluation      Reason for Co-Treatment: For patient/therapist safety PT goals addressed during session: Mobility/safety with mobility OT goals addressed during session: ADL's and self-care      AM-PAC PT "6 Clicks" Daily Activity     Outcome Measure   Help from another person eating meals?: None Help from another person taking care of personal grooming?: A Little Help from another person toileting, which includes using toliet, bedpan, or urinal?: A Lot Help from another person bathing (including washing, rinsing, drying)?: A Little Help from another person to put on and taking off regular upper body clothing?: A Little Help from another person to put on and taking  off regular lower body clothing?: A Lot 6 Click Score: 17    End of Session    OT Visit Diagnosis: Pain Pain - part of body: Knee(bil)   Activity Tolerance Patient limited by fatigue   Patient Left in chair;with call bell/phone within reach   Nurse Communication          Time: 2130-8657 OT Time Calculation (min): 30 min  Charges: OT General Charges $OT Visit: 1 Visit OT Treatments $Self Care/Home Management : 8-22 mins  Lesle Chris, OTR/L [phone] 06/01/2017   [place] 06/01/2017, 11:44 AM

## 2017-06-01 NOTE — Progress Notes (Signed)
   Subjective: 5 Days Post-Op Procedure(s) (LRB): TOTAL KNEE BILATERAL (Bilateral) Patient reports pain as mild.   Patient seen in rounds by Dr. Wynelle Link. Patient is well, but has had some minor complaints of pain in the knees, requiring pain medications Patient is ready to go to rehab but was denied by primary insurance.  Spoke with Melissa on Friday about denial.  Dr. Synthia Innocent would like to do a peer-to-peer call today to their medical director.   Objective: Vital signs in last 24 hours: Temp:  [97.8 F (36.6 C)-98 F (36.7 C)] 98 F (36.7 C) (02/18 0629) Pulse Rate:  [109-116] 109 (02/18 0629) Resp:  [14-17] 17 (02/18 0629) BP: (118-152)/(74-77) 118/77 (02/18 0629) SpO2:  [96 %-100 %] 100 % (02/18 0629)  Intake/Output from previous day:  Intake/Output Summary (Last 24 hours) at 06/01/2017 0714 Last data filed at 06/01/2017 0700 Gross per 24 hour  Intake 900 ml  Output 3800 ml  Net -2900 ml    Intake/Output this shift: No intake/output data recorded.  Labs: Recent Labs    05/30/17 0512 05/31/17 0517 06/01/17 0602  HGB 9.6* 8.8* 8.5*   Recent Labs    05/31/17 0517 06/01/17 0602  WBC 8.8 7.0  RBC 2.74* 2.70*  HCT 26.0* 25.4*  PLT 277 293   Recent Labs    05/30/17 0512  NA 141  K 4.0  CL 104  CO2 29  BUN 11  CREATININE 0.69  GLUCOSE 107*  CALCIUM 8.6*   Recent Labs    05/31/17 0517 06/01/17 0602  INR 1.38 1.44    EXAM: General - Patient is Alert and Appropriate Extremity - Neurovascular intact Sensation intact distally Intact pulses distally Dorsiflexion/Plantar flexion intact Incision - clean, dry, no drainage Motor Function - intact, moving foot and toes well on exam.   Assessment/Plan: 5 Days Post-Op Procedure(s) (LRB): TOTAL KNEE BILATERAL (Bilateral) Procedure(s) (LRB): TOTAL KNEE BILATERAL (Bilateral) Past Medical History:  Diagnosis Date  . Anxiety   . Carpal tunnel syndrome on both sides   . Chronic back pain    herniated  disc,stenosis,radiculopathy,spondylolisthesis  . Chronic neck pain   . Constipation, chronic   . Degenerative spinal arthritis    cervical and lumbar  . Depression   . Eczema   . GERD (gastroesophageal reflux disease)   . Hemorrhoids   . Hiatal hernia   . History of bronchitis    "I've had it a number of times"  . IBS (irritable bowel syndrome)    lactose intolerant  . Osteoarthritis    bilateral knees and shoulders,  little fingers  . TMJ (dislocation of temporomandibular joint)   . Wears glasses    Principal Problem:   OA (osteoarthritis) of knee  Estimated body mass index is 24.63 kg/m as calculated from the following:   Height as of this encounter: 5\' 5"  (1.651 m).   Weight as of this encounter: 67.1 kg (148 lb). Up with therapy Diet - Regular diet Activity - WBAT Disposition - pending DVT Prophylaxis - Lovenox and Coumadin, INR 1.44  Arlee Muslim, PA-C Orthopaedic Surgery 06/01/2017, 7:14 AM

## 2017-06-01 NOTE — Progress Notes (Signed)
Friend for warfarin Indication: VTE prophylaxis status post TKA   Allergies  Allergen Reactions  . Adhesive [Tape] Other (See Comments)    Blisters; "gets puffy and red" Liquid Bandaids    Patient Measurements: Height: 5\' 5"  (165.1 cm) Weight: 148 lb (67.1 kg) IBW/kg (Calculated) : 57  Vital Signs: Temp: 98 F (36.7 C) (02/18 0629) Temp Source: Oral (02/18 0629) BP: 118/77 (02/18 0629) Pulse Rate: 109 (02/18 0629)  Labs: Recent Labs    05/30/17 0512 05/31/17 0517 06/01/17 0602  HGB 9.6* 8.8* 8.5*  HCT 29.6* 26.0* 25.4*  PLT 266 277 293  LABPROT 14.0 16.9* 17.5*  INR 1.09 1.38 1.44  CREATININE 0.69  --   --     Estimated Creatinine Clearance: 58.9 mL/min (by C-G formula based on SCr of 0.69 mg/dL).   Medical History: Past Medical History:  Diagnosis Date  . Anxiety   . Carpal tunnel syndrome on both sides   . Chronic back pain    herniated disc,stenosis,radiculopathy,spondylolisthesis  . Chronic neck pain   . Constipation, chronic   . Degenerative spinal arthritis    cervical and lumbar  . Depression   . Eczema   . GERD (gastroesophageal reflux disease)   . Hemorrhoids   . Hiatal hernia   . History of bronchitis    "I've had it a number of times"  . IBS (irritable bowel syndrome)    lactose intolerant  . Osteoarthritis    bilateral knees and shoulders,  little fingers  . TMJ (dislocation of temporomandibular joint)   . Wears glasses     Assessment: 75 YOF with history of BL osteoarthritis of the knees. Presented to [facility] 2/13 for BL TKA. Patient not on anticoagulation prior to admission. Coumadin ordered post op for VTE prophylaxis. Ortho team plans to continue with Coumadin for 4 weeks, then transition to aspirin 81mg  daily.  - epidural removed on 2/15 - lovenox 30 mg q12h started on 2/15 PM -   Today, 06/01/2017: - INR is subtherapeutic at 1.44 but is trending up - hgb dec to 8.5, plts WNL -  no bleeding documented - no significant drug-drug intxns - regular diet  Goal of Therapy:  INR 2-3  Plan:  - repeat warfarin 4 mg PO x1 today  - daily INR - stop enoxaparin when INR therapeutic - Monitor for signs and symptoms of bleeding  Dia Sitter, PharmD, BCPS 06/01/2017 10:10 AM

## 2017-06-01 NOTE — Progress Notes (Signed)
Physical Therapy Treatment Patient Details Name: Sherry [name] MRN: [id number] DOB: 1945/09/05 Today's Date: 06/01/2017    History of Present Illness S/P bil TKA    PT Comments    POD # 5 Pt progressing slowly.  Assisted OOB to amb a limited distance with + 2 assist.  Assisted back to bed bed.  Performed HS x 10 reps and applied ICE.   Follow Up Recommendations  CIR     Equipment Recommendations  Rolling walker with 5" wheels    Recommendations for Other Services       Precautions / Restrictions Precautions Precautions: Knee;Fall Precaution Comments: D/C KI Restrictions Weight Bearing Restrictions: No RLE Weight Bearing: Weight bearing as tolerated LLE Weight Bearing: Weight bearing as tolerated    Mobility  Bed Mobility Overal bed mobility: Needs Assistance Bed Mobility: Sit to Supine;Supine to Sit     Supine to sit: Mod assist;HOB elevated Sit to supine: Mod assist   General bed mobility comments: use of leg lifter for legs, extra time and multimodal cues  Transfers Overall transfer level: Needs assistance Equipment used: Rolling walker (2 wheeled) Transfers: Sit to/from Stand Sit to Stand: +2 physical assistance;Mod assist;From elevated surface         General transfer comment: Cues for hand and foot placement , patient able to work feet back in preparation to stand. 2 assist to stand from and cues  for step by step sequence to walk legs forward  then reach back for armrests.   Ambulation/Gait Ambulation/Gait assistance: Mod assist;+2 physical assistance;+2 safety/equipment Ambulation Distance (Feet): 18 Feet Assistive device: Rolling walker (2 wheeled) Gait Pattern/deviations: Step-to pattern;Decreased step length - right;Decreased step length - left;Decreased stride length;Antalgic;Trunk flexed Gait velocity: decreased   General Gait Details: Cues for LE advancement, upper trunk control, posture and RW positioning and sequencing.  Close chair  follow for safety.     Stairs            Wheelchair Mobility    Modified Rankin (Stroke Patients Only)       Balance                                            Cognition Arousal/Alertness: (slow, groggy (meds?))                                     General Comments: slow to respond verbally; answered appropriately but didn't really focus on person      Exercises      General Comments        Pertinent Vitals/Pain Pain Assessment: Faces Faces Pain Scale: Hurts even more Pain Location: both knees  everytime pain level is asked, pt immediately always says 10 Pain Descriptors / Indicators: Aching;Discomfort;Operative site guarding Pain Intervention(s): Monitored during session;Repositioned;Ice applied;Premedicated before session    Home Living                      Prior Function Level of Independence: Independent          PT Goals (current goals can now be found in the care plan section) Progress towards PT goals: Progressing toward goals    Frequency    7X/week      PT Plan Current plan remains appropriate    Co-evaluation  AM-PAC PT "6 Clicks" Daily Activity  Outcome Measure  Difficulty turning over in bed (including adjusting bedclothes, sheets and blankets)?: Unable Difficulty moving from lying on back to sitting on the side of the bed? : Unable Difficulty sitting down on and standing up from a chair with arms (e.g., wheelchair, bedside commode, etc,.)?: Unable Help needed moving to and from a bed to chair (including a wheelchair)?: Total Help needed walking in hospital room?: Total Help needed climbing 3-5 steps with a railing? : Total 6 Click Score: 6    End of Session Equipment Utilized During Treatment: Gait belt Activity Tolerance: Patient limited by fatigue;Patient limited by pain Patient left: in bed;with bed alarm set;with call bell/phone within reach Nurse Communication:  Mobility status PT Visit Diagnosis: Unsteadiness on feet (R26.81);Pain Pain - Right/Left: (both) Pain - part of body: Knee     Time: 1355-1420 PT Time Calculation (min) (ACUTE ONLY): 25 min  Charges:  $Gait Training: 8-22 mins $Therapeutic Activity: 8-22 mins                    G Codes:       Rica Koyanagi  PTA [facility]  Acute  Rehab Pager      [phone]

## 2017-06-01 NOTE — Progress Notes (Signed)
Physical Therapy Treatment Patient Details Name: Sherry [name] MRN: [id number] DOB: 22-Jan-1946 Today's Date: 06/01/2017    History of Present Illness S/P bil TKA    PT Comments    The patient continues to be catatonic like, drowsy and dozes quickly.   Follow Up Recommendations  CIR     Equipment Recommendations  Rolling walker with 5" wheels    Recommendations for Other Services Rehab consult     Precautions / Restrictions Precautions Precautions: Knee;Fall Precaution Comments: monitor BP, HR, did not use KI on either leg Restrictions RLE Weight Bearing: Weight bearing as tolerated LLE Weight Bearing: Weight bearing as tolerated    Mobility  Bed Mobility         Supine to sit: Mod assist;HOB elevated     General bed mobility comments: use of leg lifter for legs, extra time and multimodal cues  Transfers Overall transfer level: Needs assistance Equipment used: Rolling walker (2 wheeled) Transfers: Sit to/from Stand Sit to Stand: +2 physical assistance;Mod assist;From elevated surface         General transfer comment: Cues for hand and foot placement , patient able to work feet back in preparation to stand. 2 assist to stand from and cues  for step by step sequence to walk legs forward  then reach back for armrests.   Ambulation/Gait Ambulation/Gait assistance: Mod assist;+2 physical assistance;+2 safety/equipment Ambulation Distance (Feet): 20 Feet Assistive device: Rolling walker (2 wheeled) Gait Pattern/deviations: Step-to pattern;Decreased step length - right;Decreased step length - left;Decreased stride length;Antalgic;Trunk flexed Gait velocity: decreased   General Gait Details: Cues for LE advancement, upper trunk control, posture and RW positioning and sequencing.  Close chair follow for safety.     Stairs            Wheelchair Mobility    Modified Rankin (Stroke Patients Only)       Balance Overall balance assessment: Needs  assistance Sitting-balance support: Feet supported;Bilateral upper extremity supported Sitting balance-Leahy Scale: Fair     Standing balance support: During functional activity;Bilateral upper extremity supported Standing balance-Leahy Scale: Poor                              Cognition Arousal/Alertness: Lethargic;Suspect due to medications                                            Exercises Total Joint Exercises Long Arc Quad: AAROM;Both;10 reps;Seated    General Comments        Pertinent Vitals/Pain Pain Score: 10-Worst pain ever Pain Location: both knees Pain Descriptors / Indicators: Aching;Discomfort;Operative site guarding Pain Intervention(s): Monitored during session;Premedicated before session    Home Living                      Prior Function            PT Goals (current goals can now be found in the care plan section) Progress towards PT goals: Progressing toward goals    Frequency    7X/week      PT Plan Current plan remains appropriate    Co-evaluation PT/OT/SLP Co-Evaluation/Treatment: Yes Reason for Co-Treatment: For patient/therapist safety PT goals addressed during session: Mobility/safety with mobility        AM-PAC PT "6 Clicks" Daily Activity  Outcome Measure  Difficulty turning over  in bed (including adjusting bedclothes, sheets and blankets)?: Unable Difficulty moving from lying on back to sitting on the side of the bed? : Unable Difficulty sitting down on and standing up from a chair with arms (e.g., wheelchair, bedside commode, etc,.)?: Unable Help needed moving to and from a bed to chair (including a wheelchair)?: Total Help needed walking in hospital room?: Total Help needed climbing 3-5 steps with a railing? : Total 6 Click Score: 6    End of Session Equipment Utilized During Treatment: Gait belt Activity Tolerance: Patient limited by fatigue;Patient limited by pain Patient left:  in chair;with call bell/phone within reach Nurse Communication: Mobility status PT Visit Diagnosis: Unsteadiness on feet (R26.81);Pain Pain - Right/Left: Left Pain - part of body: Knee     Time: 4158-3094 PT Time Calculation (min) (ACUTE ONLY): 28 min  Charges:  $Gait Training: 8-22 mins                    G Codes:          Claretha Cooper 06/01/2017, 11:10 AM

## 2017-06-01 NOTE — Progress Notes (Signed)
Inpatient Rehabilitation  Spoke with nurse case manager from [facility].  Request for peer to peer review/appeal is in and was in prior to 3pm deadline.  The Medical Director will reach out and discuss case with Dr.A per her report.  Plan to update the team as I know.  Call if questions.   Carmelia Roller., CCC/SLP Admission Coordinator  [place]  Cell [phone]

## 2017-06-01 NOTE — Progress Notes (Addendum)
Inpatient Rehabilitation  Note plans to pursue a peer-to-peer review today and this is available until 3pm.  Per [facility] [employer] I need to call into their case manager and notify of Dr. Peri Maris availability and best contact number and the [facility] Medicare Medical Director will call him.  I have called the office and left this message for [facility] and Dr. Maureen Ralphs.  Please call with questions.   Update: Dr. Loni Muse requested that medical director call him at 11am message given to [facility] Medicare nurse case manager.  As of 12:15 continuing to await call from [facility] Director.  Called and requested a time line from nurse case manager.  Will update team as I know.  Call if questions.     Carmelia Roller., CCC/SLP Admission Coordinator  [place]  Cell [phone]

## 2017-06-01 NOTE — PMR Pre-admission (Signed)
PMR Admission Coordinator Pre-Admission Assessment  Patient: Sherry [name] is an 72 y.o., female MRN: [id number] DOB: 09-26-1945 Height: '5\' 5"'  (165.1 cm) Weight: 67.1 kg (148 lb)              Insurance Information HMO:     PPO:      PCP:      IPA:      80/20:      OTHER:  PRIMARY: U[employer] Medicare       Policy#: [id number]      Subscriber: Self CM Name: Sharee Pimple      Phone#: [phone]     Fax#: [phone] Pre-Cert#: [id number]      Employer: Retired Benefits:  Phone #: [phone]     Name: Verified online at [email or web address] Eff. Date: 04/14/17     Deduct: $0      Out of Pocket Max: $4500      Life Max: N/A CIR: $345 a day, days 1-5; $0 a day, days 6+      SNF: $0 a day, days 1-20; $160 a day, days 21-49; $0 a day, days 50-100 Outpatient: Necessity     Co-Pay: $40 per visit  Home Health: Necessity, 100%       Co-Pay: $0 DME: 80%     Co-Pay: 20% Providers: In-network   SECONDARY: None        Medicaid Application Date:       Case Manager:  Disability Application Date:       Case Worker:   Emergency Contact Information Contact Information    Name Relation Home Work Mobile   Rastetter,Richard A Spouse [phone]   Haynes,Kristen Daughter [phone]  [phone]     Current Medical History  Patient Admitting Diagnosis: Bilateral total knee replacements   History of Present Illness: 73 year old right-handed female with history of chronic back and neck pain as well as osteoarthritis degenerative changes bilateral knees. Per chart review patient lives with spouse. Independent prior to admission and active. She takes care of her grandchildren. Two-level home with 2 steps to entry. Bed and bath are upstairs. Presented 05/27/2017 with progressive knee pain bilaterally. No relief with cortisone injections exercise modifications. Underwent bilateral total knee arthroplasty 05/27/2017 per Dr. Maureen Ralphs. Hospital course pain management. Patient initially had an epidural in place.  Weightbearing as tolerated bilateral lower extremities. Placed on Coumadin for DVT prophylaxis. Acute blood loss anemia 8.5 monitored. Physical and occupational therapy evaluations completed with recommendations of physical medicine rehabilitation consult and patient was admitted for a comprehensive rehabilitation program 06/02/17.        Past Medical History  Past Medical History:  Diagnosis Date  . Anxiety   . Carpal tunnel syndrome on both sides   . Chronic back pain    herniated disc,stenosis,radiculopathy,spondylolisthesis  . Chronic neck pain   . Constipation, chronic   . Degenerative spinal arthritis    cervical and lumbar  . Depression   . Eczema   . GERD (gastroesophageal reflux disease)   . Hemorrhoids   . Hiatal hernia   . History of bronchitis    "I've had it a number of times"  . IBS (irritable bowel syndrome)    lactose intolerant  . Osteoarthritis    bilateral knees and shoulders,  little fingers  . TMJ (dislocation of temporomandibular joint)   . Wears glasses     Family History  family history includes Breast cancer in her paternal aunt; Colon cancer in her maternal grandfather; Kidney disease in her father; Leukemia  in her sister; Parkinson's disease in her mother; Rheum arthritis in her father.  Prior Rehab/Hospitalizations:  Has the patient had major surgery during 100 days prior to admission? No  Current Medications   Current Facility-Administered Medications:  .  0.9 %  sodium chloride infusion, , Intravenous, Continuous, Perkins, Alexzandrew L, PA-C, Last Rate: 50 mL/hr at 05/29/17 1146 .  acetaminophen (TYLENOL) tablet 650 mg, 650 mg, Oral, Q4H PRN, 650 mg at 06/01/17 1339 **OR** acetaminophen (TYLENOL) suppository 650 mg, 650 mg, Rectal, Q4H PRN, Aluisio, Frank, MD .  bisacodyl (DULCOLAX) suppository 10 mg, 10 mg, Rectal, Daily PRN, Aluisio, Pilar Plate, MD .  diazepam (VALIUM) tablet 5-10 mg, 5-10 mg, Oral, Q6H PRN, Gaynelle Arabian, MD, 5 mg at 06/02/17  0518 .  dicyclomine (BENTYL) capsule 10 mg, 10 mg, Oral, TID PRN, Gaynelle Arabian, MD, 10 mg at 05/28/17 0511 .  diphenhydrAMINE (BENADRYL) 12.5 MG/5ML elixir 12.5-25 mg, 12.5-25 mg, Oral, Q4H PRN, Aluisio, Frank, MD .  docusate sodium (COLACE) capsule 100 mg, 100 mg, Oral, BID, Aluisio, Frank, MD, 100 mg at 06/01/17 2357 .  enoxaparin (LOVENOX) injection 30 mg, 30 mg, Subcutaneous, Q12H, Aluisio, Frank, MD, 30 mg at 06/01/17 2357 .  famotidine (PEPCID) tablet 20 mg, 20 mg, Oral, PRN, Gaynelle Arabian, MD, 20 mg at 05/29/17 1450 .  gabapentin (NEURONTIN) capsule 300 mg, 300 mg, Oral, TID, Aluisio, Frank, MD, 300 mg at 06/01/17 2357 .  loratadine (CLARITIN) tablet 10 mg, 10 mg, Oral, Daily PRN, Aluisio, Frank, MD .  menthol-cetylpyridinium (CEPACOL) lozenge 3 mg, 1 lozenge, Oral, PRN **OR** phenol (CHLORASEPTIC) mouth spray 1 spray, 1 spray, Mouth/Throat, PRN, Aluisio, Frank, MD .  metoCLOPramide (REGLAN) tablet 5-10 mg, 5-10 mg, Oral, Q8H PRN **OR** metoCLOPramide (REGLAN) injection 5-10 mg, 5-10 mg, Intravenous, Q8H PRN, Aluisio, Frank, MD .  morphine 2 MG/ML injection 1-2 mg, 1-2 mg, Intravenous, Q3H PRN, Swinteck, Aaron Edelman, MD .  naloxegol oxalate (MOVANTIK) tablet 25 mg, 25 mg, Oral, Daily, Aluisio, Frank, MD, 25 mg at 06/01/17 0803 .  omeprazole (PRILOSEC) capsule 20 mg, 20 mg, Oral, Daily PRN, Perkins, Alexzandrew L, PA-C, 20 mg at 06/01/17 2033 .  ondansetron (ZOFRAN) tablet 4 mg, 4 mg, Oral, Q6H PRN **OR** ondansetron (ZOFRAN) injection 4 mg, 4 mg, Intravenous, Q6H PRN, Aluisio, Frank, MD .  oxyCODONE (Oxy IR/ROXICODONE) immediate release tablet 10-20 mg, 10-20 mg, Oral, Q3H PRN, Gaynelle Arabian, MD, 10 mg at 06/02/17 0518 .  oxyCODONE (Oxy IR/ROXICODONE) immediate release tablet 5 mg, 5 mg, Oral, Q3H PRN, Gaynelle Arabian, MD, 5 mg at 06/02/17 0010 .  polyethylene glycol (MIRALAX / GLYCOLAX) packet 17 g, 17 g, Oral, Vela Prose, MD, 17 g at 06/01/17 0803 .  polyethylene glycol (MIRALAX /  GLYCOLAX) packet 17 g, 17 g, Oral, Daily PRN, Gaynelle Arabian, MD, 17 g at 05/29/17 0906 .  sodium phosphate (FLEET) 7-19 GM/118ML enema 1 enema, 1 enema, Rectal, Once PRN, Gaynelle Arabian, MD .  Warfarin - Pharmacist Dosing Inpatient, , Does not apply, q1800, Gaynelle Arabian, MD  Patients Current Diet: Diet regular Room service appropriate? Yes; Fluid consistency: Thin Diet general  Precautions / Restrictions Precautions Precautions: Knee, Fall Precaution Comments: D/C KI Restrictions Weight Bearing Restrictions: No RLE Weight Bearing: Weight bearing as tolerated LLE Weight Bearing: Weight bearing as tolerated Other Position/Activity Restrictions: WBAT   Has the patient had 2 or more falls or a fall with injury in the past year?No  Prior Activity Level Community (5-7x/wk): Prior to admission patient was fully independent and active.  She had the most difficulty with stairs, bending, and lifting.   Home Assistive Devices / Equipment Home Assistive Devices/Equipment: Eyeglasses, Radio producer (specify quad or straight), Grab bars in shower, Walker (specify type), Bedside commode/3-in-1, Shower chair with back Home Equipment: Walker - 2 wheels, Shower seat, Adaptive equipment, Grab bars - toilet, Grab bars - tub/shower  Prior Device Use: Indicate devices/aids used by the patient prior to current illness, exacerbation or injury? None of the above  Prior Functional Level Prior Function Level of Independence: Independent Comments: cares for grandchildren. Could not carry baby upstairs  Self Care: Did the patient need help bathing, dressing, using the toilet or eating? Independent  Indoor Mobility: Did the patient need assistance with walking from room to room (with or without device)? Independent  Stairs: Did the patient need assistance with internal or external stairs (with or without device)? Independent  Functional Cognition: Did the patient need help planning regular tasks such as shopping  or remembering to take medications? Independent  Current Functional Level Cognition  Overall Cognitive Status: Within Functional Limits for tasks assessed Orientation Level: Oriented X4 General Comments: slow to respond verbally; answered appropriately but didn't really focus on person    Extremity Assessment (includes Sensation/Coordination)  Upper Extremity Assessment: Defer to OT evaluation  Lower Extremity Assessment: RLE deficits/detail, LLE deficits/detail RLE Deficits / Details: 10-45 knee flexion, 3/5 quad strength LLE Deficits / Details: 10-50 knee flexion, 3+ quad stength    ADLs  Overall ADL's : Needs assistance/impaired Eating/Feeding: Independent Grooming: Set up, Sitting Upper Body Bathing: Set up, Sitting Lower Body Bathing: Moderate assistance, Sit to/from stand, +2 for physical assistance(simulated) Upper Body Dressing : Minimal assistance, Sitting(lines) Lower Body Dressing: Total assistance, +2 for physical assistance, Sit to/from stand General ADL Comments: simulated toilet transfer:  mod +2 assistance to chair. Ambulated with min +2 for safety.      Mobility  Overal bed mobility: Needs Assistance Bed Mobility: Sit to Supine, Supine to Sit Supine to sit: Mod assist, HOB elevated Sit to supine: Mod assist General bed mobility comments: use of leg lifter for legs, extra time and multimodal cues    Transfers  Overall transfer level: Needs assistance Equipment used: Rolling walker (2 wheeled) Transfers: Sit to/from Stand Sit to Stand: +2 physical assistance, Mod assist, From elevated surface General transfer comment: Cues for hand and foot placement , patient able to work feet back in preparation to stand. 2 assist to stand from and cues  for step by step sequence to walk legs forward  then reach back for armrests.     Ambulation / Gait / Stairs / Wheelchair Mobility  Ambulation/Gait Ambulation/Gait assistance: Mod assist, +2 physical assistance, +2  safety/equipment Ambulation Distance (Feet): 18 Feet Assistive device: Rolling walker (2 wheeled) Gait Pattern/deviations: Step-to pattern, Decreased step length - right, Decreased step length - left, Decreased stride length, Antalgic, Trunk flexed General Gait Details: Cues for LE advancement, upper trunk control, posture and RW positioning and sequencing.  Close chair follow for safety.   Gait velocity: decreased Gait velocity interpretation: Below normal speed for age/gender    Posture / Balance Balance Overall balance assessment: Needs assistance Sitting-balance support: Feet supported, Bilateral upper extremity supported Sitting balance-Leahy Scale: Fair Standing balance support: During functional activity, Bilateral upper extremity supported Standing balance-Leahy Scale: Poor    Special needs/care consideration BiPAP/CPAP: No CPM: Yes, bilateral  Continuous Drip IV: No Dialysis: No         Life Vest: No Oxygen: No Special Bed: No  Trach Size: No Wound Vac (area): No       Skin: WDL                               Bowel mgmt: Continent, last BM 05/27/17 Bladder mgmt: Continent with change in documentation to amber and cloudy 06/01/17 Diabetic mgmt: No     Previous Home Environment Living Arrangements: Spouse/significant other Available Help at Discharge: Family Type of Home: House Home Layout: Two level Alternate Level Stairs-Rails: Right Alternate Level Stairs-Number of Steps: 13 Home Access: Stairs to enter [employer] of Steps: 2 Bathroom Shower/Tub: Multimedia programmer: Rossie: No Additional Comments: bed and bath upstairs. Walk in shower with seat. HH shower, higher commodes  Discharge Living Setting Plans for Discharge Living Setting: Patient's home, Lives with (comment)(Spouse) Type of Home at Discharge: House Discharge Home Layout: Two level Alternate Level Stairs-Rails: Right Alternate Level Stairs-Number of Steps:  14-15 steps, 1 flight Discharge Home Access: Stairs to enter Entrance Stairs-Rails: None Entrance Stairs-Number of Steps: 2 Discharge Bathroom Shower/Tub: Walk-in shower Discharge Bathroom Toilet: Handicapped height Discharge Bathroom Accessibility: Yes How Accessible: Accessible via walker Does the patient have any problems obtaining your medications?: No  Social/Family/Support Systems Patient Roles: Spouse, Parent, Other (Comment)(Grandparent ) Contact Information: Spouse: Brityn Mastrogiovanni [phone] Anticipated Caregiver: None Mod I goals  Anticipated Caregiver's Contact Information: N/A Ability/Limitations of Caregiver: Spouse with recent lower back injury  Caregiver Availability: 24/7 Discharge Plan Discussed with Primary Caregiver: Yes Is Caregiver In Agreement with Plan?: Yes Does Caregiver/Family have Issues with Lodging/Transportation while Pt is in Rehab?: No  Goals/Additional Needs Patient/Family Goal for Rehab: PT/OT: Mod I  Expected length of stay: 7-10 days  Cultural Considerations: None Dietary Needs: Regular textures and thin liquids  Equipment Needs: TBD Special Service Needs: None Additional Information: None Pt/Family Agrees to Admission and willing to participate: Yes Program Orientation Provided & Reviewed with Pt/Caregiver Including Roles  & Responsibilities: Yes  Barriers to Discharge: Home environment access/layout  Decrease burden of Care through IP rehab admission: No  Possible need for SNF placement upon discharge: No  Patient Condition: I have reviewed patient's medical record and met with patient at bedside.  Patient is eager to get to rehab to facilitate her safe transition home.  Given that patient was very active and independent living in a multi-level home prior to admission she makes an excellent candidate for an IP Rehab admission.  Patient requires ongoing PT and OT therapies and would greatly benefit from the coordinated care of the IP Rehab  team.  She will receive 3 hours of skilled therapy a day (5/7 days of the week) with MD and nursing interventions to manage her pain, INR, and anemia.  Current performance with ADLs is Mod A +2 for transfers and gait.    Preadmission Screen Completed By:  Gunnar Fusi, 06/02/2017 9:11 AM ______________________________________________________________________   Discussed status with Dr. Naaman Plummer on 06/02/17 at 0910 and received telephone approval for admission today.  Admission Coordinator:  Gunnar Fusi, time 0910/Date 06/02/17

## 2017-06-01 NOTE — Care Management Important Message (Signed)
Important Message  Patient Details  Name: Sherry [name] MRN: [id number] Date of Birth: [birth year]   Medicare Important Message Given:  Yes    Kerin Salen 06/01/2017, 10:26 AMImportant Message  Patient Details  Name: Sherry [name] MRN: [id number] Date of Birth: April 25, 1945   Medicare Important Message Given:  Yes    Kerin Salen 06/01/2017, 10:25 AM

## 2017-06-02 ENCOUNTER — Other Ambulatory Visit: Payer: Self-pay

## 2017-06-02 ENCOUNTER — Inpatient Hospital Stay (HOSPITAL_COMMUNITY)
Admission: RE | Admit: 2017-06-02 | Discharge: 2017-06-13 | DRG: 560 | Disposition: A | Discharge status: Home Health | Payer: Medicare Other | Source: Intra-hospital | Attending: Physical Medicine & Rehabilitation | Admitting: Physical Medicine & Rehabilitation

## 2017-06-02 ENCOUNTER — Encounter (HOSPITAL_COMMUNITY): Payer: Self-pay

## 2017-06-02 DIAGNOSIS — G8929 Other chronic pain: Secondary | ICD-10-CM | POA: Diagnosis present

## 2017-06-02 DIAGNOSIS — G479 Sleep disorder, unspecified: Secondary | ICD-10-CM | POA: Diagnosis not present

## 2017-06-02 DIAGNOSIS — D62 Acute posthemorrhagic anemia: Secondary | ICD-10-CM | POA: Diagnosis present

## 2017-06-02 DIAGNOSIS — K5903 Drug induced constipation: Secondary | ICD-10-CM

## 2017-06-02 DIAGNOSIS — Z91048 Other nonmedicinal substance allergy status: Secondary | ICD-10-CM

## 2017-06-02 DIAGNOSIS — F411 Generalized anxiety disorder: Secondary | ICD-10-CM | POA: Diagnosis not present

## 2017-06-02 DIAGNOSIS — Z981 Arthrodesis status: Secondary | ICD-10-CM | POA: Diagnosis not present

## 2017-06-02 DIAGNOSIS — F329 Major depressive disorder, single episode, unspecified: Secondary | ICD-10-CM

## 2017-06-02 DIAGNOSIS — F419 Anxiety disorder, unspecified: Secondary | ICD-10-CM | POA: Diagnosis present

## 2017-06-02 DIAGNOSIS — K219 Gastro-esophageal reflux disease without esophagitis: Secondary | ICD-10-CM | POA: Diagnosis present

## 2017-06-02 DIAGNOSIS — G8918 Other acute postprocedural pain: Secondary | ICD-10-CM | POA: Diagnosis not present

## 2017-06-02 DIAGNOSIS — Z96653 Presence of artificial knee joint, bilateral: Secondary | ICD-10-CM

## 2017-06-02 DIAGNOSIS — Z79899 Other long term (current) drug therapy: Secondary | ICD-10-CM

## 2017-06-02 DIAGNOSIS — M542 Cervicalgia: Secondary | ICD-10-CM | POA: Diagnosis present

## 2017-06-02 DIAGNOSIS — Z8261 Family history of arthritis: Secondary | ICD-10-CM | POA: Diagnosis not present

## 2017-06-02 DIAGNOSIS — M549 Dorsalgia, unspecified: Secondary | ICD-10-CM | POA: Diagnosis present

## 2017-06-02 DIAGNOSIS — R000 Tachycardia, unspecified: Secondary | ICD-10-CM | POA: Diagnosis present

## 2017-06-02 DIAGNOSIS — K5909 Other constipation: Secondary | ICD-10-CM | POA: Diagnosis present

## 2017-06-02 DIAGNOSIS — M7989 Other specified soft tissue disorders: Secondary | ICD-10-CM | POA: Diagnosis not present

## 2017-06-02 DIAGNOSIS — Z7901 Long term (current) use of anticoagulants: Secondary | ICD-10-CM

## 2017-06-02 DIAGNOSIS — R791 Abnormal coagulation profile: Secondary | ICD-10-CM

## 2017-06-02 DIAGNOSIS — Z87891 Personal history of nicotine dependence: Secondary | ICD-10-CM

## 2017-06-02 DIAGNOSIS — E46 Unspecified protein-calorie malnutrition: Secondary | ICD-10-CM | POA: Diagnosis not present

## 2017-06-02 DIAGNOSIS — K589 Irritable bowel syndrome without diarrhea: Secondary | ICD-10-CM | POA: Diagnosis present

## 2017-06-02 DIAGNOSIS — Z471 Aftercare following joint replacement surgery: Secondary | ICD-10-CM | POA: Diagnosis present

## 2017-06-02 DIAGNOSIS — E8809 Other disorders of plasma-protein metabolism, not elsewhere classified: Secondary | ICD-10-CM

## 2017-06-02 LAB — PROTIME-INR
INR: 1.66
Prothrombin Time: 19.5 seconds — ABNORMAL HIGH (ref 11.4–15.2)

## 2017-06-02 MED ORDER — NALOXEGOL OXALATE 25 MG PO TABS: 25.0000 mg | ORAL_TABLET | Freq: Every day | ORAL | 0 refills | Status: DC

## 2017-06-02 MED ORDER — DOCUSATE SODIUM 100 MG PO CAPS: 100.0000 mg | ORAL_CAPSULE | Freq: Two times a day (BID) | ORAL | 0 refills | Status: DC

## 2017-06-02 MED ORDER — POLYETHYLENE GLYCOL 3350 17 G PO PACK
17.0000 g | PACK | ORAL | Status: DC
  Administered 2017-06-03 – 2017-06-11 (×6): 17 g via ORAL
  Filled 2017-06-02 (×7): qty 1

## 2017-06-02 MED ORDER — GABAPENTIN 300 MG PO CAPS
300.0000 mg | ORAL_CAPSULE | Freq: Three times a day (TID) | ORAL | Status: DC
  Administered 2017-06-02 – 2017-06-13 (×33): 300 mg via ORAL
  Filled 2017-06-02 (×33): qty 1

## 2017-06-02 MED ORDER — ONDANSETRON HCL 4 MG/2ML IJ SOLN: 4.0000 mg | Freq: Four times a day (QID) | INTRAMUSCULAR | Status: DC | PRN

## 2017-06-02 MED ORDER — ACETAMINOPHEN 325 MG PO TABS: 650.0000 mg | ORAL_TABLET | ORAL | 0 refills | Status: DC | PRN

## 2017-06-02 MED ORDER — ENOXAPARIN SODIUM 30 MG/0.3ML ~~LOC~~ SOLN
30.0000 mg | Freq: Two times a day (BID) | SUBCUTANEOUS | Status: DC
  Administered 2017-06-02 – 2017-06-08 (×12): 30 mg via SUBCUTANEOUS
  Filled 2017-06-02 (×13): qty 0.3

## 2017-06-02 MED ORDER — BISACODYL 10 MG RE SUPP: 10.0000 mg | Freq: Every day | RECTAL | Status: DC | PRN

## 2017-06-02 MED ORDER — DIAZEPAM 5 MG PO TABS: 5.0000 mg | ORAL_TABLET | Freq: Four times a day (QID) | ORAL | 0 refills | Status: DC | PRN

## 2017-06-02 MED ORDER — NALOXEGOL OXALATE 25 MG PO TABS
25.0000 mg | ORAL_TABLET | Freq: Every day | ORAL | Status: DC
  Administered 2017-06-03 – 2017-06-13 (×11): 25 mg via ORAL
  Filled 2017-06-02 (×11): qty 1

## 2017-06-02 MED ORDER — FLEET ENEMA 7-19 GM/118ML RE ENEM: 1.0000 | ENEMA | Freq: Once | RECTAL | 0 refills | Status: DC | PRN

## 2017-06-02 MED ORDER — GABAPENTIN 300 MG PO CAPS: 300.0000 mg | ORAL_CAPSULE | Freq: Three times a day (TID) | ORAL | 0 refills | Status: DC

## 2017-06-02 MED ORDER — SORBITOL 70 % SOLN
30.0000 mL | Freq: Every day | Status: DC | PRN
  Filled 2017-06-02: qty 30

## 2017-06-02 MED ORDER — ONDANSETRON HCL 4 MG PO TABS: 4.0000 mg | ORAL_TABLET | Freq: Four times a day (QID) | ORAL | Status: DC | PRN

## 2017-06-02 MED ORDER — ENOXAPARIN SODIUM 30 MG/0.3ML ~~LOC~~ SOLN: 30.0000 mg | Freq: Two times a day (BID) | SUBCUTANEOUS | 0 refills | Status: DC

## 2017-06-02 MED ORDER — WARFARIN SODIUM 2 MG PO TABS
4.0000 mg | ORAL_TABLET | Freq: Once | ORAL | Status: AC
  Administered 2017-06-02: 4 mg via ORAL
  Filled 2017-06-02: qty 2

## 2017-06-02 MED ORDER — DICYCLOMINE HCL 10 MG PO CAPS
10.0000 mg | ORAL_CAPSULE | Freq: Three times a day (TID) | ORAL | Status: DC | PRN
  Administered 2017-06-04 – 2017-06-07 (×6): 10 mg via ORAL
  Filled 2017-06-02 (×8): qty 1

## 2017-06-02 MED ORDER — ONDANSETRON HCL 4 MG PO TABS: 4.0000 mg | ORAL_TABLET | Freq: Four times a day (QID) | ORAL | 0 refills | Status: DC | PRN

## 2017-06-02 MED ORDER — DOCUSATE SODIUM 100 MG PO CAPS
100.0000 mg | ORAL_CAPSULE | Freq: Two times a day (BID) | ORAL | Status: DC
  Administered 2017-06-02 – 2017-06-13 (×22): 100 mg via ORAL
  Filled 2017-06-02 (×22): qty 1

## 2017-06-02 MED ORDER — FLEET ENEMA 7-19 GM/118ML RE ENEM
1.0000 | ENEMA | Freq: Every day | RECTAL | Status: DC | PRN
  Administered 2017-06-02: 1 via RECTAL
  Filled 2017-06-02: qty 1

## 2017-06-02 MED ORDER — OXYCODONE HCL 10 MG PO TABS: 10.0000 mg | ORAL_TABLET | ORAL | 0 refills | Status: DC | PRN

## 2017-06-02 MED ORDER — FAMOTIDINE 20 MG PO TABS
20.0000 mg | ORAL_TABLET | ORAL | Status: DC | PRN
  Administered 2017-06-02 – 2017-06-11 (×13): 20 mg via ORAL
  Filled 2017-06-02 (×15): qty 1

## 2017-06-02 MED ORDER — DIAZEPAM 5 MG PO TABS
5.0000 mg | ORAL_TABLET | Freq: Four times a day (QID) | ORAL | Status: DC | PRN
  Administered 2017-06-02 – 2017-06-10 (×3): 10 mg via ORAL
  Administered 2017-06-12: 5 mg via ORAL
  Filled 2017-06-02: qty 1
  Filled 2017-06-02 (×3): qty 2

## 2017-06-02 MED ORDER — WARFARIN SODIUM 4 MG PO TABS: 4.0000 mg | ORAL_TABLET | Freq: Once | ORAL | Status: DC

## 2017-06-02 MED ORDER — BISACODYL 10 MG RE SUPP: 10.0000 mg | Freq: Every day | RECTAL | 0 refills | Status: DC | PRN

## 2017-06-02 MED ORDER — WARFARIN SODIUM 4 MG PO TABS: 4.0000 mg | ORAL_TABLET | Freq: Every day | ORAL | 0 refills | Status: DC

## 2017-06-02 MED ORDER — OXYCODONE HCL 5 MG PO TABS
10.0000 mg | ORAL_TABLET | ORAL | Status: DC | PRN
  Administered 2017-06-03 – 2017-06-05 (×4): 10 mg via ORAL
  Filled 2017-06-02 (×5): qty 2

## 2017-06-02 MED ORDER — ACETAMINOPHEN 650 MG RE SUPP
650.0000 mg | RECTAL | Status: DC | PRN
  Filled 2017-06-02: qty 1

## 2017-06-02 MED ORDER — LORATADINE 10 MG PO TABS
10.0000 mg | ORAL_TABLET | Freq: Every day | ORAL | Status: DC | PRN
  Filled 2017-06-02: qty 1

## 2017-06-02 MED ORDER — ACETAMINOPHEN 325 MG PO TABS
650.0000 mg | ORAL_TABLET | ORAL | Status: DC | PRN
  Administered 2017-06-02 – 2017-06-11 (×19): 650 mg via ORAL
  Filled 2017-06-02 (×21): qty 2

## 2017-06-02 MED ORDER — WARFARIN - PHARMACIST DOSING INPATIENT
Freq: Every day | Status: DC
  Administered 2017-06-03 – 2017-06-12 (×7)

## 2017-06-02 MED ORDER — ENOXAPARIN SODIUM 30 MG/0.3ML ~~LOC~~ SOLN
30.0000 mg | Freq: Once | SUBCUTANEOUS | Status: DC
  Administered 2017-06-02: 30 mg via SUBCUTANEOUS

## 2017-06-02 NOTE — Discharge Summary (Signed)
Physician Discharge Summary   Patient ID: LATAJAH THUMAN MRN: [id number] DOB/AGE: Nov 15, 1945 72 y.o.  Admit date: 05/27/2017 Discharge date: 06-02-2016  Primary Diagnosis:  Osteoarthritis Bilateral knee(s)   Admission Diagnoses:  Past Medical History:  Diagnosis Date  . Anxiety   . Carpal tunnel syndrome on both sides   . Chronic back pain    herniated disc,stenosis,radiculopathy,spondylolisthesis  . Chronic neck pain   . Constipation, chronic   . Degenerative spinal arthritis    cervical and lumbar  . Depression   . Eczema   . GERD (gastroesophageal reflux disease)   . Hemorrhoids   . Hiatal hernia   . History of bronchitis    "I've had it a number of times"  . IBS (irritable bowel syndrome)    lactose intolerant  . Osteoarthritis    bilateral knees and shoulders,  little fingers  . TMJ (dislocation of temporomandibular joint)   . Wears glasses    Discharge Diagnoses:   Principal Problem:   OA (osteoarthritis) of knee  Estimated body mass index is 24.63 kg/m as calculated from the following:   Height as of this encounter: _0  (1.651 m).   Weight as of this encounter: 67.1 kg (148 lb).  Procedure:  Procedure(s) (LRB): TOTAL KNEE BILATERAL (Bilateral)   Consults: [facility]  HPI: Sherry [name] is a 72 y.o. year old female with end stage OA of both knees with progressively worsening pain and dysfunction. The patient has constant pain, with activity and at rest and significant functional deficits with difficulties even with ADLs. The patient has had extensive non-op management including analgesics, injections of cortisone and viscosupplements, and home exercise program, but remains in significant pain with significant dysfunction. We discussed replacing both knees in the same setting versus one at a time including procedure, risks, potential complications, rehab course, and pros and cons associated with each and the patient elects to do both knees at the  same time. The patient presents now for bilateral Total Knee Arthroplasty.    Laboratory Data: Admission on 05/27/2017  Component Date Value Ref Range Status  . WBC 05/28/2017 12.7* 4.0 - 10.5 K/uL Final  . RBC 05/28/2017 3.33* 3.87 - 5.11 MIL/uL Final  . Hemoglobin 05/28/2017 10.7* 12.0 - 15.0 g/dL Final  . HCT 05/28/2017 31.6* 36.0 - 46.0 % Final  . MCV 05/28/2017 94.9  78.0 - 100.0 fL Final  . MCH 05/28/2017 32.1  26.0 - 34.0 pg Final  . MCHC 05/28/2017 33.9  30.0 - 36.0 g/dL Final  . RDW 05/28/2017 12.8  11.5 - 15.5 % Final  . Platelets 05/28/2017 243  150 - 400 K/uL Final   Performed at [facility], [place] [address], [place], [place] [address]  . Sodium 05/28/2017 139  135 - 145 mmol/L Final  . Potassium 05/28/2017 3.9  3.5 - 5.1 mmol/L Final  . Chloride 05/28/2017 105  101 - 111 mmol/L Final  . CO2 05/28/2017 23  22 - 32 mmol/L Final  . Glucose, Bld 05/28/2017 133* 65 - 99 mg/dL Final  . BUN 05/28/2017 10  6 - 20 mg/dL Final  . Creatinine, Ser 05/28/2017 0.57  0.44 - 1.00 mg/dL Final  . Calcium 05/28/2017 8.2* 8.9 - 10.3 mg/dL Final  . GFR calc non Af Amer 05/28/2017 >60  >60 mL/min Final  . GFR calc Af Amer 05/28/2017 >60  >60 mL/min Final   Comment: (NOTE) The eGFR has been calculated using the CKD EPI equation. This calculation  has not been validated in all clinical situations. eGFR's persistently <60 mL/min signify possible Chronic Kidney Disease.   Georgiann Hahn gap 05/28/2017 11  5 - 15 Final   Performed at [facility], [place] [address], [place], [place] [address]  . Prothrombin Time 05/28/2017 13.6  11.4 - 15.2 seconds Final  . INR 05/28/2017 1.05   Final   Performed at [facility], [place] [address] [place], [place] [address]  . WBC 05/29/2017 12.2* 4.0 - 10.5 K/uL Final  . RBC 05/29/2017 2.98* 3.87 - 5.11 MIL/uL Final  . Hemoglobin 05/29/2017 9.8* 12.0 - 15.0 g/dL Final  . HCT 05/29/2017 28.4* 36.0 - 46.0 % Final    . MCV 05/29/2017 95.3  78.0 - 100.0 fL Final  . MCH 05/29/2017 32.9  26.0 - 34.0 pg Final  . MCHC 05/29/2017 34.5  30.0 - 36.0 g/dL Final  . RDW 05/29/2017 13.1  11.5 - 15.5 % Final  . Platelets 05/29/2017 217  150 - 400 K/uL Final   Performed at [facility], [place] [address], [place], [place] [address]  . Sodium 05/29/2017 140  135 - 145 mmol/L Final  . Potassium 05/29/2017 3.8  3.5 - 5.1 mmol/L Final  . Chloride 05/29/2017 106  101 - 111 mmol/L Final  . CO2 05/29/2017 26  22 - 32 mmol/L Final  . Glucose, Bld 05/29/2017 116* 65 - 99 mg/dL Final  . BUN 05/29/2017 13  6 - 20 mg/dL Final  . Creatinine, Ser 05/29/2017 0.61  0.44 - 1.00 mg/dL Final  . Calcium 05/29/2017 8.4* 8.9 - 10.3 mg/dL Final  . GFR calc non Af Amer 05/29/2017 >60  >60 mL/min Final  . GFR calc Af Amer 05/29/2017 >60  >60 mL/min Final   Comment: (NOTE) The eGFR has been calculated using the CKD EPI equation. This calculation has not been validated in all clinical situations. eGFR's persistently <60 mL/min signify possible Chronic Kidney Disease.   Georgiann Hahn gap 05/29/2017 8  5 - 15 Final   Performed at [facility], [place] [address] [place], [place] [address]  . Prothrombin Time 05/29/2017 13.9  11.4 - 15.2 seconds Final  . INR 05/29/2017 1.08   Final   Performed at [place] [address], [place], [place] [address]  . WBC 05/30/2017 11.1* 4.0 - 10.5 K/uL Final  . RBC 05/30/2017 3.03* 3.87 - 5.11 MIL/uL Final  . Hemoglobin 05/30/2017 9.6* 12.0 - 15.0 g/dL Final  . HCT 05/30/2017 29.6* 36.0 - 46.0 % Final  . MCV 05/30/2017 97.7  78.0 - 100.0 fL Final  . MCH 05/30/2017 31.7  26.0 - 34.0 pg Final  . MCHC 05/30/2017 32.4  30.0 - 36.0 g/dL Final  . RDW 05/30/2017 13.5  11.5 - 15.5 % Final  . Platelets 05/30/2017 266  150 - 400 K/uL Final   Performed at [facility], [place] [address], [place], [place] [address]  . Sodium 05/30/2017 141  135 -  145 mmol/L Final  . Potassium 05/30/2017 4.0  3.5 - 5.1 mmol/L Final  . Chloride 05/30/2017 104  101 - 111 mmol/L Final  . CO2 05/30/2017 29  22 - 32 mmol/L Final  . Glucose, Bld 05/30/2017 107* 65 - 99 mg/dL Final  . BUN 05/30/2017 11  6 - 20 mg/dL Final  . Creatinine, Ser 05/30/2017 0.69  0.44 - 1.00 mg/dL Final  . Calcium 05/30/2017 8.6* 8.9 - 10.3 mg/dL Final  .  GFR calc non Af Amer 05/30/2017 >60  >60 mL/min Final  . GFR calc Af Amer 05/30/2017 >60  >60 mL/min Final   Comment: (NOTE) The eGFR has been calculated using the CKD EPI equation. This calculation has not been validated in all clinical situations. eGFR's persistently <60 mL/min signify possible Chronic Kidney Disease.   Georgiann Hahn gap 05/30/2017 8  5 - 15 Final   Performed at [facility], [place] [address] [place], [place] [address]  . Prothrombin Time 05/30/2017 14.0  11.4 - 15.2 seconds Final  . INR 05/30/2017 1.09   Final   Performed at [place] [address] [place], [place] [address]  . WBC 05/31/2017 8.8  4.0 - 10.5 K/uL Final  . RBC 05/31/2017 2.74* 3.87 - 5.11 MIL/uL Final  . Hemoglobin 05/31/2017 8.8* 12.0 - 15.0 g/dL Final  . HCT 05/31/2017 26.0* 36.0 - 46.0 % Final  . MCV 05/31/2017 94.9  78.0 - 100.0 fL Final  . MCH 05/31/2017 32.1  26.0 - 34.0 pg Final  . MCHC 05/31/2017 33.8  30.0 - 36.0 g/dL Final  . RDW 05/31/2017 13.3  11.5 - 15.5 % Final  . Platelets 05/31/2017 277  150 - 400 K/uL Final   Performed at [facility], [place] [address] [place], [place] [address]  . Prothrombin Time 05/31/2017 16.9* 11.4 - 15.2 seconds Final  . INR 05/31/2017 1.38   Final   Performed at [facility], [place] [address], [place], [place] [address]  . Prothrombin Time 06/01/2017 17.5* 11.4 - 15.2 seconds Final  . INR 06/01/2017 1.44   Final   Performed at [facility], [place] [address], [place], [place] [address]  . WBC 06/01/2017  7.0  4.0 - 10.5 K/uL Final  . RBC 06/01/2017 2.70* 3.87 - 5.11 MIL/uL Final  . Hemoglobin 06/01/2017 8.5* 12.0 - 15.0 g/dL Final  . HCT 06/01/2017 25.4* 36.0 - 46.0 % Final  . MCV 06/01/2017 94.1  78.0 - 100.0 fL Final  . MCH 06/01/2017 31.5  26.0 - 34.0 pg Final  . MCHC 06/01/2017 33.5  30.0 - 36.0 g/dL Final  . RDW 06/01/2017 13.2  11.5 - 15.5 % Final  . Platelets 06/01/2017 293  150 - 400 K/uL Final  . Neutrophils Relative % 06/01/2017 46  % Final  . Neutro Abs 06/01/2017 3.2  1.7 - 7.7 K/uL Final  . Lymphocytes Relative 06/01/2017 33  % Final  . Lymphs Abs 06/01/2017 2.3  0.7 - 4.0 K/uL Final  . Monocytes Relative 06/01/2017 17  % Final  . Monocytes Absolute 06/01/2017 1.2* 0.1 - 1.0 K/uL Final  . Eosinophils Relative 06/01/2017 4  % Final  . Eosinophils Absolute 06/01/2017 0.3  0.0 - 0.7 K/uL Final  . Basophils Relative 06/01/2017 0  % Final  . Basophils Absolute 06/01/2017 0.0  0.0 - 0.1 K/uL Final   Performed at [facility], [place] [address] [place], [place] [address]  . Prothrombin Time 06/02/2017 19.5* 11.4 - 15.2 seconds Final  . INR 06/02/2017 1.66   Final   Performed at [facility], [place] [address], [place], [place] [address]  Hospital Outpatient Visit on 05/22/2017  Component Date Value Ref Range Status  . aPTT 05/22/2017 34  24 - 36 seconds Final   Performed at [facility], [place] [address] [place], [place] [address]  . WBC 05/22/2017 5.8  4.0 - 10.5 K/uL Final  .  RBC 05/22/2017 4.67  3.87 - 5.11 MIL/uL Final  . Hemoglobin 05/22/2017 14.9  12.0 - 15.0 g/dL Final  . HCT 05/22/2017 43.8  36.0 - 46.0 % Final  . MCV 05/22/2017 93.8  78.0 - 100.0 fL Final  . MCH 05/22/2017 31.9  26.0 - 34.0 pg Final  . MCHC 05/22/2017 34.0  30.0 - 36.0 g/dL Final  . RDW 05/22/2017 12.8  11.5 - 15.5 % Final  . Platelets 05/22/2017 300  150 - 400 K/uL Final   Performed at [facility], [place] [address]  [place], [place] [address]  . Sodium 05/22/2017 138  135 - 145 mmol/L Final  . Potassium 05/22/2017 4.5  3.5 - 5.1 mmol/L Final  . Chloride 05/22/2017 103  101 - 111 mmol/L Final  . CO2 05/22/2017 29  22 - 32 mmol/L Final  . Glucose, Bld 05/22/2017 90  65 - 99 mg/dL Final  . BUN 05/22/2017 12  6 - 20 mg/dL Final  . Creatinine, Ser 05/22/2017 0.74  0.44 - 1.00 mg/dL Final  . Calcium 05/22/2017 9.2  8.9 - 10.3 mg/dL Final  . Total Protein 05/22/2017 6.6  6.5 - 8.1 g/dL Final  . Albumin 05/22/2017 4.2  3.5 - 5.0 g/dL Final  . AST 05/22/2017 30  15 - 41 U/L Final  . ALT 05/22/2017 23  14 - 54 U/L Final  . Alkaline Phosphatase 05/22/2017 64  38 - 126 U/L Final  . Total Bilirubin 05/22/2017 0.3  0.3 - 1.2 mg/dL Final  . GFR calc non Af Amer 05/22/2017 >60  >60 mL/min Final  . GFR calc Af Amer 05/22/2017 >60  >60 mL/min Final   Comment: (NOTE) The eGFR has been calculated using the CKD EPI equation. This calculation has not been validated in all clinical situations. eGFR's persistently <60 mL/min signify possible Chronic Kidney Disease.   Georgiann Hahn gap 05/22/2017 6  5 - 15 Final   Performed at [facility], [place] [address], [place], [place] [address]  . Prothrombin Time 05/22/2017 12.4  11.4 - 15.2 seconds Final  . INR 05/22/2017 0.93   Final   Performed at [facility], [place] [address], [place], [place] [address]  . ABO/RH(D) 05/22/2017 O NEG   Final  . Antibody Screen 05/22/2017 NEG   Final  . Sample Expiration 05/22/2017 05/30/2017   Final  . Extend sample reason 05/22/2017    Final                   Value:NO TRANSFUSIONS OR PREGNANCY IN THE PAST 3 MONTHS Performed at [facility], [place] [address] [place], [place] [address]   . MRSA, PCR 05/22/2017 NEGATIVE  NEGATIVE Final  . Staphylococcus aureus 05/22/2017 POSITIVE* NEGATIVE Final   Comment: (NOTE) The Xpert SA Assay (FDA approved for NASAL specimens in patients 72 years of age and  older), is one component of a comprehensive surveillance program. It is not intended to diagnose infection nor to guide or monitor treatment. Performed at [facility], [place] [address], [place], [place] [address]   . ABO/RH(D) 05/22/2017    Final                   Value:O NEG Performed at [facility], [place] [address] [place],  [address]      X-Rays:No results found.  EKG: Orders placed or performed during the hospital encounter of 08/07/12  . ED EKG (<88mns upon  arrival to the ED)  . ED EKG (<43mns upon arrival to the ED)  . EKG 12-Lead  . EKG 12-Lead  . EKG     [facility]: [facility] underwent the above stated procedure well without complications.  Patient tolerated the procedure well and was later transferred to the recovery room and then to the orthopaedic floor for postoperative care. Anesthesia was consulted postoperatively to place an epidural in for postoperative pain management. The patient was also given PO and IV analgesics for pain control following their surgery.  They were given 24 hours of postoperative antibiotics and started on DVT prophylaxis in the form of Lovenox and Coumadin after the epidural had been removed.   PT and OT were ordered for total joint protocol.  Discharge planning consulted to help with postop disposition and equipment needs.  Patient had a decent night on the evening of surgery and started to get up OOB with therapy on day one.  Hemovac drains were pulled without difficulty on day one.  Continued to work with therapy into day two.  Dressings were changed on day two and both incisions were healing well.  The intrathecal catheter was removed without difficulty by Anesthesia on day two.  By day three, the patient started to show progress with therapy.  They continued to receive therapy each day for continued total knee protocol.  The incisions were  healing well.  They continued to progress on day four and day five over the weekend.  She received a consult by CIR right after surgery and was initially denied coverage for CIR.  On Monday POD 6, Dr. AWynelle Linkdid a peer-to-peer review with [facility] and received approval for inpaitnet stay.  She wass seen again on Tuesday, at which time the patient was seen in rounds and was ready to go to the CIR unit.   Take Coumadin for 4 weeks and then discontinue.  The dose may need to be adjusted based upon the INR.  Please follow the INR and titrate Coumadin dose for a therapeutic range between 2.0 and 3.0 INR.  After completing the 4 weeks of Coumadin, the patient may stop the Coumadin and resume their 81 mg Aspirin daily.  Continue Lovenox injections until the INR is therapeutic at or greater than 2.0.  When INR reaches the therapeutic level of equal to or greater than 2.0, the patient may discontinue the Lovenox injections.  Diet - Regular diet Follow up - in 2 weeks following surgery with Dr. AWynelle Link Activity - WBAT to both legs Disposition - Rehab Condition Upon Discharge - Stable D/C Meds - See DC Summary DVT Prophylaxis - Lovenox and Coumadin, Current INR - 1.66 (Continue Lovenox until INR therapeutic).     Discharge Instructions    Call MD / Call 911   Complete by:  As directed    If you experience chest pain or shortness of breath, CALL 911 and be transported to the hospital emergency room.  If you develope a fever above 101 F, pus (white drainage) or increased drainage or redness at the wound, or calf pain, call your surgeon's office.   Change dressing   Complete by:  As directed    Change dressing daily with sterile 4 x 4 inch gauze dressing and apply TED hose. Do not submerge the incision under water.   Constipation Prevention   Complete by:  As directed    Drink plenty of  fluids.  Prune juice may be helpful.  You may use a stool softener, such as Colace (over the counter)  100 mg twice a day.  Use MiraLax (over the counter) for constipation as needed.   Diet general   Complete by:  As directed    Discharge instructions   Complete by:  As directed    Take Coumadin for three weeks and then discontinue.  The dose may need to be adjusted based upon the INR.  Please follow the INR and titrate Coumadin dose for a therapeutic range between 2.0 and 3.0 INR.  After completing the three weeks of Coumadin, the patient may stop the Coumadin and resume their 81 mg Aspirin daily.  Continue Lovenox injections until the INR is therapeutic at or greater than 2.0.  When INR reaches the therapeutic level of equal to or greater than 2.0, the patient may discontinue the Lovenox injections.   Pick up stool softner and laxative for home use following surgery while on pain medications. Do not submerge incision under water. Please use good hand washing techniques while changing dressing each day. May shower starting three days after surgery. Please use a clean towel to pat the incision dry following showers. Continue to use ice for pain and swelling after surgery. Do not use any lotions or creams on the incision until instructed by your surgeon.  Wear both TED hose on both legs during the day every day for three weeks, but may remove the TED hose at night at home.  Postoperative Constipation Protocol  Constipation - defined medically as fewer than three stools per week and severe constipation as less than one stool per week.  One of the most common issues patients have following surgery is constipation.  Even if you have a regular bowel pattern at home, your normal regimen is likely to be disrupted due to multiple reasons following surgery.  Combination of anesthesia, postoperative narcotics, change in appetite and fluid intake all can affect your bowels.  In order to avoid complications following surgery, here are some recommendations in order to help you during your recovery  period.  Colace (docusate) - Pick up an over-the-counter form of Colace or another stool softener and take twice a day as long as you are requiring postoperative pain medications.  Take with a full glass of water daily.  If you experience loose stools or diarrhea, hold the colace until you stool forms back up.  If your symptoms do not get better within 1 week or if they get worse, check with your doctor.  Dulcolax (bisacodyl) - Pick up over-the-counter and take as directed by the product packaging as needed to assist with the movement of your bowels.  Take with a full glass of water.  Use this product as needed if not relieved by Colace only.   MiraLax (polyethylene glycol) - Pick up over-the-counter to have on hand.  MiraLax is a solution that will increase the amount of water in your bowels to assist with bowel movements.  Take as directed and can mix with a glass of water, juice, soda, coffee, or tea.  Take if you go more than two days without a movement. Do not use MiraLax more than once per day. Call your doctor if you are still constipated or irregular after using this medication for 7 days in a row.  If you continue to have problems with postoperative constipation, please contact the office for further assistance and recommendations.  If you experience "the worst abdominal  pain ever" or develop nausea or vomiting, please contact the office immediatly for further recommendations for treatment.   Do not put a pillow under the knee. Place it under the heel.   Complete by:  As directed    Do not sit on low chairs, stoools or toilet seats, as it may be difficult to get up from low surfaces   Complete by:  As directed    Driving restrictions   Complete by:  As directed    No driving until released by the physician.   Increase activity slowly as tolerated   Complete by:  As directed    Lifting restrictions   Complete by:  As directed    No lifting until released by the physician.   Patient may  shower   Complete by:  As directed    You may shower without a dressing once there is no drainage.  Do not wash over the wound.  If drainage remains, do not shower until drainage stops.   TED hose   Complete by:  As directed    Use stockings (TED hose) for 3 weeks on both leg(s).  You may remove them at night for sleeping.   Weight bearing as tolerated   Complete by:  As directed    Laterality:  bilateral   Extremity:  Lower     Allergies as of 06/02/2017      Reactions   Adhesive [tape] Other (See Comments)   Blisters; "gets puffy and red" Liquid Bandaids      Medication List    STOP taking these medications   ergocalciferol 50000 units capsule Commonly known as:  VITAMIN D2   VITAMIN B COMPLEX PO     TAKE these medications   acetaminophen 325 MG tablet Commonly known as:  TYLENOL Take 2 tablets (650 mg total) by mouth every 4 (four) hours as needed for mild pain ((score 1 to 3) or temp > 100.5).   bisacodyl 10 MG suppository Commonly known as:  DULCOLAX Place 1 suppository (10 mg total) rectally daily as needed for moderate constipation.   diazepam 5 MG tablet Commonly known as:  VALIUM Take 1-2 tablets (5-10 mg total) by mouth every 6 (six) hours as needed for muscle spasms.   dicyclomine 10 MG capsule Commonly known as:  BENTYL Take 10 mg by mouth 3 (three) times daily as needed for spasms.   docusate sodium 100 MG capsule Commonly known as:  COLACE Take 1 capsule (100 mg total) by mouth 2 (two) times daily.   enoxaparin 30 MG/0.3ML injection Commonly known as:  LOVENOX Inject 0.3 mLs (30 mg total) into the skin every 12 (twelve) hours.   famotidine 20 MG tablet Commonly known as:  PEPCID Take 20 mg by mouth as needed for heartburn or indigestion.   fexofenadine 180 MG tablet Commonly known as:  ALLEGRA Take 180 mg by mouth as needed.   gabapentin 300 MG capsule Commonly known as:  NEURONTIN Take 1 capsule (300 mg total) by mouth 3 (three) times  daily. Gabapentin 300 mg Protocol Take a 300 mg capsule three times a day for two weeks, Then a 300 mg capsule twice a day for two weeks, Then a 300 mg capsule once a day for two weeks, then discontinue the Gabapentin.   naloxegol oxalate 25 MG Tabs tablet Commonly known as:  MOVANTIK Take 1 tablet (25 mg total) by mouth daily.   omeprazole 20 MG capsule Commonly known as:  PRILOSEC Take 20 mg  by mouth as needed.   ondansetron 4 MG tablet Commonly known as:  ZOFRAN Take 1 tablet (4 mg total) by mouth every 6 (six) hours as needed for nausea.   Oxycodone HCl 10 MG Tabs Take 1-2 tablets (10-20 mg total) by mouth every 4 (four) hours as needed for moderate pain or severe pain.   polyethylene glycol packet Commonly known as:  MIRALAX Take 17 g by mouth 3 (three) times daily. Until bowels move; maximum 2 consecutive days What changed:    when to take this  additional instructions   PROBIOTIC DAILY PO Take 1 tablet by mouth every other day.   sodium phosphate 7-19 GM/118ML Enem Place 133 mLs (1 enema total) rectally once as needed for severe constipation.   warfarin 4 MG tablet Commonly known as:  COUMADIN Take 1 tablet (4 mg total) by mouth daily at 6 PM as per pharmacy protocol. Target INR between 2.0 and 3.0.            Discharge Care Instructions  (From admission, onward)        Start     Ordered   06/02/17 0000  Weight bearing as tolerated    Question Answer Comment  Laterality bilateral   Extremity Lower      06/02/17 0840   06/02/17 0000  Change dressing    Comments:  Change dressing daily with sterile 4 x 4 inch gauze dressing and apply TED hose. Do not submerge the incision under water.   06/02/17 0840     Follow-up Information    Gaynelle Arabian, MD. Schedule an appointment as soon as possible for a visit.   Specialty:  Orthopedic Surgery Why:  Call office to set up appointment approx. two weeks following knee surgery and after discharged from rehab  unit. Contact information: [address] [place] [address] [phone]           Signed: Arlee Muslim, PA-C Orthopaedic Surgery 06/02/2017, 8:41 AM

## 2017-06-02 NOTE — Progress Notes (Signed)
06/01/17 1110 PT treatment- late entry  PT Visit Information  Last PT Received On 06/01/17  Assistance Needed +2  PT/OT/SLP Co-Evaluation/Treatment Yes  Reason for Co-Treatment For patient/therapist safety  PT goals addressed during session Mobility/safety with mobility;Balance  OT goals addressed during session Proper use of Adaptive equipment and DME;Strengthening/ROM  History of Present Illness S/P bil TKA  Precautions  Precautions Knee;Fall  Precaution Comments monitor BP, HR, did not use KI on either leg  Restrictions  RLE Weight Bearing WBAT  LLE Weight Bearing WBAT  Pain Assessment  Pain Location both knees  Pain Descriptors / Indicators Aching;Discomfort;Operative site guarding  Cognition  Arousal/Alertness Lethargic;Suspect due to medications  Behavior During Therapy [facility] for tasks assessed/performed  General Comments slow to respond verbally; answered appropriately but didn't really focus on person  Bed Mobility  Supine to sit Mod assist;HOB elevated  General bed mobility comments use of leg lifter for legs, extra time and multimodal cues  Transfers  Overall transfer level Needs assistance  Equipment used Rolling walker (2 wheeled)  Transfers Sit to/from Stand  Sit to Stand +2 physical assistance;Mod assist;From elevated surface  General transfer comment Cues for hand and foot placement , patient able to work feet back in preparation to stand. 2 assist to stand from and cues  for step by step sequence to walk legs forward  then reach back for armrests.   Ambulation/Gait  Ambulation/Gait assistance Mod assist;+2 physical assistance;+2 safety/equipment x 20'  Assistive device Rolling walker (2 wheeled)  Gait Pattern/deviations Step-to pattern;Decreased step length - right;Decreased step length - left;Decreased stride length;Antalgic;Trunk flexed  General Gait Details Cues for LE advancement, upper trunk control, posture and RW positioning and sequencing.  Close chair  follow for safety.    Gait velocity decreased  Balance  Overall balance assessment Needs assistance  Sitting-balance support Feet supported;Bilateral upper extremity supported  Sitting balance-Leahy Scale Fair  Standing balance support During functional activity;Bilateral upper extremity supported  Standing balance-Leahy Scale Poor  Total Joint Exercises  Long Arc [place];Both;10 reps;Seated  PT - End of Session  Equipment Utilized During Treatment Gait belt  Activity Tolerance Patient limited by fatigue;Patient limited by pain  Patient left in chair;with call bell/phone within reach  Nurse Communication Mobility status  PT - Assessment/Plan  PT Plan Current plan remains appropriate  PT Visit Diagnosis Unsteadiness on feet (R26.81);Pain  Pain - Right/Left Left  Pain - part of body Knee  PT Frequency (ACUTE ONLY) 7X/week  Recommendations for Other Services Rehab consult  Follow Up Recommendations CIR  PT equipment Rolling walker with 5" wheels  AM-PAC PT "6 Clicks" Daily Activity Outcome Measure  Difficulty turning over in bed (including adjusting bedclothes, sheets and blankets)? 1  Difficulty moving from lying on back to sitting on the side of the bed?  1  Difficulty sitting down on and standing up from a chair with arms (e.g., wheelchair, bedside commode, etc,.)? 1  Help needed moving to and from a bed to chair (including a wheelchair)? 1  Help needed walking in hospital room? 1  Help needed climbing 3-5 steps with a railing?  1  6 Click Score 6  Mobility G Code  CN  PT Goal Progression  Progress towards PT goals Progressing toward goals  PT Time Calculation  PT Start Time (ACUTE ONLY) 0930  PT Stop Time (ACUTE ONLY) 0958  PT Time Calculation (min) (ACUTE ONLY) 28 min  PT General Charges  $$ ACUTE PT VISIT 1 Visit  PT  Treatments  $Gait Training 8-22 mins  [place] PT [phone]

## 2017-06-02 NOTE — Progress Notes (Signed)
   Subjective: 6 Days Post-Op Procedure(s) (LRB): TOTAL KNEE BILATERAL (Bilateral) Patient reports pain as mild and moderate.   Patient seen in rounds for Dr. Wynelle Link. Patient is well, but has had some minor complaints of pain in the knees, requiring pain medications. Received Biochemist, clinical from [facility]. Patient is ready to go to CIR.  Objective: Vital signs in last 24 hours: Temp:  [98.3 F (36.8 C)-98.6 F (37 C)] 98.6 F (37 C) (02/18 2148) Pulse Rate:  [111-113] 111 (02/18 2148) Resp:  [18] 18 (02/18 2148) BP: (125-132)/(63-82) 125/63 (02/18 2148) SpO2:  [96 %-100 %] 96 % (02/18 2148)  Intake/Output from previous day:  Intake/Output Summary (Last 24 hours) at 06/02/2017 0831 Last data filed at 06/01/2017 2149 Gross per 24 hour  Intake 1320 ml  Output 2400 ml  Net -1080 ml    Intake/Output this shift: No intake/output data recorded.  Labs: Recent Labs    05/31/17 0517 06/01/17 0602  HGB 8.8* 8.5*   Recent Labs    05/31/17 0517 06/01/17 0602  WBC 8.8 7.0  RBC 2.74* 2.70*  HCT 26.0* 25.4*  PLT 277 293   No results for input(s): NA, K, CL, CO2, BUN, CREATININE, GLUCOSE, CALCIUM in the last 72 hours. Recent Labs    06/01/17 0602 06/02/17 0536  INR 1.44 1.66    EXAM: General - Patient is Alert, Appropriate and Oriented Extremity - Neurovascular intact Sensation intact distally Intact pulses distally Dorsiflexion/Plantar flexion intact Incisions - clean, dry, no drainage Motor Function - intact, moving feet and toes well on exam.   Assessment/Plan: 6 Days Post-Op Procedure(s) (LRB): TOTAL KNEE BILATERAL (Bilateral) Procedure(s) (LRB): TOTAL KNEE BILATERAL (Bilateral) Past Medical History:  Diagnosis Date  . Anxiety   . Carpal tunnel syndrome on both sides   . Chronic back pain    herniated disc,stenosis,radiculopathy,spondylolisthesis  . Chronic neck pain   . Constipation, chronic   . Degenerative spinal arthritis    cervical and lumbar  .  Depression   . Eczema   . GERD (gastroesophageal reflux disease)   . Hemorrhoids   . Hiatal hernia   . History of bronchitis    "I've had it a number of times"  . IBS (irritable bowel syndrome)    lactose intolerant  . Osteoarthritis    bilateral knees and shoulders,  little fingers  . TMJ (dislocation of temporomandibular joint)   . Wears glasses    Principal Problem:   OA (osteoarthritis) of knee  Estimated body mass index is 24.63 kg/m as calculated from the following:   Height as of this encounter: 5\' 5"  (1.651 m).   Weight as of this encounter: 67.1 kg (148 lb). Up with therapy Diet - Regular diet Follow up - in 2 weeks following surgery with Dr. Wynelle Link  Activity - WBAT to both legs Disposition - Rehab Condition Upon Discharge - Stable D/C Meds - See DC Summary DVT Prophylaxis - Lovenox and Coumadin, Current INR - 1.66 (Continue Lovenox until INR therapeutic).  Arlee Muslim, PA-C Orthopaedic Surgery 06/02/2017, 8:31 AM

## 2017-06-02 NOTE — Progress Notes (Signed)
Physical Therapy Treatment Patient Details Name: Sherry [name] MRN: [id number] DOB: [birth year] Today's Date: 06/02/2017    History of Present Illness S/P bil TKA    PT Comments    POD # 6  Assisted OOB to amb to and from bathroom.  Pt progressing slolwy and plans to D/C to CIR  Follow Up Recommendations        Equipment Recommendations       Recommendations for Other Services       Precautions / Restrictions Precautions Precautions: Knee;Fall Precaution Comments: D/C KI Restrictions Weight Bearing Restrictions: No RLE Weight Bearing: Weight bearing as tolerated LLE Weight Bearing: Weight bearing as tolerated    Mobility  Bed Mobility Overal bed mobility: Needs Assistance Bed Mobility: Supine to Sit     Supine to sit: Mod assist;HOB elevated     General bed mobility comments: use of leg lifter for legs, extra time and multimodal cues  Transfers Overall transfer level: Needs assistance Equipment used: Rolling walker (2 wheeled) Transfers: Sit to/from Stand Sit to Stand: Min assist;Mod assist         General transfer comment: 25% VC's on safety with turns  Ambulation/Gait Ambulation/Gait assistance: Min assist Ambulation Distance (Feet): 22 Feet(11 feet x 2 to and from bathroom) Assistive device: Rolling walker (2 wheeled) Gait Pattern/deviations: Step-to pattern;Decreased step length - right;Decreased step length - left;Decreased stride length;Antalgic;Trunk flexed Gait velocity: decreased   General Gait Details: Cues for LE advancement, upper trunk control, posture and RW positioning and sequencing.     Stairs            Wheelchair Mobility    Modified Rankin (Stroke Patients Only)       Balance                                            Cognition Arousal/Alertness: Awake/alert Behavior During Therapy: WFL for tasks assessed/performed Overall Cognitive Status: Within Functional Limits for tasks assessed                                  General Comments: slow      Exercises      General Comments        Pertinent Vitals/Pain Pain Assessment: 0-10 Faces Pain Scale: Hurts worst Pain Location: both knees  everytime pain level is asked, pt immediately always says 10 Pain Descriptors / Indicators: Aching;Discomfort;Operative site guarding Pain Intervention(s): Monitored during session;Repositioned;Ice applied    Home Living                      Prior Function            PT Goals (current goals can now be found in the care plan section) Progress towards PT goals: Progressing toward goals    Frequency           PT Plan      Co-evaluation              AM-PAC PT "6 Clicks" Daily Activity  Outcome Measure                   End of Session Equipment Utilized During Treatment: Gait belt Activity Tolerance: Patient tolerated treatment well Patient left: in chair;with nursing/sitter in room Nurse Communication: Mobility status  Time: 0922-0955 PT Time Calculation (min) (ACUTE ONLY): 33 min  Charges:  $Gait Training: 8-22 mins $Therapeutic Activity: 8-22 mins                    G Codes:       {Kalianna Verbeke  PTA [facility]  Acute  Rehab Pager      [phone]

## 2017-06-02 NOTE — Progress Notes (Signed)
Inpatient Rehabilitation  I have received notification from [facility] [employer] that peer to peer resulted in a denial overturn and I now have authorization for an IP Rehab admission.  I have medical clearance and a bed to offer today.  I have notified patient who is in agreement with plan to proceed with an IP Rehab admission.    Carmelia Roller., CCC/SLP Admission Coordinator  [place]  Cell [phone]

## 2017-06-02 NOTE — Plan of Care (Signed)
Plan of care reviewed and discussed with patient and husband.  Plan CIR 2/19.

## 2017-06-02 NOTE — Progress Notes (Signed)
[place] for warfarin Indication: VTE prophylaxis status post TKA   Allergies  Allergen Reactions  . Adhesive [Tape] Other (See Comments)    Blisters; "gets puffy and red" Liquid Bandaids    Patient Measurements: Height: 5\' 5"  (165.1 cm) Weight: 148 lb (67.1 kg) IBW/kg (Calculated) : 57  Vital Signs: Temp: 98.6 F (37 C) (02/18 2148) Temp Source: Oral (02/18 2148) BP: 125/63 (02/18 2148) Pulse Rate: 111 (02/18 2148)  Labs: Recent Labs    05/31/17 0517 06/01/17 0602 06/02/17 0536  HGB 8.8* 8.5*  --   HCT 26.0* 25.4*  --   PLT 277 293  --   LABPROT 16.9* 17.5* 19.5*  INR 1.38 1.44 1.66    Estimated Creatinine Clearance: 58.9 mL/min (by C-G formula based on SCr of 0.69 mg/dL).   Medical History: Past Medical History:  Diagnosis Date  . Anxiety   . Carpal tunnel syndrome on both sides   . Chronic back pain    herniated disc,stenosis,radiculopathy,spondylolisthesis  . Chronic neck pain   . Constipation, chronic   . Degenerative spinal arthritis    cervical and lumbar  . Depression   . Eczema   . GERD (gastroesophageal reflux disease)   . Hemorrhoids   . Hiatal hernia   . History of bronchitis    "I've had it a number of times"  . IBS (irritable bowel syndrome)    lactose intolerant  . Osteoarthritis    bilateral knees and shoulders,  little fingers  . TMJ (dislocation of temporomandibular joint)   . Wears glasses     Assessment: 57 YOF with history of BL osteoarthritis of the knees. Presented to [facility] on 2/13 for BL TKA. Patient not on anticoagulation prior to admission. Coumadin ordered post op for VTE prophylaxis. Ortho team plans to continue with Coumadin for 4 weeks, then transition to aspirin 81mg  daily.  - epidural removed on 2/15 - lovenox 30 mg q12h started on 2/15 PM -   Today, 06/02/2017: - INR is subtherapeutic but is trending up nicely to 1.66 today - hgb dec to 8.5, plts WNL (labs on  2/18) - no bleeding documented - no significant drug-drug intxns - regular diet  Goal of Therapy:  INR 2-3  Plan:  - repeat warfarin 4 mg PO x1, if not discharged to rehab today - daily INR - continue enoxaparin until INR is therapeutic - Monitor for signs and symptoms of bleeding  Dia Sitter, PharmD, BCPS 06/02/2017 9:47 AM

## 2017-06-02 NOTE — H&P (Signed)
Physical Medicine and Rehabilitation Admission H&P  Chief complaint: Knee pain  HPI: 72 year old right-handed female with history of chronic back and neck pain as well as osteoarthritis degenerative changes bilateral knees. Per chart review patient lives with spouse. Independent prior to admission and active. She takes care of her grandchildren. Two-level home with 2 steps to entry. Bed and bath are upstairs. Presented 05/27/2017 with progressive knee pain bilaterally. No relief with cortisone injections exercise modifications. Underwent bilateral total knee arthroplasty 05/27/2017 per Dr. Maureen Ralphs. Hospital course pain management. Patient initially had an epidural in place. Weightbearing as tolerated bilateral lower extremities. Placed on Coumadin for DVT prophylaxis. Acute blood loss anemia 8.5 monitored. Physical and occupational therapy evaluations completed with recommendations of physical medicine rehabilitation consult and patient was admitted for a comprehensive rehabilitation program.  Review of Systems  Constitutional: Negative for chills and fever.  HENT: Negative for hearing loss.  Eyes: Negative for blurred vision and double vision.  Respiratory: Negative for cough and shortness of breath.  Cardiovascular: Negative for chest pain, palpitations and leg swelling.  Gastrointestinal: Positive for constipation. Negative for nausea and vomiting.  GERD  Genitourinary: Negative for dysuria, flank pain and hematuria.  Musculoskeletal: Positive for back pain and neck pain.  Psychiatric/Behavioral: Positive for depression.  Anxiety  All other systems reviewed and are negative.       Past Medical History:  Diagnosis Date  . Anxiety   . Carpal tunnel syndrome on both sides   . Chronic back pain    herniated disc,stenosis,radiculopathy,spondylolisthesis  . Chronic neck pain   . Constipation, chronic   . Degenerative spinal arthritis    cervical and lumbar  . Depression   . Eczema   . GERD  (gastroesophageal reflux disease)   . Hemorrhoids   . Hiatal hernia   . History of bronchitis    "I've had it a number of times"  . IBS (irritable bowel syndrome)    lactose intolerant  . Osteoarthritis    bilateral knees and shoulders, little fingers  . TMJ (dislocation of temporomandibular joint)   . Wears glasses         Past Surgical History:  Procedure Laterality Date  . APPENDECTOMY  1967  . BLADDER SUSPENSION  1980's   sling  . BREAST BIOPSY Left 1980's   "twice on the left"  . BREAST REDUCTION SURGERY Bilateral 1988  . CERVIX LESION DESTRUCTION  age 46s  . DEBRIDEMENT COMPLEX MUCOID CYST, ARTHROTOMY W/ SYNOVECTOMY Left 07-21-2008 dr sypher [facility]   Left long finger  . DILATION AND CURETTAGE OF UTERUS  1988  . INCISION AND DRAINAGE ABSCESS / HEMATOMA OF BURSA / KNEE / Colon   "day after knee scope"  . KNEE ARTHROSCOPY  1994   unilateral  . POSTERIOR FUSION LUMBAR SPINE  02/2011   L3-L5  . TONSILLECTOMY  1951  . TOTAL KNEE ARTHROPLASTY Bilateral 05/27/2017   Procedure: TOTAL KNEE BILATERAL; Surgeon: Gaynelle Arabian, MD; Location: [facility]; Service: Orthopedics; Laterality: Bilateral;        Family History  Problem Relation Age of Onset  . Kidney disease Father   . Rheum arthritis Father   . Parkinson's disease Mother   . Leukemia Sister   . Colon cancer Maternal Grandfather    questionable  . Breast cancer Paternal Aunt   . Anesthesia problems Neg Hx   . Hypotension Neg Hx   . Malignant hyperthermia Neg Hx   . Pseudochol deficiency Neg Hx    Social History:  reports that she quit smoking about 49 years ago. Her smoking use included cigarettes. She quit after 4.00 years of use. she has never used smokeless tobacco. She reports that she drinks about 3.6 oz of alcohol per week. She reports that she does not use drugs.  Allergies:       Allergies  Allergen Reactions  . Adhesive [Tape] Other (See Comments)    Blisters; "gets puffy and red"  Liquid Bandaids          Medications Prior to Admission  Medication Sig Dispense Refill  . B Complex Vitamins (VITAMIN B COMPLEX PO) Take 1 tablet by mouth daily.    Marland Kitchen dicyclomine (BENTYL) 10 MG capsule Take 10 mg by mouth 3 (three) times daily as needed for spasms.    . ergocalciferol (VITAMIN D2) 50000 units capsule Take 50,000 Units by mouth every Monday.    Marland Kitchen omeprazole (PRILOSEC) 20 MG capsule Take 20 mg by mouth as needed.    . polyethylene glycol (MIRALAX) packet Take 17 g by mouth 3 (three) times daily. Until bowels move; maximum 2 consecutive days (Patient taking differently: Take 17 g by mouth every other day. Until bowels move; maximum 2 consecutive days) 6 each 0  . Probiotic Product (PROBIOTIC DAILY PO) Take 1 tablet by mouth every other day.     . famotidine (PEPCID) 20 MG tablet Take 20 mg by mouth as needed for heartburn or indigestion.    . fexofenadine (ALLEGRA) 180 MG tablet Take 180 mg by mouth as needed.      Drug Regimen Review  Drug regimen was reviewed and remains appropriate with no significant issues identified  Home:  Home Living  Family/patient expects to be discharged to:: Inpatient rehab  Living Arrangements: Spouse/significant other  Available Help at Discharge: Family  Type of Home: House  Home Access: Stairs to enter  Technical brewer of Steps: 2  Home Layout: Two level  Alternate Level Stairs-Number of Steps: 13  Alternate Level Stairs-Rails: Right  Bathroom Shower/Tub: Tourist information centre manager: Standard  Home Equipment: Environmental consultant - 2 wheels, Shower seat, Adaptive equipment, Grab bars - toilet, Grab bars - tub/shower  Additional Comments: bed and bath upstairs. Walk in shower with seat. HH shower, higher commodes  Functional History:  Prior Function  Level of Independence: Independent  Comments: cares for grandchildren. Could not carry baby upstairs  Functional Status:  Mobility:  Bed Mobility  Overal bed mobility: Needs Assistance  Bed Mobility:  Supine to Sit, Sit to Supine  Supine to sit: Min assist  Sit to supine: Min assist  General bed mobility comments: patient used leg lifter to self assist the legs, cus to use UE's once feet over bed edge.  Transfers  Overall transfer level: Needs assistance  Equipment used: Rolling walker (2 wheeled)  Transfers: Sit to/from Stand  Sit to Stand: Mod assist, +2 physical assistance, +2 safety/equipment  General transfer comment: from very high bed; assist to rise and stabilize. Cues for walking feet forward and backwards and cues for UE placement  Ambulation/Gait  Ambulation/Gait assistance: Mod assist, +2 physical assistance, +2 safety/equipment  Ambulation Distance (Feet): 4 Feet  Assistive device: Rolling walker (2 wheeled)  Gait Pattern/deviations: Step-to pattern  General Gait Details: side steps along the bed, cues for technique.   ADL:  ADL  Overall ADL's : Needs assistance/impaired  Eating/Feeding: Independent  Grooming: Set up, Sitting  Upper Body Bathing: Set up, Sitting  Lower Body Bathing: Maximal assistance, +2 for physical assistance,  Sit to/from stand  Upper Body Dressing : Minimal assistance, Sitting(lines)  Lower Body Dressing: Total assistance, +2 for physical assistance, Sit to/from stand  General ADL Comments: pt has epidural in place. Stood but orthostatic: see vitals section of chart. Educated on concept of AE. She has a sock aide at home.  Cognition:  Cognition  Overall Cognitive Status: Within Functional Limits for tasks assessed  Orientation Level: Oriented X4  Cognition  Arousal/Alertness: Awake/alert  Behavior During Therapy: WFL for tasks assessed/performed  Overall Cognitive Status: Within Functional Limits for tasks assessed  Physical Exam:  Blood pressure (!) 120/59, pulse 83, temperature 98.3 F (36.8 C), temperature source Oral, resp. rate 13, height 5\' 5"  (1.651 m), weight 67.1 kg (148 lb), last menstrual period 04/14/1997, SpO2 93 %.  Physical  Exam  Vitals reviewed.  Constitutional: She appears well-developed.  HENT:  Head: Normocephalic.  Eyes: EOM are normal. Right eye exhibits no discharge. Left eye exhibits no discharge.  Neck: Normal range of motion. Neck supple. No thyromegaly present.  Cardiovascular: Normal rate, regular rhythm and normal heart sounds.  Respiratory: Effort normal and breath sounds normal. No respiratory distress.  GI: Soft. Bowel sounds are normal. She exhibits no distension. There is no tenderness.  hy pitched bowels sounds, decr bowel sounds.  Skin. Warm and dry bilateral knee incisions with some erythema along left knee incision. no drainage noted.  Musc: both knees Appropriately tender. Right Knee with 75-80 degrees flexion PROM, left knee 60-65 flexion  Neurological. Patient is a bit lethargic but makes good eye contact with examiner. Follows full commands. DTR symmetrical. Upper extremities 5 out of 5. Bilateral lower extremities hip flexors knee extension limited by pain. Ankle dorsi plantar flexion 4+ out of 5 bilaterally.  Lab Results Last 48 Hours  Imaging Results (Last 48 hours)     Medical Problem List and Plan:  1. Decreased functional mobility secondary to end-stage osteoarthritis bilateral knees. Status post bilateral TKA 05/27/2017. Weightbearing as tolerated.  -admit to inpatient rehab  2. DVT Prophylaxis/Anticoagulation: Coumadin for DVT prophylaxis. Plan is for Coumadin 4 weeks then aspirin 81 mg daily Check vascular study  3.  Pain Management: Neurontin 300 mg 3 times a day, Valium and oxycodone as needed. Monitor narcotics for increased sedation  ` -scheduled ice to knees while awake  4. Mood: Provide emotional support  5. Neuropsych: This patient is capable of making decisions on her own behalf.  6. Skin/Wound Care: Routine skin checks  7. Fluids/Electrolytes/Nutrition: Routine I&O's with follow-up chemistries  8. Acute blood loss anemia. Follow-up CBC  9. Chronic constipation. Continue Movantik, qod miralax, dulcolax suppository tonight  -pt has hx of IBS  10.GERD.Prilosec   -prn maalox   Post Admission Physician Evaluation:  1. Functional deficits secondary to OA bilateral knees, s/p TKA's. 2. Patient is admitted to receive collaborative, interdisciplinary care between the physiatrist, rehab nursing staff, and therapy team. 3. Patient's level of medical complexity and substantial therapy needs in context of that medical necessity cannot be provided at a lesser intensity of care such as a SNF. 4. Patient has experienced substantial functional loss from his/her baseline which was documented above under the "Functional History" and "Functional Status" headings. Judging by the patient's diagnosis, physical exam, and functional history, the patient has potential for functional progress which will result in measurable gains while on inpatient rehab. These gains will be of substantial and practical use upon discharge in facilitating mobility and self-care at the household level. 5. Physiatrist will provide 24 hour management of medical needs as well as oversight of the therapy plan/treatment and provide guidance as appropriate regarding the interaction of the two. 6. The Preadmission Screening has been reviewed and patient status is unchanged unless otherwise stated above. 7. 24 hour rehab nursing will assist with bladder management, bowel management, safety, skin/wound care, disease management, medication administration,  pain management and patient education and help integrate therapy concepts, techniques,education, etc. 8. PT will assess and treat for/with: Lower extremity strength, range of motion, stamina, balance, functional mobility, safety, adaptive techniques and equipment, knee ROM, pain control, family ed. Goals are: mod I. 9. OT will assess and treat for/with: ADL's, functional mobility, safety, upper extremity strength, adaptive techniques and equipment, pain mgt, family education. Goals are: mod I. Therapy may proceed with showering this patient. 10. SLP will assess and treat for/with: n/a. Goals are: n/a. 11. Case Management and Social Worker will assess and treat for psychological issues and discharge planning. 12. Team conference will be held weekly to assess progress toward goals and to determine barriers to discharge. 13. Patient will receive at least 3 hours of therapy per day at least 5 days per week. 14. ELOS: 7-10 days  15. Prognosis: excellent   Meredith Staggers, MD, [facility]  06/02/2017  Lavon Paganini [place], PA-C  05/29/2017

## 2017-06-03 ENCOUNTER — Inpatient Hospital Stay (HOSPITAL_COMMUNITY): Payer: Medicare Other

## 2017-06-03 ENCOUNTER — Inpatient Hospital Stay (HOSPITAL_COMMUNITY): Payer: Medicare Other | Admitting: Occupational Therapy

## 2017-06-03 ENCOUNTER — Inpatient Hospital Stay (HOSPITAL_COMMUNITY): Payer: Medicare Other | Admitting: Physical Therapy

## 2017-06-03 ENCOUNTER — Encounter (HOSPITAL_COMMUNITY): Payer: Medicare Other

## 2017-06-03 DIAGNOSIS — E8809 Other disorders of plasma-protein metabolism, not elsewhere classified: Secondary | ICD-10-CM

## 2017-06-03 DIAGNOSIS — D62 Acute posthemorrhagic anemia: Secondary | ICD-10-CM

## 2017-06-03 DIAGNOSIS — R000 Tachycardia, unspecified: Secondary | ICD-10-CM

## 2017-06-03 DIAGNOSIS — E46 Unspecified protein-calorie malnutrition: Secondary | ICD-10-CM

## 2017-06-03 DIAGNOSIS — G8918 Other acute postprocedural pain: Secondary | ICD-10-CM

## 2017-06-03 DIAGNOSIS — K5903 Drug induced constipation: Secondary | ICD-10-CM

## 2017-06-03 LAB — CBC WITH DIFFERENTIAL/PLATELET
Basophils Absolute: 0 10*3/uL (ref 0.0–0.1)
Basophils Relative: 0 %
Eosinophils Absolute: 0.3 10*3/uL (ref 0.0–0.7)
Eosinophils Relative: 5 %
HCT: 23.8 % — ABNORMAL LOW (ref 36.0–46.0)
Hemoglobin: 7.8 g/dL — ABNORMAL LOW (ref 12.0–15.0)
Lymphocytes Relative: 31 %
Lymphs Abs: 1.7 10*3/uL (ref 0.7–4.0)
MCH: 31.1 pg (ref 26.0–34.0)
MCHC: 32.8 g/dL (ref 30.0–36.0)
MCV: 94.8 fL (ref 78.0–100.0)
Monocytes Absolute: 1 10*3/uL (ref 0.1–1.0)
Monocytes Relative: 18 %
Neutro Abs: 2.5 10*3/uL (ref 1.7–7.7)
Neutrophils Relative %: 46 %
Platelets: 340 10*3/uL (ref 150–400)
RBC: 2.51 MIL/uL — ABNORMAL LOW (ref 3.87–5.11)
RDW: 13.5 % (ref 11.5–15.5)
WBC: 5.5 10*3/uL (ref 4.0–10.5)

## 2017-06-03 LAB — COMPREHENSIVE METABOLIC PANEL
ALT: 27 U/L (ref 14–54)
AST: 30 U/L (ref 15–41)
Albumin: 2.3 g/dL — ABNORMAL LOW (ref 3.5–5.0)
Alkaline Phosphatase: 41 U/L (ref 38–126)
Anion gap: 10 (ref 5–15)
BUN: 11 mg/dL (ref 6–20)
CO2: 25 mmol/L (ref 22–32)
Calcium: 8.4 mg/dL — ABNORMAL LOW (ref 8.9–10.3)
Chloride: 103 mmol/L (ref 101–111)
Creatinine, Ser: 0.66 mg/dL (ref 0.44–1.00)
GFR calc Af Amer: >60 mL/min (ref >60)
GFR calc non Af Amer: >60 mL/min (ref >60)
Glucose, Bld: 105 mg/dL — ABNORMAL HIGH (ref 65–99)
Potassium: 3.6 mmol/L (ref 3.5–5.1)
Sodium: 138 mmol/L (ref 135–145)
Total Bilirubin: 0.9 mg/dL (ref 0.3–1.2)
Total Protein: 4.8 g/dL — ABNORMAL LOW (ref 6.5–8.1)

## 2017-06-03 LAB — PROTIME-INR
INR: 1.71
Prothrombin Time: 19.9 seconds — ABNORMAL HIGH (ref 11.4–15.2)

## 2017-06-03 MED ORDER — PRO-STAT SUGAR FREE PO LIQD
30.0000 mL | Freq: Two times a day (BID) | ORAL | Status: DC
  Administered 2017-06-03 – 2017-06-13 (×21): 30 mL via ORAL
  Filled 2017-06-03 (×21): qty 30

## 2017-06-03 MED ORDER — WARFARIN SODIUM 2 MG PO TABS
4.0000 mg | ORAL_TABLET | Freq: Once | ORAL | Status: AC
  Administered 2017-06-03: 4 mg via ORAL
  Filled 2017-06-03: qty 2

## 2017-06-03 NOTE — Progress Notes (Signed)
Social Work  Social Work Assessment and Plan  Patient Details  Name: Sherry [name] MRN: [id number] Date of Birth: [birth year]  Today's Date: 06/03/2017  Problem List:  Patient Active Problem List   Diagnosis Date Noted  . Status post bilateral knee replacements 06/02/2017  . OA (osteoarthritis) of knee 05/27/2017  . Flank pain 10/03/2011  . Abdominal pain 09/30/2011  . [place] DISEASE, LUMBAR SPINE 05/07/2010  . BACK PAIN, LUMBAR 05/07/2010  . Abdominal pain, unspecified site 06/08/2009  . RLQ PAIN 03/30/2009  . ABNORMAL FINDINGS GI TRACT 03/30/2009  . ANXIETY 03/22/2009  . DEPRESSION 03/22/2009  . GERD 03/22/2009  . CONSTIPATION 03/22/2009  . FLATULENCE-GAS-BLOATING 03/22/2009  . PERSONAL HX COLONIC POLYPS 03/22/2009   Past Medical History:  Past Medical History:  Diagnosis Date  . Anxiety   . Carpal tunnel syndrome on both sides   . Chronic back pain    herniated disc,stenosis,radiculopathy,spondylolisthesis  . Chronic neck pain   . Constipation, chronic   . Degenerative spinal arthritis    cervical and lumbar  . Depression   . Eczema   . GERD (gastroesophageal reflux disease)   . Hemorrhoids   . Hiatal hernia   . History of bronchitis    "I've had it a number of times"  . IBS (irritable bowel syndrome)    lactose intolerant  . Osteoarthritis    bilateral knees and shoulders,  little fingers  . TMJ (dislocation of temporomandibular joint)   . Wears glasses    Past Surgical History:  Past Surgical History:  Procedure Laterality Date  . APPENDECTOMY  1967  . BLADDER SUSPENSION  1980's   sling  . BREAST BIOPSY Left 1980's   "twice on the left"  . BREAST REDUCTION SURGERY Bilateral 1988  . CERVIX LESION DESTRUCTION  age 74s  . DEBRIDEMENT COMPLEX MUCOID CYST, ARTHROTOMY W/ SYNOVECTOMY Left 07-21-2008  dr sypher [facility]   Left long finger  . DILATION AND CURETTAGE OF UTERUS  1988  . INCISION AND DRAINAGE ABSCESS / HEMATOMA OF BURSA / KNEE / Walker   "day after knee scope"  . KNEE ARTHROSCOPY  1994   unilateral  . POSTERIOR FUSION LUMBAR SPINE  02/2011   L3-L5  . TONSILLECTOMY  1951  . TOTAL KNEE ARTHROPLASTY Bilateral 05/27/2017   Procedure: TOTAL KNEE BILATERAL;  Surgeon: Gaynelle Arabian, MD;  Location: [facility];  Service: Orthopedics;  Laterality: Bilateral;   Social History:  reports that she quit smoking about 49 years ago. Her smoking use included cigarettes. She quit after 4.00 years of use. she has never used smokeless tobacco. She reports that she drinks about 3.6 oz of alcohol per week. She reports that she does not use drugs.  Family / Support Systems Marital Status: Married Patient Roles: Spouse, Parent, Other (Comment)(grandparent) Spouse/Significant Other: Delfino Lovett [phone] Children: Kristen Haynes-daughter [phone] Other Supports: Friends and church members Anticipated Caregiver: Husband can be there and has arranged someone to be there with her when he is working Ability/Limitations of Caregiver: Husband still fills in when other dentists on vacation Caregiver Availability: 24/7 Family Dynamics: Close knit family who are involved with one another and will assist with what she needs at discharge. They have extended family and friends who are willing to assist with her needs.  Social History Preferred language: English Religion: Christian Cultural Background: No issues Education: college educated Read: Yes Write: Yes Employment Status: Retired Freight forwarder Issues: No issues Guardian/Conservator: None-according to MD pt is capable  of making her own decisions while here   Abuse/Neglect Abuse/Neglect Assessment Can Be Completed: Yes Physical Abuse: Denies Verbal Abuse: Denies Sexual Abuse: Denies Exploitation of patient/patient's resources: Denies Self-Neglect: Denies  Emotional Status Pt's affect, behavior adn adjustment status: Pt is very sleepy and husband voiced  she has already had a therapy this am. She is having difficulty with the pain meds and balance. She has always been independent and helped with the care of their grandchildren. Her knees were painful and interfering with her activities of daily life Recent Psychosocial Issues: other health issues managed by PCP Pyschiatric History: History of depression/anxiety takes medications which are helpful and will continue to take them. Should do well here and be a short length of stay Substance Abuse History: No issues  Patient / Family Perceptions, Expectations & Goals Pt/Family understanding of illness & functional limitations: Pt and husband can explain her surgery and treatment plan going forward. Husband is a Pharmacist, community and still works to fill in. They talk with the MD daily and feel they are ready for therapy Premorbid pt/family roles/activities: wife, mom, grandmother, retiree, church member, friend, etc Anticipated changes in roles/activities/participation: resume Pt/family expectations/goals: Pt states: " I want to be able to do for myself before I leave here."  Husband states: " I hope she can get up and be walking some before she leave here."  [employer]: None Premorbid Home Care/DME Agencies: Other (Comment)(has DME from knee issues ) Transportation available at discharge: Husband and friends  Discharge Planning Living Arrangements: Spouse/significant other Support Systems: Spouse/significant other, Children, Friends/neighbors Type of Residence: Private residence Insurance Resources: Multimedia programmer (specify)([employer]-Medicare) Financial Resources: Family Support, Social Security Financial Screen Referred: No Living Expenses: Own Money Management: Spouse, Patient Does the patient have any problems obtaining your medications?: No Home Management: Slef and husband-hired house cleaner Patient/Family Preliminary Plans: Return home with husband who is retired but fills  in for other dentists that are off or on vacation. He has arranged for an aide to be there when he is not for next week. Will await therapy team evaluations and work on a safe discharge plans. P tis having trouble staying awake due to the pain meds needs to come up with a happy medium. Social Work Anticipated Follow Up Needs: HH/OP  Clinical Impression Lethargic female who is trying to stay awake to answer questions but letting husband answer for her. Will have 24 hr supervision for a short time at discharge. Will await therapy team evaluations today. Should do well and be a short length of stay.  Elease Hashimoto 06/03/2017, 10:11 AM

## 2017-06-03 NOTE — Progress Notes (Signed)
[facility] PHYSICAL MEDICINE & REHABILITATION     PROGRESS NOTE  Subjective/Complaints:  Pt seen lying in bed this AM.  She states she did not sleep well overnight because she had trouble adjusting to the bed and urinating.    ROS: Denies CP, SOB, N/V/D.  Objective: Vital Signs: Blood pressure (!) 138/105, pulse (!) 107, temperature 98 F (36.7 C), temperature source Oral, resp. rate 18, height 5\' 5"  (1.651 m), weight 72 kg (158 lb 11.7 oz), last menstrual period 04/14/1997, SpO2 94 %. No results found. Recent Labs    06/01/17 0602 06/03/17 0543  WBC 7.0 5.5  HGB 8.5* 7.8*  HCT 25.4* 23.8*  PLT 293 340   Recent Labs    06/03/17 0543  NA 138  K 3.6  CL 103  GLUCOSE 105*  BUN 11  CREATININE 0.66  CALCIUM 8.4*   CBG (last 3)  No results for input(s): GLUCAP in the last 72 hours.  Wt Readings from Last 3 Encounters:  06/02/17 72 kg (158 lb 11.7 oz)  05/27/17 67.1 kg (148 lb)  05/22/17 67.1 kg (148 lb)    Physical Exam:  BP (!) 138/105 (BP Location: Left Arm)   Pulse (!) 107   Temp 98 F (36.7 C) (Oral)   Resp 18   Ht 5\' 5"  (1.651 m)   Wt 72 kg (158 lb 11.7 oz)   LMP 04/14/1997   SpO2 94%   BMI 26.41 kg/m  Constitutional: She appears well-developed. NAD. HENT: Normocephalic. Atraumatic.  Eyes: EOM are normal. No discharge.  Cardiovascular: Normal rate, regular rhythm. No JVD. Respiratory: Effort normal and breath sounds normal. GI: Bowel sounds are normal. She exhibits no distension.  Musc: B/l knee tenderness with edema Neurological.  Follows full commands.  Motor: B/l upper extremities 5/5 proximal to distal.  B/l lower extremities: hip flexors 2/5, ankle dorsi/plantar flexion 5/5 Skin. Warm and dry bilateral knee incisions with some erythema along left knee incision. No drainage noted.   Assessment/Plan: 1. 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 Physiatrist is providing close team supervision and 24 hour management of active medical problems listed below. Physiatrist and rehab team continue to assess barriers to discharge/monitor patient progress toward functional and medical goals.  Function:  Bathing Bathing position   Position: Shower  Bathing parts Body parts bathed by patient: Right arm, Left arm, Abdomen, Chest, Front perineal area, Buttocks, Right upper leg, Left upper leg Body parts bathed by helper: Back, Left lower leg, Right lower leg  Bathing assist        Upper Body Dressing/Undressing Upper body dressing   What is the patient wearing?: Button up shirt         Button up shirt - Perfomed by patient: Thread/unthread right sleeve, Thread/unthread left sleeve, Pull shirt around back, Button/unbutton shirt      Upper body assist        Lower Body Dressing/Undressing Lower body dressing   What is the patient wearing?: Pants, Non-skid slipper socks     Pants- Performed by patient: Thread/unthread right pants leg Pants- Performed by helper: Thread/unthread left pants leg, Pull pants up/down   Non-skid slipper socks- Performed by helper: Don/doff right sock, Don/doff left sock                  Lower body assist        Toileting  Toileting   Toileting steps completed by patient: Performs perineal hygiene Toileting steps completed by helper: Adjust clothing prior to toileting, Adjust clothing after toileting    Toileting assist     Transfers Chair/bed transfer   Chair/bed transfer method: Stand pivot Chair/bed transfer assist level: Moderate assist (Pt 50 - 74%/lift or lower) Chair/bed transfer assistive device: Armrests, Medical sales representative     Max distance: 30 ft Assist level: Touching or steadying assistance (Pt > 75%)   Wheelchair          Cognition Comprehension Comprehension assist level: Follows  complex conversation/direction with extra time/assistive device  Expression Expression assist level: Expresses complex ideas: With extra time/assistive device  Social Interaction Social Interaction assist level: Interacts appropriately with others - No medications needed.  Problem Solving Problem solving assist level: Solves complex 90% of the time/cues < 10% of the time  Memory Memory assist level: Complete Independence: No helper    Medical Problem List and Plan:  1. Decreased functional mobility secondary to end-stage osteoarthritis bilateral knees. Status post bilateral TKA 05/27/2017. Weightbearing as tolerated.    Begin CIR   Notes reviewed, labs reviewed 2. DVT Prophylaxis/Anticoagulation: Coumadin for DVT prophylaxis. Plan is for Coumadin 4 weeks then aspirin 81 mg daily    Vascular study pending   INR subtherapeutic on 2/20 3. Pain Management: Neurontin 300 mg 3 times a day, Valium and oxycodone as needed. Monitor narcotics for increased sedation    Scheduled ice to knees while awake  4. Mood: Provide emotional support  5. Neuropsych: This patient is capable of making decisions on her own behalf.  6. Skin/Wound Care: Routine skin checks  7. Fluids/Electrolytes/Nutrition: Routine I&O's    BMP within acceptable range on 2/20 8. Acute blood loss anemia.    Hb 7.8 on 2/20   Cont to monitor 9. Chronic constipation. Continue Movantik, qod miralax, dulcolax suppository tonight    Hx of IBS  10.GERD.Prilosec              -prn maalox 11. Tachycardia    Monitor in accordance with pain and increasing activity 12. Hypoalbuminemia   Supplement initiated on 2/20  LOS (Days) 1 A FACE TO FACE EVALUATION WAS PERFORMED  Ankit Lorie Phenix 06/03/2017 1:06 PM

## 2017-06-03 NOTE — Evaluation (Signed)
Occupational Therapy Assessment and Plan  Patient Details  Name: Sherry [name] MRN: [id number] Date of Birth: 05-Oct-1945  OT Diagnosis: acute pain, muscle weakness (generalized) and pain in joint Rehab Potential: Rehab Potential (ACUTE ONLY): Good ELOS: 10 -12 days   Today's Date: 06/03/2017 OT Individual Time: 1000-1102 OT Individual Time Calculation (min): 62 min     Problem List:  Patient Active Problem List   Diagnosis Date Noted  . Status post bilateral knee replacements 06/02/2017  . OA (osteoarthritis) of knee 05/27/2017  . Flank pain 10/03/2011  . Abdominal pain 09/30/2011  . [place] DISEASE, LUMBAR SPINE 05/07/2010  . BACK PAIN, LUMBAR 05/07/2010  . Abdominal pain, unspecified site 06/08/2009  . RLQ PAIN 03/30/2009  . ABNORMAL FINDINGS GI TRACT 03/30/2009  . ANXIETY 03/22/2009  . DEPRESSION 03/22/2009  . GERD 03/22/2009  . CONSTIPATION 03/22/2009  . FLATULENCE-GAS-BLOATING 03/22/2009  . PERSONAL HX COLONIC POLYPS 03/22/2009    Past Medical History:  Past Medical History:  Diagnosis Date  . Anxiety   . Carpal tunnel syndrome on both sides   . Chronic back pain    herniated disc,stenosis,radiculopathy,spondylolisthesis  . Chronic neck pain   . Constipation, chronic   . Degenerative spinal arthritis    cervical and lumbar  . Depression   . Eczema   . GERD (gastroesophageal reflux disease)   . Hemorrhoids   . Hiatal hernia   . History of bronchitis    "I've had it a number of times"  . IBS (irritable bowel syndrome)    lactose intolerant  . Osteoarthritis    bilateral knees and shoulders,  little fingers  . TMJ (dislocation of temporomandibular joint)   . Wears glasses    Past Surgical History:  Past Surgical History:  Procedure Laterality Date  . APPENDECTOMY  1967  . BLADDER SUSPENSION  1980's   sling  . BREAST BIOPSY Left 1980's   "twice on the left"  . BREAST REDUCTION SURGERY Bilateral 1988  . CERVIX LESION DESTRUCTION  age 50s   . DEBRIDEMENT COMPLEX MUCOID CYST, ARTHROTOMY W/ SYNOVECTOMY Left 07-21-2008  dr sypher [facility]   Left long finger  . DILATION AND CURETTAGE OF UTERUS  1988  . INCISION AND DRAINAGE ABSCESS / HEMATOMA OF BURSA / KNEE / [place]   "day after knee scope"  . KNEE ARTHROSCOPY  1994   unilateral  . POSTERIOR FUSION LUMBAR SPINE  02/2011   L3-L5  . TONSILLECTOMY  1951  . TOTAL KNEE ARTHROPLASTY Bilateral 05/27/2017   Procedure: TOTAL KNEE BILATERAL;  Surgeon: Gaynelle Arabian, MD;  Location: [facility];  Service: Orthopedics;  Laterality: Bilateral;    Assessment & Plan Clinical Impression: Patient is a 72 y.o. year old female with recent admission to the hospital on 05/27/2017 with progressive knee pain bilaterally. No relief with cortisone injections exercise modifications. Underwent bilateral total knee arthroplasty 05/27/2017 per Dr. Maureen Ralphs. Hospital course pain management. Patient initially had an epidural in place. Weightbearing as tolerated bilateral lower extremities. Placed on Coumadin for DVT prophylaxis. Acute blood loss anemia 8.5 monitored. Physical and occupational therapy evaluations completed with recommendations of physical medicine rehabilitation consult and patient was admitted for a comprehensive rehabilitation program. Patient transferred to CIR on 06/02/2017 .    Patient currently requires mod with basic self-care skills secondary to muscle weakness and decreased standing balance and decreased balance strategies.  Prior to hospitalization, patient could complete ADLs with independent .  Patient will benefit from skilled intervention to decrease level of  assist with basic self-care skills and increase independence with basic self-care skills prior to discharge home with care partner.  Anticipate patient will require 24 hour supervision and follow up home health.  OT - End of Session Activity Tolerance: Tolerates 30+ min activity with multiple rests Endurance Deficit: Yes OT  Assessment Rehab Potential (ACUTE ONLY): Good OT Patient demonstrates impairments in the following area(s): Balance OT Basic ADL's Functional Problem(s): Grooming;Bathing;Dressing;Toileting OT Advanced ADL's Functional Problem(s): Simple Meal Preparation;Light Housekeeping OT Transfers Functional Problem(s): Toilet;Tub/Shower OT Plan OT Intensity: Minimum of 1-2 x/day, 45 to 90 minutes OT Frequency: 5 out of 7 days OT Duration/Estimated Length of Stay: 10 -12 days OT Treatment/Interventions: Medical illustrator training;Community reintegration;Discharge planning;DME/adaptive equipment instruction;Functional mobility training;Pain management;Self Care/advanced ADL retraining;Therapeutic Activities;UE/LE Coordination activities;Patient/family education;Therapeutic Exercise;UE/LE Strength taining/ROM;Wheelchair propulsion/positioning OT Self Feeding Anticipated Outcome(s): independent OT Basic Self-Care Anticipated Outcome(s): supervision OT Toileting Anticipated Outcome(s): supervision OT Bathroom Transfers Anticipated Outcome(s): supervision OT Recommendation Patient destination: Home Follow Up Recommendations: Home health OT Equipment Recommended: 3 in 1 bedside comode   Skilled Therapeutic Intervention Began selfcare retraining sit to stand during session.  Pt needing mod assist for sit to stand from the elevated bedside chair, the 3:1, and the tub bench.  She was able to reach down to her feet for attempted donning of LB clothing, but still needed min assist to thread pants with max assist for gripper socks.  Peri washing as well as toilet hygiene completed in sitting with lateral leans during this session with supervision.  Max assist for donning bilateral KIs for functional mobility.  Pt left in bedside chair with nursing present at end of session.  Call button and phone in reach.    OT Evaluation Precautions/Restrictions  Precautions Precautions: Knee;Fall Required Braces or Orthoses:  Knee Immobilizer - Left;Knee Immobilizer - Right Knee Immobilizer - Right: Discontinue once straight leg raise with < 10 degree lag Knee Immobilizer - Left: Discontinue once straight leg raise with < 10 degree lag Restrictions Weight Bearing Restrictions: No RLE Weight Bearing: Weight bearing as tolerated LLE Weight Bearing: Weight bearing as tolerated  Pain Pain Assessment Pain Assessment: 0-10 Pain Score: 7  Pain Type: Surgical pain Pain Location: Knee Pain Orientation: Right;Left Pain Descriptors / Indicators: Aching Pain Onset: With Activity Patients Stated Pain Goal: 3 Pain Intervention(s): Medication (See eMAR) Home Living/[place] expects to be discharged to:: Private residence Living Arrangements: Spouse/significant other Available Help at Discharge: Family, Available 24 hours/day Type of Home: House Home Access: Stairs to enter [employer] of Steps: 2 Entrance Stairs-Rails: None Home Layout: Two level Alternate Level Stairs-Number of Steps: 13 Alternate Level Stairs-Rails: Right Bathroom Shower/Tub: Walk-in shower(On the second level) Bathroom Toilet: Standard Additional Comments: bed and bath upstairs. Walk in shower with seat. HH shower, higher commodes  Lives With: Spouse IADL History Homemaking Responsibilities: Yes Current License: Yes Occupation: Retired Prior Function Level of Independence: Independent with basic ADLs, Independent with gait  Able to Take Stairs?: Yes Driving: Yes Comments: helps care for grandchildren ADL  See Function Section of chart for details  Vision Baseline Vision/History: No visual deficits;Wears glasses Wears Glasses: At all times Patient Visual Report: No change from baseline Vision Assessment?: No apparent visual deficits Perception  Perception: Within Functional Limits Praxis Praxis: Intact Cognition Overall Cognitive Status: Within Functional Limits for tasks  assessed(Pt slightly slower than normal, likely due to medications) Arousal/Alertness: Awake/alert Orientation Level: Person;Place;Situation Person: Oriented Place: Oriented Situation: Oriented Year: 2019 Month: February Day of Week: Correct  Memory: Appears intact Immediate Memory Recall: Sock;Blue;Bed Memory Recall: Sock;Blue;Bed Memory Recall Sock: Without Cue Memory Recall Blue: Without Cue Memory Recall Bed: Without Cue Attention: Sustained Sustained Attention: Appears intact Awareness: Appears intact Problem Solving: Appears intact Safety/Judgment: Appears intact Sensation Sensation Light Touch: Appears Intact Stereognosis: Not tested Hot/Cold: Not tested Proprioception: Not tested Additional Comments: Sensation intact in BUEs with gross testing and use during ADLs. Coordination Gross Motor Movements are Fluid and Coordinated: Yes Fine Motor Movements are Fluid and Coordinated: Yes Coordination and Movement Description: Coordination WFLS for BUEs during ADL tasks and functional transfers. Motor  Motor Motor - Skilled Clinical Observations: generalized weakness, limited by pain and ROM Mobility  Transfers Transfers: Sit to Stand;Stand to Sit Sit to Stand: With armrests;With upper extremity assist;From chair/3-in-1;3: Mod assist Stand to Sit: 3: Mod assist;With upper extremity assist;To chair/3-in-1;With armrests  Trunk/Postural Assessment  Cervical Assessment Cervical Assessment: Within Functional Limits Thoracic Assessment Thoracic Assessment: Within Functional Limits Lumbar Assessment Lumbar Assessment: Within Functional Limits Postural Control Postural Control: Deficits on evaluation(She exhibits flexed posture in standing)  Balance Balance Balance Assessed: Yes Static Sitting Balance Static Sitting - Level of Assistance: 5: Stand by assistance Dynamic Sitting Balance Dynamic Sitting - Balance Support: During functional activity Dynamic Sitting - Level of  Assistance: 5: Stand by assistance Static Standing Balance Static Standing - Balance Support: During functional activity;No upper extremity supported Static Standing - Level of Assistance: 4: Min assist;3: Mod assist Dynamic Standing Balance Dynamic Standing - Balance Support: During functional activity;No upper extremity supported Dynamic Standing - Level of Assistance: 4: Min assist;3: Mod assist Extremity/Trunk Assessment RUE Assessment RUE Assessment: Within Functional Limits LUE Assessment LUE Assessment: Within Functional Limits   See Function Navigator for Current Functional Status.   Refer to Care Plan for Long Term Goals  Recommendations for other services: None    Discharge Criteria: Patient will be discharged from OT if patient refuses treatment 3 consecutive times without medical reason, if treatment goals not met, if there is a change in medical status, if patient makes no progress towards goals or if patient is discharged from hospital.  The above assessment, treatment plan, treatment alternatives and goals were discussed and mutually agreed upon: by patient  , OTR/L 06/03/2017, 12:53 PM

## 2017-06-03 NOTE — Evaluation (Signed)
Physical Therapy Assessment and Plan  Patient Details  Name: Sherry [name] MRN: [id number] Date of Birth: January 27, 1946  PT Diagnosis:  limited knee ROM, Difficulty walking, Edema, Muscle weakness and Pain in joint Rehab Potential: Good ELOS: 10-12 days   Today's Date: 06/03/2017 PT Individual Time: 0802-0900 PT Individual Time Calculation (min): 58 min    Problem List:  Patient Active Problem List   Diagnosis Date Noted  . Status post bilateral knee replacements 06/02/2017  . OA (osteoarthritis) of knee 05/27/2017  . Flank pain 10/03/2011  . Abdominal pain 09/30/2011  . [place] DISEASE, LUMBAR SPINE 05/07/2010  . BACK PAIN, LUMBAR 05/07/2010  . Abdominal pain, unspecified site 06/08/2009  . RLQ PAIN 03/30/2009  . ABNORMAL FINDINGS GI TRACT 03/30/2009  . ANXIETY 03/22/2009  . DEPRESSION 03/22/2009  . GERD 03/22/2009  . CONSTIPATION 03/22/2009  . FLATULENCE-GAS-BLOATING 03/22/2009  . PERSONAL HX COLONIC POLYPS 03/22/2009    Past Medical History:  Past Medical History:  Diagnosis Date  . Anxiety   . Carpal tunnel syndrome on both sides   . Chronic back pain    herniated disc,stenosis,radiculopathy,spondylolisthesis  . Chronic neck pain   . Constipation, chronic   . Degenerative spinal arthritis    cervical and lumbar  . Depression   . Eczema   . GERD (gastroesophageal reflux disease)   . Hemorrhoids   . Hiatal hernia   . History of bronchitis    "I've had it a number of times"  . IBS (irritable bowel syndrome)    lactose intolerant  . Osteoarthritis    bilateral knees and shoulders,  little fingers  . TMJ (dislocation of temporomandibular joint)   . Wears glasses    Past Surgical History:  Past Surgical History:  Procedure Laterality Date  . APPENDECTOMY  1967  . BLADDER SUSPENSION  1980's   sling  . BREAST BIOPSY Left 1980's   "twice on the left"  . BREAST REDUCTION SURGERY Bilateral 1988  . CERVIX LESION DESTRUCTION  age 38s  . DEBRIDEMENT  COMPLEX MUCOID CYST, ARTHROTOMY W/ SYNOVECTOMY Left 07-21-2008  dr sypher [facility]   Left long finger  . DILATION AND CURETTAGE OF UTERUS  1988  . INCISION AND DRAINAGE ABSCESS / HEMATOMA OF BURSA / KNEE / [place]   "day after knee scope"  . KNEE ARTHROSCOPY  1994   unilateral  . POSTERIOR FUSION LUMBAR SPINE  02/2011   L3-L5  . TONSILLECTOMY  1951  . TOTAL KNEE ARTHROPLASTY Bilateral 05/27/2017   Procedure: TOTAL KNEE BILATERAL;  Surgeon: Gaynelle Arabian, MD;  Location: [facility];  Service: Orthopedics;  Laterality: Bilateral;    Assessment & Plan Clinical Impression: Patient is a 72 y.o. year old female with history of chronic back and neck pain as well as osteoarthritis degenerative changes bilateral knees. Per chart review patient lives with spouse. Independent prior to admission and active. She takes care of her grandchildren. Two-level home with 2 steps to entry. Bed and bath are upstairs. Presented 05/27/2017 with progressive knee pain bilaterally. No relief with cortisone injections exercise modifications. Underwent bilateral total knee arthroplasty 05/27/2017 per Dr. Maureen Ralphs. Hospital course pain management. Patient initially had an epidural in place. Weightbearing as tolerated bilateral lower extremities. Placed on Coumadin for DVT prophylaxis. Acute blood loss anemia 8.5 monitored. Physical and occupational therapy evaluations completed with recommendations of physical medicine rehabilitation consult and patient was admitted for a comprehensive rehabilitation program.  Patient transferred to CIR on 06/02/2017 .   Patient currently requires  mod with mobility secondary to muscle weakness, decreased cardiorespiratoy endurance and decreased oxygen support and decreased standing balance, decreased balance strategies and difficulty maintaining precautions.  Prior to hospitalization, patient was independent  with mobility and lived with Spouse in a House home.  Home access is 2Stairs to  enter.  Patient will benefit from skilled PT intervention to maximize safe functional mobility, minimize fall risk and decrease caregiver burden for planned discharge home with intermittent assist.  Anticipate patient will benefit from follow up [facility] at discharge.  PT - End of Session Activity Tolerance: Tolerates 30+ min activity with multiple rests Endurance Deficit: Yes PT Assessment Rehab Potential (ACUTE/IP ONLY): Good PT Barriers to Discharge: Inaccessible home environment;Home environment access/layout PT Barriers to Discharge Comments: 2 STE no rails, 2 story home without bed/shower on first floor PT Patient demonstrates impairments in the following area(s): Balance;Sensory;Skin Integrity;Edema;Endurance;Motor;Pain;Safety PT Transfers Functional Problem(s): Bed Mobility;Bed to Chair;Car;Furniture;Floor PT Locomotion Functional Problem(s): Ambulation;Wheelchair Mobility;Stairs PT Plan PT Intensity: Minimum of 1-2 x/day ,45 to 90 minutes PT Frequency: 5 out of 7 days PT Duration Estimated Length of Stay: 10-12 days PT Treatment/Interventions: Ambulation/gait training;Discharge planning;Functional mobility training;Therapeutic Activities;Neuromuscular re-education;Balance/vestibular training;Therapeutic Exercise;Skin care/wound management;Wheelchair propulsion/positioning;DME/adaptive equipment instruction;Pain management;Splinting/orthotics;UE/LE Strength taining/ROM;Community reintegration;Functional electrical stimulation;Patient/family education;Stair training;UE/LE Coordination activities PT Transfers Anticipated Outcome(s): supervision  PT Locomotion Anticipated Outcome(s): supervision with RW PT Recommendation Follow Up Recommendations: Home health PT Patient destination: Home Equipment Recommended: To be determined  Skilled Therapeutic Intervention Evaluation completed (see details above and below) with education on PT POC and goals and individual treatment initiated with focus  on functional transfers, ROM, and ambulation. Pt supine in bed upon PT arrival, agreeable to therapy tx and reports pain 3/10 pain at rest. Examination completed as detailed below. Therapist looped LEs through pants and pt performed rolling in each direction with supervision in order to don pants. Pt transferred from supine>sitting EOB with min assist and verbal cues for techniques. Pt unable to perform SLR bilaterally, knee immobilizers donned. Pt performed sit<>stand from elevated bed with mod assist and verbal cues for techniques. Pt ambulated x 20 ft and x 30 ft with RW and min assist. Pt performed sit<>stand from recliner with cushion added for extra height, mod assist. Pt transferred from recliner to commode over toilet with min assist using RW, 10 ft ambulation. Pt left seated on commode with needs in reach, husband present and hand off to nurse tech.    PT Evaluation Precautions/Restrictions Restrictions Weight Bearing Restrictions: No RLE Weight Bearing: Weight bearing as tolerated LLE Weight Bearing: Weight bearing as tolerated General   Vital Signs Pain Pain Assessment Pain Assessment: 0-10 Pain Score: 7  Pain Type: Surgical pain Pain Location: Knee Pain Descriptors / Indicators: Aching Pain Onset: With Activity Patients Stated Pain Goal: 3 Pain Intervention(s): Medication (See eMAR) Home Living/Prior Functioning Home Living Living Arrangements: Spouse/significant other Available Help at Discharge: Family;Available 24 hours/day(husband works mon and tues mornings, has hired someone to come in on those days) Type of Home: [employer] Home Access: Stairs to enter Technical brewer of Steps: 2 Entrance Stairs-Rails: None Home Layout: Two level Alternate Level Stairs-Number of Steps: 13 Alternate Level Stairs-Rails: Right  Lives With: Spouse Prior Function Level of Independence: Independent with basic ADLs;Independent with gait  Able to Take Stairs?: Yes Driving:  Yes Comments: helps care for grandchildren Cognition Overall Cognitive Status: Within Functional Limits for tasks assessed Arousal/Alertness: Awake/alert Orientation Level: Oriented X4 Sensation Sensation Light Touch: Appears Intact Proprioception: Appears Intact Additional Comments: some decreased  sensation near incisions Coordination Gross Motor Movements are Fluid and Coordinated: No Fine Motor Movements are Fluid and Coordinated: No Coordination and Movement Description: bilateral knee replacement limiting coordination secondary to limited ROM and pain Motor  Motor Motor - Skilled Clinical Observations: generalized weakness, limited by pain and ROM  Trunk/Postural Assessment  Cervical Assessment Cervical Assessment: Within Functional Limits Thoracic Assessment Thoracic Assessment: Within Functional Limits Lumbar Assessment Lumbar Assessment: Within Functional Limits Postural Control Postural Control: Within Functional Limits  Balance Balance Balance Assessed: Yes Static Sitting Balance Static Sitting - Level of Assistance: 5: Stand by assistance Dynamic Sitting Balance Dynamic Sitting - Level of Assistance: 5: Stand by assistance Static Standing Balance Static Standing - Level of Assistance: 4: Min assist Dynamic Standing Balance Dynamic Standing - Level of Assistance: 4: Min assist Extremity Assessment  RLE Assessment RLE Assessment: Exceptions to WFL(strength grossly 3- to 4/5 throughout, limited knee ROM to 30 deg, lacking 5 deg of extension) LLE Assessment LLE Assessment: Exceptions to WFL(strength grossly 3- to 4/5 throughout, limited knee ROM to 40 deg, lacking 3 deg of extension)   See Function Navigator for Current Functional Status.   Refer to Care Plan for Long Term Goals  Recommendations for other services: None   Discharge Criteria: Patient will be discharged from PT if patient refuses treatment 3 consecutive times without medical reason, if  treatment goals not met, if there is a change in medical status, if patient makes no progress towards goals or if patient is discharged from hospital.  The above assessment, treatment plan, treatment alternatives and goals were discussed and mutually agreed upon: by patient  Netta Corrigan, PT, DPT 06/03/2017, 11:02 AM

## 2017-06-03 NOTE — Care Management Note (Signed)
Inpatient [place] Individual Statement of Services  Patient Name:  Sherry [name]  Date:  06/03/2017  Welcome to the [facility].  Our goal is to provide you with an individualized program based on your diagnosis and situation, designed to meet your specific needs.  With this comprehensive rehabilitation program, you will be expected to participate in at least 3 hours of rehabilitation therapies Monday-Friday, with modified therapy programming on the weekends.  Your rehabilitation program will include the following services:  Physical Therapy (PT), Occupational Therapy (OT), 24 hour per day rehabilitation nursing, Case Management (Social Worker), Rehabilitation Medicine, Nutrition Services and Pharmacy Services  Weekly team conferences will be held on Wednersday to discuss your progress.  Your Social Worker will talk with you frequently to get your input and to update you on team discussions.  Team conferences with you and your family in attendance may also be held.  Expected length of stay: 10-12 days  Overall anticipated outcome: supervision level  Depending on your progress and recovery, your program may change. Your Social Worker will coordinate services and will keep you informed of any changes. Your Social Worker's name and contact numbers are listed  below.  The following services may also be recommended but are not provided by the [place] will be made to provide these services after discharge if needed.  Arrangements include referral to agencies that provide these services.  Your insurance has been verified to be:  [employer]-Medicare Your primary doctor is:  Josetta Huddle  Pertinent information will be shared with your doctor and your insurance company.  Social Worker:  Ovidio Kin, [place] or ([phone]  Information discussed with and copy given to patient by: Elease Hashimoto, 06/03/2017, 10:14 AM

## 2017-06-03 NOTE — Patient Care Conference (Signed)
Inpatient RehabilitationTeam Conference and Plan of Care Update Date: 06/03/2017   Time: 2:30 pm    Patient Name: SHAUNTELLE JAMERSON      Medical Record Number: [id number]  Date of Birth: May 01, 1945 Sex: Female         Room/Bed: 4M11C/4M11C-01 Payor Info: Payor: [employer] park manager MEDICARE / Plan: [employer]HC MEDICARE / Product Type: *No Product type* /    Admitting Diagnosis: b tka  Admit Date/Time:  06/02/2017 11:45 AM Admission Comments: No comment available   Primary Diagnosis:  <principal problem not specified> Principal Problem: <principal problem not specified>  Patient Active Problem List   Diagnosis Date Noted  . Reactive depression   . Post-operative pain   . Tachycardia   . Hypoalbuminemia due to protein-calorie malnutrition ([place])   . Constipation due to pain medication   . Acute blood loss anemia   . Status post bilateral knee replacements 06/02/2017  . OA (osteoarthritis) of knee 05/27/2017  . Flank pain 10/03/2011  . Abdominal pain 09/30/2011  . [place] DISEASE, LUMBAR SPINE 05/07/2010  . BACK PAIN, LUMBAR 05/07/2010  . Abdominal pain, unspecified site 06/08/2009  . RLQ PAIN 03/30/2009  . ABNORMAL FINDINGS GI TRACT 03/30/2009  . ANXIETY 03/22/2009  . DEPRESSION 03/22/2009  . GERD 03/22/2009  . CONSTIPATION 03/22/2009  . FLATULENCE-GAS-BLOATING 03/22/2009  . PERSONAL HX COLONIC POLYPS 03/22/2009    Expected Discharge Date: Expected Discharge Date: 06/13/17  Team Members Present: Physician leading conference: Dr. Delice Lesch Social Worker Present: Ovidio Kin, LCSW Nurse Present: Rayetta Humphrey, Bertram Savin, RN PT Present: Michaelene Song, PT OT Present: Clyda Greener, OT SLP Present: Windell Moulding, SLP PPS Coordinator present : Daiva Nakayama, RN, CRRN     Current Status/Progress Goal Weekly Team Focus  Medical   Decreased functional mobility secondary to end-stage osteoarthritis bilateral knees. Status post bilateral TKA 05/27/2017.  Improve mobility,  safety, transfers, pain, BP, ABLA, constipation, tachycardia  See above   Bowel/Bladder   Continent of bowel and bladder  Remain continent of bowel and bladder during admission  Transfer to toliet and amubulation to the bathroom   Swallow/Nutrition/ Hydration             ADL's   Pt currently completes UB selfcare with supervision as well as LB selfcare sit to stand with mod assist.  Transfers completed at mod assist level as well with use of the RW.   supervision  selfcare retraining, balance retraining, transfer retraining, DME/AE education   Mobility   min-mod for bed mobility with LE management, mod assist sit<>stand, min assist gait up to 30 ft, stairs unable due to immobilizers  supervision, min assist for stairs   gait, knee ROM/strength, stairs, transfers   Communication             Safety/Cognition/ Behavioral Observations  Free from falls  Adhere to the safety plan and patient to be fall and injury free   safe ambulation and transfer   Pain   Pain 3-9  patient to be pain free or pain less than 3   meds before therapy and ice for pain management   Skin   Skin intact. Incision clean, dry and intact with no infection  Remain free from skin breakdown and incision clean, dry and intact  Assess skin daily      *See Care Plan and progress notes for long and short-term goals.     Barriers to Discharge  Current Status/Progress Possible Resolutions Date Resolved   Physician  Medical stability     See above  Therapies, optimize pain meds, follow labs, optimize bowel reg, follow vitals      Nursing                  PT  Inaccessible home environment;Home environment access/layout  2 STE no rails, 2 story home without bed/shower on first floor              OT                  SLP                SW                Discharge Planning/Teaching Needs:  HOme with husband and family members who can provide 24 hr for a short time      Team Discussion:  Goals mod/i-supervision  level. New evaluation today. MD working on pain issues and pain management without snowing her. No rails on the steps will need to show husband or daughter how to get up stairs. Constipated given edema per RN  Revisions to Treatment Plan:  DC 3/2    Continued Need for Acute Rehabilitation Level of Care: The patient requires daily medical management by a physician with specialized training in physical medicine and rehabilitation for the following conditions: Daily direction of a multidisciplinary physical rehabilitation program to ensure safe treatment while eliciting the highest outcome that is of practical value to the patient.: Yes Daily medical management of patient stability for increased activity during participation in an intensive rehabilitation regime.: Yes Daily analysis of laboratory values and/or radiology reports with any subsequent need for medication adjustment of medical intervention for : Post surgical problems;Wound care problems  Elease Hashimoto 06/04/2017, 10:58 AM

## 2017-06-03 NOTE — Progress Notes (Signed)
Physical Therapy Session Note  Patient Details  Name: Sherry [name] MRN: [id number] Date of Birth: 1946/03/16  Today's Date: 06/03/2017 PT Individual Time: 0240-9735 PT Individual Time Calculation (min): 70 min   Short Term Goals: Week 1:  PT Short Term Goal 1 (Week 1): Pt will ambulate x 100 ft with RW and min assist PT Short Term Goal 2 (Week 1): Pt will perform sit<>stand with min assist PT Short Term Goal 3 (Week 1): Pt will perform x 10 straight leg raises bilaterally  Skilled Therapeutic Interventions/Progress Updates: Pt presented in recliner asleep easily aroused but difficultly staying awake throughout session. Pt performed sit to stand from recliner with modA and cues for hand placement. Performed stand pivot transfer minA and transferred to w/c. Pt instructed in w/c propulsion and propelled 157ft with min guard, min verbal cues for correction and increased time. Pt performed car transfer with minA for LE placement and RW. Pt noted to have increased difficulty sit to stand in w/c, transported to rehab gym and pt transferred to mat, modA sit to stand and minA sit to supine in mat with assist for LE placement. PTA adjusted w/c height to facilitate transfers as pt unable to perform SLR without assist and requires KI. Pt performed qs x 10, GS x 10, AA SAQ, AA SLR x 10, AA heel slides bilaterally. Pt noted to fall asleep periodically throughout session, discussed with nsg who advised due to pain meds and will be adjusting to further participate in therapy. Pt returned to w/c stand pivot from elevate mat and PTA transported back to room. Pt requesting to use toilet, pt performed sit to stand from w/c with improved technique and decreased time and performed ambulatory transfer to toilet. Pt left at toilet with nsg/NT aware and verbal understanding to use call light once completed.      Therapy Documentation Precautions:  Precautions Precautions: Knee, Fall Required Braces or Orthoses: Knee  Immobilizer - Left, Knee Immobilizer - Right Knee Immobilizer - Right: Discontinue once straight leg raise with < 10 degree lag Knee Immobilizer - Left: Discontinue once straight leg raise with < 10 degree lag Restrictions Weight Bearing Restrictions: No RLE Weight Bearing: Weight bearing as tolerated LLE Weight Bearing: Weight bearing as tolerated    Other Treatments:     See Function Navigator for Current Functional Status.   Therapy/Group: Individual Therapy  Mika Anastasi  Adarius Tigges, PTA  06/03/2017, 3:39 PM

## 2017-06-03 NOTE — IPOC Note (Signed)
Overall Plan of Care [facility]) Patient Details Name: Sherry [name] MRN: [id number] DOB: October 09, 1945  Admitting Diagnosis: B/l knee OA s/p b/l TKR  Hospital Problems: Active Problems:   Status post bilateral knee replacements   Post-operative pain   Tachycardia   Hypoalbuminemia due to protein-calorie malnutrition ([facility])   Constipation due to pain medication   Acute blood loss anemia     Functional Problem List: Nursing Endurance, Pain, Safety, Skin Integrity  PT Balance, Sensory, Skin Integrity, Edema, Endurance, Motor, Pain, Safety  OT Balance  SLP    TR         Basic ADL's: OT Grooming, Bathing, Dressing, Toileting     Advanced  ADL's: OT Simple Meal Preparation, Light Housekeeping     Transfers: PT Bed Mobility, Bed to Chair, Car, [employer], Futures trader, Metallurgist: PT Ambulation, Emergency planning/management officer, Stairs     Additional Impairments: OT    SLP        TR      Anticipated Outcomes Item Anticipated Outcome  Self Feeding independent  Swallowing      Basic self-care  supervision  Toileting  supervision   Bathroom Transfers supervision  Bowel/Bladder  Patient will remain continient of bowel and bladder during admission  Transfers  supervision   Locomotion  supervision with RW  Communication     Cognition     Pain  Patient will be free from pain or pain less than 3 during admission  Safety/Judgment  Patient will be free from falls and adhere to safety plan   Therapy Plan: PT Intensity: Minimum of 1-2 x/day ,45 to 90 minutes PT Frequency: 5 out of 7 days PT Duration Estimated Length of Stay: 10-12 days OT Intensity: Minimum of 1-2 x/day, 45 to 90 minutes OT Frequency: 5 out of 7 days OT Duration/Estimated Length of Stay: 10 -12 days      Team Interventions: Nursing Interventions Patient/Family Education, Pain Management, Skin Care/Wound Management  PT interventions Ambulation/gait training, Discharge planning, Functional  mobility training, Therapeutic Activities, Neuromuscular re-education, Balance/vestibular training, Therapeutic Exercise, Skin care/wound management, Wheelchair propulsion/positioning, DME/adaptive equipment instruction, Pain management, Splinting/orthotics, UE/LE Strength taining/ROM, Community reintegration, Technical sales engineer stimulation, Barrister's clerk education, IT trainer, UE/LE Coordination activities  OT Interventions Training and development officer, Academic librarian, Discharge planning, DME/adaptive equipment instruction, Functional mobility training, Pain management, Self Care/advanced ADL retraining, Therapeutic Activities, UE/LE Coordination activities, Patient/family education, Therapeutic Exercise, UE/LE Strength taining/ROM, Wheelchair propulsion/positioning  SLP Interventions    TR Interventions    SW/CM Interventions Discharge Planning, Psychosocial Support, Patient/Family Education   Barriers to Discharge MD  Medical stability  Nursing      PT Inaccessible home environment, Home environment access/layout 2 STE no rails, 2 story home without bed/shower on first floor  OT      SLP      SW       Team Discharge Planning: Destination: PT-Home ,OT- Home , SLP-  Projected Follow-up: PT-Home health PT, OT-  Home health OT, SLP-  Projected Equipment Needs: PT-To be determined, OT- 3 in 1 bedside comode, SLP-  Equipment Details: PT- , OT-  Patient/family involved in discharge planning: PT- Patient, Family member/caregiver,  OT-Patient, SLP-   MD ELOS: 7-10 days. Medical Rehab Prognosis:  Excellent Assessment: 72 year old right-handed female with history of chronic back and neck pain as well as osteoarthritis degenerative changes bilateral knees. Presented 05/27/2017 with progressive knee pain bilaterally. No relief with cortisone injections exercise modifications. Underwent bilateral total knee arthroplasty 05/27/2017 per  Dr. Maureen Ralphs. Hospital course pain management.  Patient initially had an epidural in place. Weightbearing as tolerated bilateral lower extremities. Placed on Coumadin for DVT prophylaxis. Acute blood loss anemia monitored. Patient with resulting functional deficits with mobility, transfers, and self-care.  Will set goals for Supervision with PT/OT.   See Team Conference Notes for weekly updates to the plan of care

## 2017-06-03 NOTE — Progress Notes (Signed)
[place] for warfarin Indication: VTE prophylaxis status post TKA   Allergies  Allergen Reactions  . Adhesive [Tape] Other (See Comments)    Blisters; "gets puffy and red" Liquid Bandaids    Patient Measurements: Height: 5\' 5"  (165.1 cm) Weight: 158 lb 11.7 oz (72 kg) IBW/kg (Calculated) : 57  Vital Signs: Temp: 98 F (36.7 C) (02/20 0358) Temp Source: Oral (02/20 0358) BP: 138/105 (02/20 0358) Pulse Rate: 107 (02/20 0358)  Labs: Recent Labs    06/01/17 0602 06/02/17 0536 06/03/17 0543  HGB 8.5*  --  7.8*  HCT 25.4*  --  23.8*  PLT 293  --  340  LABPROT 17.5* 19.5* 19.9*  INR 1.44 1.66 1.71  CREATININE  --   --  0.66    Estimated Creatinine Clearance: 65.1 mL/min (by C-G formula based on SCr of 0.66 mg/dL).   Medical History: Past Medical History:  Diagnosis Date  . Anxiety   . Carpal tunnel syndrome on both sides   . Chronic back pain    herniated disc,stenosis,radiculopathy,spondylolisthesis  . Chronic neck pain   . Constipation, chronic   . Degenerative spinal arthritis    cervical and lumbar  . Depression   . Eczema   . GERD (gastroesophageal reflux disease)   . Hemorrhoids   . Hiatal hernia   . History of bronchitis    "I've had it a number of times"  . IBS (irritable bowel syndrome)    lactose intolerant  . Osteoarthritis    bilateral knees and shoulders,  little fingers  . TMJ (dislocation of temporomandibular joint)   . Wears glasses     Assessment: 65 YOF with history of BL osteoarthritis of the knees. Presented to [facility] on 2/13 for BL TKA. Patient not on anticoagulation prior to admission. Coumadin ordered post op for VTE prophylaxis. Ortho team plans to continue with Coumadin for 4 weeks, then transition to aspirin 81mg  daily.  - epidural removed on 2/15 - lovenox 30 mg q12h started on 2/15 PM -   Today, 06/03/2017: - INR is subtherapeutic but is trending up nicely to 1.71 today - hgb  dec to 7.8 - no bleeding documented - no significant drug-drug intxns - regular diet  Goal of Therapy:  INR 2-3  Plan:  - repeat warfarin 4 mg PO x1 - daily INR - continue enoxaparin until INR is therapeutic - Monitor for signs and symptoms of bleeding  Thank you Anette Guarneri, PharmD [phone] 06/03/2017 10:25 AM

## 2017-06-04 ENCOUNTER — Inpatient Hospital Stay (HOSPITAL_COMMUNITY): Payer: Medicare Other | Admitting: Physical Therapy

## 2017-06-04 ENCOUNTER — Inpatient Hospital Stay (HOSPITAL_COMMUNITY): Payer: Medicare Other | Admitting: Occupational Therapy

## 2017-06-04 ENCOUNTER — Inpatient Hospital Stay (HOSPITAL_COMMUNITY): Payer: Medicare Other

## 2017-06-04 DIAGNOSIS — F329 Major depressive disorder, single episode, unspecified: Secondary | ICD-10-CM

## 2017-06-04 DIAGNOSIS — M7989 Other specified soft tissue disorders: Secondary | ICD-10-CM

## 2017-06-04 LAB — PROTIME-INR
INR: 1.92
Prothrombin Time: 21.8 seconds — ABNORMAL HIGH (ref 11.4–15.2)

## 2017-06-04 MED ORDER — WARFARIN SODIUM 2 MG PO TABS
4.0000 mg | ORAL_TABLET | Freq: Once | ORAL | Status: AC
  Administered 2017-06-04: 4 mg via ORAL
  Filled 2017-06-04: qty 2

## 2017-06-04 MED ORDER — HYDROCORTISONE 1 % EX CREA
TOPICAL_CREAM | Freq: Three times a day (TID) | CUTANEOUS | Status: AC
  Administered 2017-06-04 – 2017-06-06 (×6): via TOPICAL
  Administered 2017-06-06: 1 via TOPICAL
  Administered 2017-06-07: 08:00:00 via TOPICAL
  Filled 2017-06-04: qty 28

## 2017-06-04 NOTE — Progress Notes (Signed)
Physical Therapy Session Note  Patient Details  Name: Sherry [name] MRN: [id number] Date of Birth: 12-23-1945  Today's Date: 06/04/2017 PT Individual Time: 7939-0300 PT Individual Time Calculation (min): 40 min   Short Term Goals: Week 1:  PT Short Term Goal 1 (Week 1): Pt will ambulate x 100 ft with RW and min assist PT Short Term Goal 2 (Week 1): Pt will perform sit<>stand with min assist PT Short Term Goal 3 (Week 1): Pt will perform x 10 straight leg raises bilaterally  Skilled Therapeutic Interventions/Progress Updates:   Session 1:  Pt in recliner and agreeable to therapy, c/o 5/10 bilateral knee pain. Total assist to don LE braces and transferred to w/c via stand pivot w/ RW. Pt self-propelled w/c to/from gym to work on general strengthening and endurance. Stand pivot transfer to/from mat via same technique. Performed BLE strengthening exercises in supine as listed below w/ frequent rest breaks 2/2 pain w/ AROM activities. Performed PROM knee flexion bilaterally w/ brief hold at end range, ~5-30 deg knee flexion bilaterally w/ pain at end range. Unable to reach full knee extension bilaterally. Returned to room via w/c and ended session in recliner. Ice applied to both knees for pain relief. Call bell within reach and all needs met.   BLE strengthening exercises: -assisted SAQs 2x10 -quad sets 1x10 -ankle pumps 2x10 -assistedheel slides 2x5 -abduction slides 1x10  Session 2:  Pt in recliner and agreeable to therapy, pain continues to be 5/10. Focused on tolerating WB and ambulation this session. Multiple sit<>stands from w/c and/or recliner w/ min guard and ambulated 30', 40', and 20' w/ min guard and close w/c follow. Seated rest 2/2 increased pain in knees during WB, improves w/ brief seated rest. Verbal and tactile cues for upright posture, however pt reports she feels "too tight" in front of hips to stand upright. Pt transferred to supine at end of session from EOB w/ mod  assist. Ended session in supine, call bell within reach and all needs met. Ice applied to both knees and set up w/ pillows to promote stretching into knee extension. Pt reports the stretch is tolerable.   Therapy Documentation Precautions:  Precautions Precautions: Knee, Fall Required Braces or Orthoses: Knee Immobilizer - Left, Knee Immobilizer - Right Knee Immobilizer - Right: Discontinue once straight leg raise with < 10 degree lag Knee Immobilizer - Left: Discontinue once straight leg raise with < 10 degree lag Restrictions Weight Bearing Restrictions: No RLE Weight Bearing: Weight bearing as tolerated LLE Weight Bearing: Weight bearing as tolerated Vital Signs: Therapy Vitals Temp: 98.5 F (36.9 C) Temp Source: Oral Pulse Rate: 92 Resp: 18 BP: 129/62 Patient Position (if appropriate): Sitting Oxygen Therapy SpO2: 99 % O2 Device: Not Delivered Pain: Pain Assessment Pain Assessment: Faces Faces Pain Scale: Hurts little more Pain Type: Surgical pain Pain Location: Knee Pain Orientation: Left;Right Pain Descriptors / Indicators: Discomfort Pain Onset: With Activity Pain Intervention(s): Repositioned  See Function Navigator for Current Functional Status.   Therapy/Group: Individual Therapy  Luisenrique Conran K Arnette 06/04/2017, 5:12 PM

## 2017-06-04 NOTE — Progress Notes (Signed)
[place] for warfarin Indication: VTE prophylaxis status post TKA   Allergies  Allergen Reactions  . Adhesive [Tape] Other (See Comments)    Blisters; "gets puffy and red" Liquid Bandaids    Patient Measurements: Height: 5\' 5"  (165.1 cm) Weight: 158 lb 11.7 oz (72 kg) IBW/kg (Calculated) : 57  Vital Signs: Temp: 98.1 F (36.7 C) (02/21 0500) Temp Source: Oral (02/21 0500) BP: 128/68 (02/21 0500) Pulse Rate: 91 (02/21 0500)  Labs: Recent Labs    06/02/17 0536 06/03/17 0543 06/04/17 0512  HGB  --  7.8*  --   HCT  --  23.8*  --   PLT  --  340  --   LABPROT 19.5* 19.9* 21.8*  INR 1.66 1.71 1.92  CREATININE  --  0.66  --     Estimated Creatinine Clearance: 65.1 mL/min (by C-G formula based on SCr of 0.66 mg/dL).   Medical History: Past Medical History:  Diagnosis Date  . Anxiety   . Carpal tunnel syndrome on both sides   . Chronic back pain    herniated disc,stenosis,radiculopathy,spondylolisthesis  . Chronic neck pain   . Constipation, chronic   . Degenerative spinal arthritis    cervical and lumbar  . Depression   . Eczema   . GERD (gastroesophageal reflux disease)   . Hemorrhoids   . Hiatal hernia   . History of bronchitis    "I've had it a number of times"  . IBS (irritable bowel syndrome)    lactose intolerant  . Osteoarthritis    bilateral knees and shoulders,  little fingers  . TMJ (dislocation of temporomandibular joint)   . Wears glasses     Assessment: 89 YOF with history of BL osteoarthritis of the knees. Presented to [facility] on 2/13 for BL TKA. Patient not on anticoagulation prior to admission. Coumadin ordered post op for VTE prophylaxis. Ortho team plans to continue with Coumadin for 4 weeks (from 05/28/17), then transition to aspirin 81mg  daily.  INR trending up    Goal of Therapy:  INR 2-3  Plan:  Repeat warfarin 4 mg PO x1 Daily INR Continue enoxaparin until INR is therapeutic Monitor  for signs and symptoms of bleeding  Thank you Anette Guarneri, PharmD [phone] 06/04/2017 11:17 AM

## 2017-06-04 NOTE — Progress Notes (Signed)
Social Work Patient ID: Sherry [name], female   DOB: [birth year], 72 y.o.   MRN: [id number]  Met with pt and husband to discuss team conference goals supervision level and target discharge date 3/2. She will have an aide when husband is working but he should be there a great deal. Discussed follow up therapies, will contact [facility] regarding prior referral. Work on discharge needs for next week.

## 2017-06-04 NOTE — Progress Notes (Signed)
Occupational Therapy Session Note  Patient Details  Name: Sherry [name] MRN: [id number] Date of Birth: 1945-12-17  Today's Date: 06/04/2017 OT Individual Time: 1445-1534 OT Individual Time Calculation (min): 49 min    Short Term Goals: Week 1:  OT Short Term Goal 1 (Week 1): Pt will complete toilet transfers with supervision to elevated 3:1 with RW.  OT Short Term Goal 2 (Week 1): Pt will complete walk-in shower transfers with supervision using RW and tub bench. OT Short Term Goal 3 (Week 1): Pt will perform LB bathing sit to stand with min assist.  OT Short Term Goal 4 (Week 1): Pt will perform LB dressing sit to stand with supervision and AE PRN.  Skilled Therapeutic Interventions/Progress Updates:    Pt completed transfer from bedside recliner to the wheelchair with mod assist to start session.  Therapist then took her down to the orthopedic gym via wheelchair.  After reaching the gym, she reported needing to go to the bathroom and therapist provided min facilitation for her to walk with the RW to the tub/shower room for use of the toilet.  Mod assist for transfer on and off of the toilet secondary to being a lower toilet without 3:1 over it.  Once finished she stood at the sink with min assist to wash her hands and then ambulated back to a therapy mat in the orthopedic gym.  Had pt work on sit to stand transitions from the EOM, initially with it elevated and then with it lowered.  She was able to complete 4 sit to stand transitions with min assist.  Next had her ambulate a short distance and pick up bean bags from the floor with use of the reacher.  Finished session with return to the room via wheelchair and transfer to the bedside recliner with call button and phone in reach.    Therapy Documentation Precautions:  Precautions Precautions: Knee, Fall Required Braces or Orthoses: Knee Immobilizer - Left, Knee Immobilizer - Right Knee Immobilizer - Right: Discontinue once straight leg  raise with < 10 degree lag Knee Immobilizer - Left: Discontinue once straight leg raise with < 10 degree lag Restrictions Weight Bearing Restrictions: No RLE Weight Bearing: Weight bearing as tolerated LLE Weight Bearing: Weight bearing as tolerated   Pain: Pain Assessment Pain Assessment: Faces Faces Pain Scale: Hurts little more Pain Type: Surgical pain Pain Location: Knee Pain Orientation: Left;Right Pain Descriptors / Indicators: Discomfort Pain Onset: With Activity Pain Intervention(s): Repositioned ADL: See Function Navigator for Current Functional Status.   Therapy/Group: Individual Therapy  Cainan Trull OTR/L 06/04/2017, 4:08 PM

## 2017-06-04 NOTE — Progress Notes (Signed)
Orthopedic Tech Progress Note Patient Details:  Sherry [name] 1946-03-31 [id number]  CPM Left Knee CPM Left Knee: On Left Knee Flexion (Degrees): 40 Left Knee Extension (Degrees): 0 CPM Right Knee CPM Right Knee: On Right Knee Flexion (Degrees): 40 Right Knee Extension (Degrees): 0  Post Interventions Patient Tolerated: Well Instructions Provided: Care of device  Braulio Bosch 06/04/2017, 5:50 PM

## 2017-06-04 NOTE — Progress Notes (Signed)
[place] PHYSICAL MEDICINE & REHABILITATION     PROGRESS NOTE  Subjective/Complaints:  Patient seen lying in bed this morning. She states she slept well overnight. She states that she had exhausting day therapies yesterday. She notes that she is depressed, provided encouragement.  ROS: + Depression. Denies CP, SOB, N/V/D.  Objective: Vital Signs: Blood pressure 128/68, pulse 91, temperature 98.1 F (36.7 C), temperature source Oral, resp. rate 18, height 5\' 5"  (1.651 m), weight 72 kg (158 lb 11.7 oz), last menstrual period 04/14/1997, SpO2 97 %. No results found. Recent Labs    06/03/17 0543  WBC 5.5  HGB 7.8*  HCT 23.8*  PLT 340   Recent Labs    06/03/17 0543  NA 138  K 3.6  CL 103  GLUCOSE 105*  BUN 11  CREATININE 0.66  CALCIUM 8.4*   CBG (last 3)  No results for input(s): GLUCAP in the last 72 hours.  Wt Readings from Last 3 Encounters:  06/02/17 72 kg (158 lb 11.7 oz)  05/27/17 67.1 kg (148 lb)  05/22/17 67.1 kg (148 lb)    Physical Exam:  BP 128/68 (BP Location: Left Arm)   Pulse 91   Temp 98.1 F (36.7 C) (Oral)   Resp 18   Ht 5\' 5"  (1.651 m)   Wt 72 kg (158 lb 11.7 oz)   LMP 04/14/1997   SpO2 97%   BMI 26.41 kg/m  Constitutional: She appears well-developed. NAD. HENT: Normocephalic. Atraumatic.  Eyes: EOM are normal. No discharge.  Cardiovascular: RRR. No JVD. Respiratory: Effort normal and breath sounds normal. GI: Bowel sounds are normal. She exhibits no distension.  Musc: B/l knee tenderness with edema Neurological.  Follows full commands.  Motor: B/l upper extremities 5/5 proximal to distal.  B/l lower extremities: hip flexors 2/5, ankle dorsi/plantar flexion 5/5 (stable) Skin. Warm and dry bilateral knee incisions with some erythema along left knee incision. No drainage noted.   Assessment/Plan: 1. 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 Physiatrist is providing close team supervision and 24 hour management of active medical problems listed below. Physiatrist and rehab team continue to assess barriers to discharge/monitor patient progress toward functional and medical goals.  Function:  Bathing Bathing position   Position: Shower  Bathing parts Body parts bathed by patient: Right arm, Left arm, Abdomen, Chest, Front perineal area, Buttocks, Right upper leg, Left upper leg Body parts bathed by helper: Back, Left lower leg, Right lower leg  Bathing assist        Upper Body Dressing/Undressing Upper body dressing   What is the patient wearing?: Button up shirt         Button up shirt - Perfomed by patient: Thread/unthread right sleeve, Thread/unthread left sleeve, Pull shirt around back, Button/unbutton shirt      Upper body assist        Lower Body Dressing/Undressing Lower body dressing   What is the patient wearing?: Pants, Non-skid slipper socks     Pants- Performed by patient: Thread/unthread right pants leg Pants- Performed by helper: Thread/unthread left pants leg, Pull pants up/down   Non-skid slipper socks- Performed by helper: Don/doff right sock, Don/doff left sock                  Lower body assist        Toileting Toileting   Toileting steps completed by patient:  Performs perineal hygiene Toileting steps completed by helper: Adjust clothing prior to toileting, Adjust clothing after toileting    Toileting assist     Transfers Chair/bed transfer   Chair/bed transfer method: Stand pivot Chair/bed transfer assist level: Moderate assist (Pt 50 - 74%/lift or lower) Chair/bed transfer assistive device: Armrests, Medical sales representative     Max distance: 30 ft Assist level: Touching or steadying assistance (Pt > 75%)   Wheelchair   Type: Manual Max wheelchair distance: 128ft Assist Level: Supervision  or verbal cues  Cognition Comprehension Comprehension assist level: Follows complex conversation/direction with extra time/assistive device  Expression Expression assist level: Expresses complex ideas: With extra time/assistive device  Social Interaction Social Interaction assist level: Interacts appropriately with others - No medications needed.  Problem Solving Problem solving assist level: Solves complex 90% of the time/cues < 10% of the time  Memory Memory assist level: Complete Independence: No helper    Medical Problem List and Plan:  1. Decreased functional mobility secondary to end-stage osteoarthritis bilateral knees. Status post bilateral TKA 05/27/2017. Weightbearing as tolerated.    Continue CIR  2. DVT Prophylaxis/Anticoagulation: Coumadin for DVT prophylaxis. Plan is for Coumadin 4 weeks then aspirin 81 mg daily    Vascular study negative for DVT   INR subtherapeutic on 2/21 3. Pain Management: Neurontin 300 mg 3 times a day, Valium and oxycodone as needed. Monitor narcotics for increased sedation    Scheduled ice to knees while awake  4. Mood: Provide emotional support, with reactive depression   Will request neuropsych consult   Team to provide encouragement 5. Neuropsych: This patient is capable of making decisions on her own behalf.  6. Skin/Wound Care: Routine skin checks  7. Fluids/Electrolytes/Nutrition: Routine I&O's    BMP within acceptable range on 2/20   Labs ordered for tomorrow 8. Acute blood loss anemia.    Hb 7.8 on 2/20   Labs ordered for tomorrow   Cont to monitor 9. Chronic constipation. Continue Movantik, qod miralax, dulcolax suppository tonight    Hx of IBS  10.GERD.Prilosec              -prn maalox 11. Tachycardia    Monitor in accordance with pain and increasing activity   Improving  12. Hypoalbuminemia   Supplement initiated on 2/20  LOS (Days) 2 A FACE TO FACE EVALUATION WAS PERFORMED  Sherry [name] Sherry [name] 06/04/2017 9:10 AM

## 2017-06-04 NOTE — Progress Notes (Signed)
Occupational Therapy Session Note  Patient Details  Name: Sherry [name] MRN: [id number] Date of Birth: May 29, 1945  Today's Date: 06/04/2017 OT Individual Time: 1003-1100 OT Individual Time Calculation (min): 57 min    Short Term Goals: Week 1:  OT Short Term Goal 1 (Week 1): Pt will complete toilet transfers with supervision to elevated 3:1 with RW.  OT Short Term Goal 2 (Week 1): Pt will complete walk-in shower transfers with supervision using RW and tub bench. OT Short Term Goal 3 (Week 1): Pt will perform LB bathing sit to stand with min assist.  OT Short Term Goal 4 (Week 1): Pt will perform LB dressing sit to stand with supervision and AE PRN.  Skilled Therapeutic Interventions/Progress Updates:    Pt completed toileting, bathing, and dressing during session.  She needed mod assist for supine to sit EOB and then mod assist for sit to stand from the EOB.  She ambulated to the toilet with min guard assist once standing, using the RW for support.  Mod assist for sit to stand from the toilet and transfer to the shower bench once toileting was completed.  She completed all bathing in sitting with lateral leans side to side for washing peri area.  Dressing completed sit to stand from the wheelchair.  She was able to thread all garments over her feet, except for TEDs but needs mod assist for sit to stand in order to pull them up over her hips.  When standing to completed this pt reported being very fatigued and needed min assist to finish pulling underpants and pants over her hips.  Finished session with transfer from wheelchair to bedside chair with mod assist to complete session.    Therapy Documentation Precautions:  Precautions Precautions: Knee, Fall Required Braces or Orthoses: Knee Immobilizer - Left, Knee Immobilizer - Right Knee Immobilizer - Right: Discontinue once straight leg raise with < 10 degree lag Knee Immobilizer - Left: Discontinue once straight leg raise with < 10 degree  lag Restrictions Weight Bearing Restrictions: No RLE Weight Bearing: Weight bearing as tolerated LLE Weight Bearing: Weight bearing as tolerated   Pain: Pain Assessment Pain Assessment: 0-10 Pain Score: 4  Pain Type: Surgical pain Pain Location: Knee Pain Orientation: Right;Left Pain Descriptors / Indicators: Aching Pain Frequency: Occasional Pain Onset: With Activity Patients Stated Pain Goal: 3 Pain Intervention(s): Repositioned;Medication (See eMAR) ADL: See Function Navigator for Current Functional Status.   Therapy/Group: Individual Therapy  Ruble Pumphrey OTR/L 06/04/2017, 12:44 PM

## 2017-06-04 NOTE — Progress Notes (Signed)
PMR Admission Coordinator Pre-Admission Assessment  Patient: Sherry [name] is an 72 y.o., female MRN: [id number] DOB: [birth year] Height: 5' 5" (165.1 cm) Weight: 67.1 kg (148 lb)                                                                                                                                                  Insurance Information HMO:     PPO:      PCP:      IPA:      80/20:      OTHER:  PRIMARY: U[employer] Medicare       Policy#: [id number]      Subscriber: Self CM Name: Sharee Pimple      Phone#: [phone]     Fax#: [phone] Pre-Cert#: [id number]      Employer: Retired Benefits:  Phone #: [phone]     Name: Verified online at [email or web address] Eff. Date: 04/14/17     Deduct: $0      Out of Pocket Max: $4500      Life Max: N/A CIR: $345 a day, days 1-5; $0 a day, days 6+      SNF: $0 a day, days 1-20; $160 a day, days 21-49; $0 a day, days 50-100 Outpatient: Necessity     Co-Pay: $40 per visit  Home Health: Necessity, 100%       Co-Pay: $0 DME: 80%     Co-Pay: 20% Providers: In-network   SECONDARY: None        Medicaid Application Date:       Case Manager:  Disability Application Date:       Case Worker:   Emergency Contact Information        Contact Information    Name Relation Home Work Mobile   Dizon,Richard A Spouse [phone]   Haynes,Kristen Daughter [phone]  [phone]     Current Medical History  Patient Admitting Diagnosis: Bilateral total knee replacements   History of Present Illness: 72 year old right-handed female with history of chronic back and neck pain as well as osteoarthritis degenerative changes bilateral knees. Per chart review patient lives with spouse. Independent prior to admission and active. She takes care of her grandchildren. Two-level home with 2 steps to entry. Bed and bath are upstairs. Presented2/13/2019with progressive knee pain bilaterally. No relief with cortisone injections exercise modifications.  Underwent bilateral total knee arthroplasty 05/27/2017 per Dr. Maureen Ralphs. Hospital course pain management. Patient initially had an epidural in place. Weightbearing as tolerated bilateral lower extremities. Placed on Coumadin for DVT prophylaxis. Acute blood loss anemia 8.5 monitored. Physical and occupational therapy evaluations completed with recommendations of physical medicine rehabilitation consult and patient was admitted for a comprehensive rehabilitation program 06/02/17.    Past Medical History      Past Medical History:  Diagnosis Date  . Anxiety   . Carpal tunnel syndrome on both  sides   . Chronic back pain    herniated disc,stenosis,radiculopathy,spondylolisthesis  . Chronic neck pain   . Constipation, chronic   . Degenerative spinal arthritis    cervical and lumbar  . Depression   . Eczema   . GERD (gastroesophageal reflux disease)   . Hemorrhoids   . Hiatal hernia   . History of bronchitis    "I've had it a number of times"  . IBS (irritable bowel syndrome)    lactose intolerant  . Osteoarthritis    bilateral knees and shoulders,  little fingers  . TMJ (dislocation of temporomandibular joint)   . Wears glasses     Family History  family history includes Breast cancer in her paternal aunt; Colon cancer in her maternal grandfather; Kidney disease in her father; Leukemia in her sister; Parkinson's disease in her mother; Rheum arthritis in her father.  Prior Rehab/Hospitalizations:  Has the patient had major surgery during 100 days prior to admission? No  Current Medications   Current Facility-Administered Medications:  .  0.9 %  sodium chloride infusion, , Intravenous, Continuous, Perkins, Alexzandrew L, PA-C, Last Rate: 50 mL/hr at 05/29/17 1146 .  acetaminophen (TYLENOL) tablet 650 mg, 650 mg, Oral, Q4H PRN, 650 mg at 06/01/17 1339 **OR** acetaminophen (TYLENOL) suppository 650 mg, 650 mg, Rectal, Q4H PRN, Aluisio, Frank, MD .  bisacodyl  (DULCOLAX) suppository 10 mg, 10 mg, Rectal, Daily PRN, Aluisio, Pilar Plate, MD .  diazepam (VALIUM) tablet 5-10 mg, 5-10 mg, Oral, Q6H PRN, Gaynelle Arabian, MD, 5 mg at 06/02/17 0518 .  dicyclomine (BENTYL) capsule 10 mg, 10 mg, Oral, TID PRN, Gaynelle Arabian, MD, 10 mg at 05/28/17 0511 .  diphenhydrAMINE (BENADRYL) 12.5 MG/5ML elixir 12.5-25 mg, 12.5-25 mg, Oral, Q4H PRN, Aluisio, Frank, MD .  docusate sodium (COLACE) capsule 100 mg, 100 mg, Oral, BID, Aluisio, Frank, MD, 100 mg at 06/01/17 2357 .  enoxaparin (LOVENOX) injection 30 mg, 30 mg, Subcutaneous, Q12H, Aluisio, Frank, MD, 30 mg at 06/01/17 2357 .  famotidine (PEPCID) tablet 20 mg, 20 mg, Oral, PRN, Gaynelle Arabian, MD, 20 mg at 05/29/17 1450 .  gabapentin (NEURONTIN) capsule 300 mg, 300 mg, Oral, TID, Aluisio, Frank, MD, 300 mg at 06/01/17 2357 .  loratadine (CLARITIN) tablet 10 mg, 10 mg, Oral, Daily PRN, Aluisio, Frank, MD .  menthol-cetylpyridinium (CEPACOL) lozenge 3 mg, 1 lozenge, Oral, PRN **OR** phenol (CHLORASEPTIC) mouth spray 1 spray, 1 spray, Mouth/Throat, PRN, Aluisio, Frank, MD .  metoCLOPramide (REGLAN) tablet 5-10 mg, 5-10 mg, Oral, Q8H PRN **OR** metoCLOPramide (REGLAN) injection 5-10 mg, 5-10 mg, Intravenous, Q8H PRN, Aluisio, Frank, MD .  morphine 2 MG/ML injection 1-2 mg, 1-2 mg, Intravenous, Q3H PRN, Swinteck, Aaron Edelman, MD .  naloxegol oxalate (MOVANTIK) tablet 25 mg, 25 mg, Oral, Daily, Aluisio, Frank, MD, 25 mg at 06/01/17 0803 .  omeprazole (PRILOSEC) capsule 20 mg, 20 mg, Oral, Daily PRN, Perkins, Alexzandrew L, PA-C, 20 mg at 06/01/17 2033 .  ondansetron (ZOFRAN) tablet 4 mg, 4 mg, Oral, Q6H PRN **OR** ondansetron (ZOFRAN) injection 4 mg, 4 mg, Intravenous, Q6H PRN, Aluisio, Frank, MD .  oxyCODONE (Oxy IR/ROXICODONE) immediate release tablet 10-20 mg, 10-20 mg, Oral, Q3H PRN, Gaynelle Arabian, MD, 10 mg at 06/02/17 0518 .  oxyCODONE (Oxy IR/ROXICODONE) immediate release tablet 5 mg, 5 mg, Oral, Q3H PRN, Gaynelle Arabian, MD, 5  mg at 06/02/17 0010 .  polyethylene glycol (MIRALAX / GLYCOLAX) packet 17 g, 17 g, Oral, Vela Prose, MD, 17 g at 06/01/17 0803 .  polyethylene  glycol (MIRALAX / GLYCOLAX) packet 17 g, 17 g, Oral, Daily PRN, Gaynelle Arabian, MD, 17 g at 05/29/17 0906 .  sodium phosphate (FLEET) 7-19 GM/118ML enema 1 enema, 1 enema, Rectal, Once PRN, Gaynelle Arabian, MD .  Warfarin - Pharmacist Dosing Inpatient, , Does not apply, q1800, Gaynelle Arabian, MD  Patients Current Diet: Diet regular Room service appropriate? Yes; Fluid consistency: Thin Diet general  Precautions / Restrictions Precautions Precautions: Knee, Fall Precaution Comments: D/C KI Restrictions Weight Bearing Restrictions: No RLE Weight Bearing: Weight bearing as tolerated LLE Weight Bearing: Weight bearing as tolerated Other Position/Activity Restrictions: WBAT   Has the patient had 2 or more falls or a fall with injury in the past year?No  Prior Activity Level Community (5-7x/wk): Prior to admission patient was fully independent and active.  She had the most difficulty with stairs, bending, and lifting.   Home Assistive Devices / Equipment Home Assistive Devices/Equipment: Eyeglasses, Radio producer (specify quad or straight), Grab bars in shower, Walker (specify type), Bedside commode/3-in-1, Shower chair with back Home Equipment: Walker - 2 wheels, Shower seat, Adaptive equipment, Grab bars - toilet, Grab bars - tub/shower  Prior Device Use: Indicate devices/aids used by the patient prior to current illness, exacerbation or injury? None of the above  Prior Functional Level Prior Function Level of Independence: Independent Comments: cares for grandchildren. Could not carry baby upstairs  Self Care: Did the patient need help bathing, dressing, using the toilet or eating? Independent  Indoor Mobility: Did the patient need assistance with walking from room to room (with or without device)? Independent  Stairs: Did the  patient need assistance with internal or external stairs (with or without device)? Independent  Functional Cognition: Did the patient need help planning regular tasks such as shopping or remembering to take medications? Independent  Current Functional Level Cognition  Overall Cognitive Status: Within Functional Limits for tasks assessed Orientation Level: Oriented X4 General Comments: slow to respond verbally; answered appropriately but didn't really focus on person    Extremity Assessment (includes Sensation/Coordination)  Upper Extremity Assessment: Defer to OT evaluation  Lower Extremity Assessment: RLE deficits/detail, LLE deficits/detail RLE Deficits / Details: 10-45 knee flexion, 3/5 quad strength LLE Deficits / Details: 10-50 knee flexion, 3+ quad stength    ADLs  Overall ADL's : Needs assistance/impaired Eating/Feeding: Independent Grooming: Set up, Sitting Upper Body Bathing: Set up, Sitting Lower Body Bathing: Moderate assistance, Sit to/from stand, +2 for physical assistance(simulated) Upper Body Dressing : Minimal assistance, Sitting(lines) Lower Body Dressing: Total assistance, +2 for physical assistance, Sit to/from stand General ADL Comments: simulated toilet transfer:  mod +2 assistance to chair. Ambulated with min +2 for safety.      Mobility  Overal bed mobility: Needs Assistance Bed Mobility: Sit to Supine, Supine to Sit Supine to sit: Mod assist, HOB elevated Sit to supine: Mod assist General bed mobility comments: use of leg lifter for legs, extra time and multimodal cues    Transfers  Overall transfer level: Needs assistance Equipment used: Rolling walker (2 wheeled) Transfers: Sit to/from Stand Sit to Stand: +2 physical assistance, Mod assist, From elevated surface General transfer comment: Cues for hand and foot placement , patient able to work feet back in preparation to stand. 2 assist to stand from and cues  for step by step sequence to walk  legs forward  then reach back for armrests.     Ambulation / Gait / Stairs / Wheelchair Mobility  Ambulation/Gait Ambulation/Gait assistance: Mod assist, +2 physical assistance, +2 safety/equipment  Ambulation Distance (Feet): 18 Feet Assistive device: Rolling walker (2 wheeled) Gait Pattern/deviations: Step-to pattern, Decreased step length - right, Decreased step length - left, Decreased stride length, Antalgic, Trunk flexed General Gait Details: Cues for LE advancement, upper trunk control, posture and RW positioning and sequencing.  Close chair follow for safety.   Gait velocity: decreased Gait velocity interpretation: Below normal speed for age/gender    Posture / Balance Balance Overall balance assessment: Needs assistance Sitting-balance support: Feet supported, Bilateral upper extremity supported Sitting balance-Leahy Scale: Fair Standing balance support: During functional activity, Bilateral upper extremity supported Standing balance-Leahy Scale: Poor    Special needs/care consideration BiPAP/CPAP: No CPM: Yes, bilateral  Continuous Drip IV: No Dialysis: No         Life Vest: No Oxygen: No Special Bed: No Trach Size: No Wound Vac (area): No       Skin: WDL                               Bowel mgmt: Continent, last BM 05/27/17 Bladder mgmt: Continent with change in documentation to amber and cloudy 06/01/17 Diabetic mgmt: No     Previous Home Environment Living Arrangements: Spouse/significant other Available Help at Discharge: Family Type of Home: House Home Layout: Two level Alternate Level Stairs-Rails: Right Alternate Level Stairs-Number of Steps: 13 Home Access: Stairs to enter Technical brewer of Steps: 2 Bathroom Shower/Tub: Multimedia programmer: Standard Home Care Services: No Additional Comments: bed and bath upstairs. Walk in shower with seat. HH shower, higher commodes  Discharge Living Setting Plans for Discharge Living  Setting: Patient's home, Lives with (comment)(Spouse) Type of Home at Discharge: House Discharge Home Layout: Two level Alternate Level Stairs-Rails: Right Alternate Level Stairs-Number of Steps: 14-15 steps, 1 flight Discharge Home Access: Stairs to enter Entrance Stairs-Rails: None Entrance Stairs-Number of Steps: 2 Discharge Bathroom Shower/Tub: Walk-in shower Discharge Bathroom Toilet: Handicapped height Discharge Bathroom Accessibility: Yes How Accessible: Accessible via walker Does the patient have any problems obtaining your medications?: No  Social/Family/Support Systems Patient Roles: Spouse, Parent, Other (Comment)(Grandparent ) Contact Information: Spouse: Ezrie Bunyan [phone] Anticipated Caregiver: None Mod I goals  Anticipated Caregiver's Contact Information: N/A Ability/Limitations of Caregiver: Spouse with recent lower back injury  Caregiver Availability: 24/7 Discharge Plan Discussed with Primary Caregiver: Yes Is Caregiver In Agreement with Plan?: Yes Does Caregiver/Family have Issues with Lodging/Transportation while Pt is in Rehab?: No  Goals/Additional Needs Patient/Family Goal for Rehab: PT/OT: Mod I  Expected length of stay: 7-10 days  Cultural Considerations: None Dietary Needs: Regular textures and thin liquids  Equipment Needs: TBD Special Service Needs: None Additional Information: None Pt/Family Agrees to Admission and willing to participate: Yes Program Orientation Provided & Reviewed with Pt/Caregiver Including Roles  & Responsibilities: Yes  Barriers to Discharge: Home environment access/layout  Decrease burden of Care through IP rehab admission: No  Possible need for SNF placement upon discharge: No  Patient Condition: I have reviewed patient's medical record and met with patient at bedside.  Patient is eager to get to rehab to facilitate her safe transition home.  Given that patient was very active and independent living in a  multi-level home prior to admission she makes an excellent candidate for an IP Rehab admission.  Patient requires ongoing PT and OT therapies and would greatly benefit from the coordinated care of the IP Rehab team.  She will receive 3 hours of skilled therapy a day (5/7  days of the week) with MD and nursing interventions to manage her pain, INR, and anemia.  Current performance with ADLs is Mod A +2 for transfers and gait.    Preadmission Screen Completed By:  Gunnar Fusi, 06/02/2017 9:11 AM ______________________________________________________________________   Discussed status with Dr. Naaman Plummer on 06/02/17 at 0910 and received telephone approval for admission today.  Admission Coordinator:  Gunnar Fusi, time 0910/Date 06/02/17             Cosigned by: Meredith Staggers, MD at 06/02/2017 9:36 AM  Revision History

## 2017-06-04 NOTE — Progress Notes (Signed)
*  Preliminary Results* Bilateral lower extremity venous duplex completed. Bilateral lower extremities are negative for deep vein thrombosis. There is no evidence of Baker's cyst bilaterally.  06/04/2017 8:55 AM Maudry Mayhew, BS, RVT, RDCS, RDMS

## 2017-06-05 ENCOUNTER — Inpatient Hospital Stay (HOSPITAL_COMMUNITY): Payer: Medicare Other | Admitting: Physical Therapy

## 2017-06-05 ENCOUNTER — Inpatient Hospital Stay (HOSPITAL_COMMUNITY): Payer: Medicare Other | Admitting: Occupational Therapy

## 2017-06-05 DIAGNOSIS — R791 Abnormal coagulation profile: Secondary | ICD-10-CM

## 2017-06-05 LAB — CBC WITH DIFFERENTIAL/PLATELET
Basophils Absolute: 0 10*3/uL (ref 0.0–0.1)
Basophils Relative: 1 %
Eosinophils Absolute: 0.3 10*3/uL (ref 0.0–0.7)
Eosinophils Relative: 5 %
HCT: 25.2 % — ABNORMAL LOW (ref 36.0–46.0)
Hemoglobin: 8.2 g/dL — ABNORMAL LOW (ref 12.0–15.0)
Lymphocytes Relative: 34 %
Lymphs Abs: 2 10*3/uL (ref 0.7–4.0)
MCH: 31.1 pg (ref 26.0–34.0)
MCHC: 32.5 g/dL (ref 30.0–36.0)
MCV: 95.5 fL (ref 78.0–100.0)
Monocytes Absolute: 0.8 10*3/uL (ref 0.1–1.0)
Monocytes Relative: 14 %
Neutro Abs: 2.7 10*3/uL (ref 1.7–7.7)
Neutrophils Relative %: 46 %
Platelets: 445 10*3/uL — ABNORMAL HIGH (ref 150–400)
RBC: 2.64 MIL/uL — ABNORMAL LOW (ref 3.87–5.11)
RDW: 13.6 % (ref 11.5–15.5)
WBC: 5.8 10*3/uL (ref 4.0–10.5)

## 2017-06-05 LAB — BASIC METABOLIC PANEL
Anion gap: 10 (ref 5–15)
BUN: 15 mg/dL (ref 6–20)
CO2: 24 mmol/L (ref 22–32)
Calcium: 8.7 mg/dL — ABNORMAL LOW (ref 8.9–10.3)
Chloride: 105 mmol/L (ref 101–111)
Creatinine, Ser: 0.62 mg/dL (ref 0.44–1.00)
GFR calc Af Amer: >60 mL/min (ref >60)
GFR calc non Af Amer: >60 mL/min (ref >60)
Glucose, Bld: 101 mg/dL — ABNORMAL HIGH (ref 65–99)
Potassium: 3.9 mmol/L (ref 3.5–5.1)
Sodium: 139 mmol/L (ref 135–145)

## 2017-06-05 LAB — PROTIME-INR
INR: 1.82
Prothrombin Time: 20.9 seconds — ABNORMAL HIGH (ref 11.4–15.2)

## 2017-06-05 MED ORDER — TRAMADOL HCL 50 MG PO TABS
25.0000 mg | ORAL_TABLET | ORAL | Status: DC | PRN
  Administered 2017-06-05 – 2017-06-07 (×8): 25 mg via ORAL
  Filled 2017-06-05 (×8): qty 1

## 2017-06-05 MED ORDER — OXYCODONE HCL 5 MG PO TABS
5.0000 mg | ORAL_TABLET | Freq: Three times a day (TID) | ORAL | Status: DC | PRN
  Administered 2017-06-05 – 2017-06-11 (×6): 5 mg via ORAL
  Filled 2017-06-05 (×9): qty 1

## 2017-06-05 MED ORDER — WARFARIN SODIUM 3 MG PO TABS
6.0000 mg | ORAL_TABLET | Freq: Once | ORAL | Status: AC
  Administered 2017-06-05: 6 mg via ORAL
  Filled 2017-06-05: qty 2

## 2017-06-05 MED ORDER — OXYCODONE HCL 5 MG PO TABS
10.0000 mg | ORAL_TABLET | Freq: Three times a day (TID) | ORAL | Status: DC | PRN
  Administered 2017-06-06 – 2017-06-13 (×16): 10 mg via ORAL
  Filled 2017-06-05 (×15): qty 2

## 2017-06-05 NOTE — Progress Notes (Signed)
Occupational Therapy Session Note  Patient Details  Name: Sherry [name] MRN: [id number] Date of Birth: 14-Aug-1945  Today's Date: 06/05/2017 OT Individual Time: 0900-1000 OT Individual Time Calculation (min): 60 min    Short Term Goals: Week 1:  OT Short Term Goal 1 (Week 1): Pt will complete toilet transfers with supervision to elevated 3:1 with RW.  OT Short Term Goal 2 (Week 1): Pt will complete walk-in shower transfers with supervision using RW and tub bench. OT Short Term Goal 3 (Week 1): Pt will perform LB bathing sit to stand with min assist.  OT Short Term Goal 4 (Week 1): Pt will perform LB dressing sit to stand with supervision and AE PRN.  Skilled Therapeutic Interventions/Progress Updates:    Pt completed shower and dressing during session.  Min assist for ambulation to the shower bench with use of the RW.  She was able to remove all dirty clothing with min assist sit to stand and with use of the reacher to help remove them from both LEs.  She was able to complete all bathing in sitting with supervision, and lateral leans side to side to wash and dry her peri area.  She then transferred out to the wheelchair for dressing.  UB dressing with supervision.  She was also able to donn her underpants and pants over her feet with supervision and then stand to pull them up with min assist.  Max assist for TEDs and for slip on shoes to finish dressing.  Pt with two episodes of standing with LOB posteriorly, requiring min assist to regain balance.  Finished session by having pt ambulate to the bathroom and pick up her dirty clothing with use of the reacher.  Finished session with pt in bedside recliner with call button and phone in reach.    Therapy Documentation Precautions:  Precautions Precautions: Knee, Fall Required Braces or Orthoses: Knee Immobilizer - Left, Knee Immobilizer - Right Knee Immobilizer - Right: Discontinue once straight leg raise with < 10 degree lag Knee Immobilizer -  Left: Discontinue once straight leg raise with < 10 degree lag Restrictions Weight Bearing Restrictions: No RLE Weight Bearing: Weight bearing as tolerated LLE Weight Bearing: Weight bearing as tolerated  Pain: Pain Assessment Pain Assessment: No/denies pain Pain Type: Surgical pain Pain Location: Knee Pain Orientation: Right;Left Pain Onset: With Activity Pain Intervention(s): Repositioned ADL: See Function Navigator for Current Functional Status.   Therapy/Group: Individual Therapy  Lisette Mancebo OTR/L 06/05/2017, 12:43 PM

## 2017-06-05 NOTE — Progress Notes (Signed)
[place] PHYSICAL MEDICINE & REHABILITATION     PROGRESS NOTE  Subjective/Complaints:  Patient seen lying in bed this morning. She states she slept fairly overnight because it is difficult for her to maintain one position throughout the night. She complains of pain during therapies and requests adjustment of medications.  ROS: Denies CP, SOB, N/V/D.  Objective: Vital Signs: Blood pressure (!) 151/68, pulse 94, temperature 98.1 F (36.7 C), temperature source Oral, resp. rate 18, height 5\' 5"  (1.651 m), weight 72 kg (158 lb 11.7 oz), last menstrual period 04/14/1997, SpO2 94 %. No results found. Recent Labs    06/03/17 0543 06/05/17 0617  WBC 5.5 5.8  HGB 7.8* 8.2*  HCT 23.8* 25.2*  PLT 340 445*   Recent Labs    06/03/17 0543 06/05/17 0617  NA 138 139  K 3.6 3.9  CL 103 105  GLUCOSE 105* 101*  BUN 11 15  CREATININE 0.66 0.62  CALCIUM 8.4* 8.7*   CBG (last 3)  No results for input(s): GLUCAP in the last 72 hours.  Wt Readings from Last 3 Encounters:  06/02/17 72 kg (158 lb 11.7 oz)  05/27/17 67.1 kg (148 lb)  05/22/17 67.1 kg (148 lb)    Physical Exam:  BP (!) 151/68 (BP Location: Left Arm)   Pulse 94   Temp 98.1 F (36.7 C) (Oral)   Resp 18   Ht 5\' 5"  (1.651 m)   Wt 72 kg (158 lb 11.7 oz)   LMP 04/14/1997   SpO2 94%   BMI 26.41 kg/m  Constitutional: She appears well-developed. NAD. HENT: Normocephalic. Atraumatic.  Eyes: EOM are normal. No discharge.  Cardiovascular: RRR. No JVD. Respiratory: Effort normal and breath sounds normal. GI: Bowel sounds are normal. She exhibits no distension.  Musc: B/l knee tenderness with edema and tenderness Neurological.  Follows full commands.  Motor: B/l upper extremities 5/5 proximal to distal.  B/l lower extremities: hip flexors 2/5, ankle dorsi/plantar flexion 5/5 (unchanged) Skin. Warm and dry bilateral knee incisions with some erythema along left knee incision. No drainage noted.   Assessment/Plan: 1.  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 Physiatrist is providing close team supervision and 24 hour management of active medical problems listed below. Physiatrist and rehab team continue to assess barriers to discharge/monitor patient progress toward functional and medical goals.  Function:  Bathing Bathing position   Position: Shower  Bathing parts Body parts bathed by patient: Right arm, Left arm, Abdomen, Chest, Front perineal area, Buttocks, Right upper leg, Left upper leg Body parts bathed by helper: Back, Left lower leg, Right lower leg  Bathing assist Assist Level: Touching or steadying assistance(Pt > 75%)      Upper Body Dressing/Undressing Upper body dressing   What is the patient wearing?: Pull over shirt/dress     Pull over shirt/dress - Perfomed by patient: Thread/unthread right sleeve, Thread/unthread left sleeve, Put head through opening, Pull shirt over trunk   Button up shirt - Perfomed by patient: Thread/unthread right sleeve, Thread/unthread left sleeve, Pull shirt around back, Button/unbutton shirt      Upper body assist        Lower Body Dressing/Undressing Lower body dressing   What is the patient wearing?: Pants, Non-skid slipper socks     Pants- Performed by patient: Thread/unthread right pants leg, Thread/unthread left pants leg, Pull pants up/down Pants- Performed by helper: Thread/unthread  left pants leg, Pull pants up/down Non-skid slipper socks- Performed by patient: Don/doff right sock, Don/doff left sock Non-skid slipper socks- Performed by helper: Don/doff right sock, Don/doff left sock                  Lower body assist Assist for lower body dressing: Touching or steadying assistance (Pt > 75%)      Toileting Toileting   Toileting steps completed by patient: Performs  perineal hygiene, Adjust clothing prior to toileting Toileting steps completed by helper: Adjust clothing after toileting    Toileting assist     Transfers Chair/bed transfer   Chair/bed transfer method: Stand pivot Chair/bed transfer assist level: Touching or steadying assistance (Pt > 75%) Chair/bed transfer assistive device: Armrests, Medical sales representative     Max distance: 40' Assist level: Touching or steadying assistance (Pt > 75%)   Wheelchair   Type: Manual Max wheelchair distance: 150' Assist Level: Supervision or verbal cues  Cognition Comprehension Comprehension assist level: Follows complex conversation/direction with extra time/assistive device  Expression Expression assist level: Expresses complex ideas: With extra time/assistive device  Social Interaction Social Interaction assist level: Interacts appropriately with others - No medications needed.  Problem Solving Problem solving assist level: Solves complex 90% of the time/cues < 10% of the time  Memory Memory assist level: Complete Independence: No helper    Medical Problem List and Plan:  1. Decreased functional mobility secondary to end-stage osteoarthritis bilateral knees. Status post bilateral TKA 05/27/2017. Weightbearing as tolerated.    Continue CIR  2. DVT Prophylaxis/Anticoagulation: Coumadin for DVT prophylaxis. Plan is for Coumadin 4 weeks then aspirin 81 mg daily    Vascular study negative for DVT   INR subtherapeutic on 2/22 3. Pain Management: Neurontin 300 mg 3 times a day, Valium and oxycodone as needed. Monitor narcotics for increased sedation    Scheduled ice to knees while awake , encouraged use   Patient extremely sensitive to oxycodone, added when necessary low dose tramadol on 2/22 4. Mood: Provide emotional support, with reactive depression   Will request neuropsych consult   Team to provide encouragement 5. Neuropsych: This patient is capable of making decisions on her own  behalf.  6. Skin/Wound Care: Routine skin checks  7. Fluids/Electrolytes/Nutrition: Routine I&O's    BMP within acceptable range on 2/22 8. Acute blood loss anemia.    Hb 8.2 on 2/22   Cont to monitor 9. Chronic constipation. Continue Movantik, qod miralax, dulcolax suppository tonight    Hx of IBS  10.GERD.Prilosec              -prn maalox 11. Tachycardia    Monitor in accordance with pain and increasing activity   Improving  12. Hypoalbuminemia   Supplement initiated on 2/20  LOS (Days) 3 A FACE TO FACE EVALUATION WAS PERFORMED  Kylee Nardozzi Lorie Phenix 06/05/2017 8:03 AM

## 2017-06-05 NOTE — Progress Notes (Signed)
[place] for warfarin Indication: VTE prophylaxis status post TKA   Allergies  Allergen Reactions  . Adhesive [Tape] Other (See Comments)    Blisters; "gets puffy and red" Liquid Bandaids    Patient Measurements: Height: 5\' 5"  (165.1 cm) Weight: 158 lb 11.7 oz (72 kg) IBW/kg (Calculated) : 57  Vital Signs: Temp: 98.1 F (36.7 C) (02/22 0343) Temp Source: Oral (02/22 0343) BP: 151/68 (02/22 0343) Pulse Rate: 94 (02/22 0343)  Labs: Recent Labs    06/03/17 0543 06/04/17 0512 06/05/17 0617  HGB 7.8*  --  8.2*  HCT 23.8*  --  25.2*  PLT 340  --  445*  LABPROT 19.9* 21.8* 20.9*  INR 1.71 1.92 1.82  CREATININE 0.66  --  0.62    Estimated Creatinine Clearance: 65.1 mL/min (by C-G formula based on SCr of 0.62 mg/dL).   Medical History: Past Medical History:  Diagnosis Date  . Anxiety   . Carpal tunnel syndrome on both sides   . Chronic back pain    herniated disc,stenosis,radiculopathy,spondylolisthesis  . Chronic neck pain   . Constipation, chronic   . Degenerative spinal arthritis    cervical and lumbar  . Depression   . Eczema   . GERD (gastroesophageal reflux disease)   . Hemorrhoids   . Hiatal hernia   . History of bronchitis    "I've had it a number of times"  . IBS (irritable bowel syndrome)    lactose intolerant  . Osteoarthritis    bilateral knees and shoulders,  little fingers  . TMJ (dislocation of temporomandibular joint)   . Wears glasses     Assessment: 79 YOF with history of BL osteoarthritis of the knees. Presented to [facility] on 2/13 for BL TKA. Patient not on anticoagulation prior to admission. Coumadin ordered post op for VTE prophylaxis. Ortho team plans to continue with Coumadin for 4 weeks (from 05/28/17), then transition to aspirin 81mg  daily.  INR = 1.82   Goal of Therapy:  INR 2-3  Plan:  Warfarin 6 mg po x 1 Daily INR Continue enoxaparin until INR is therapeutic Monitor for signs  and symptoms of bleeding  Thank you Anette Guarneri, PharmD [phone] 06/05/2017 11:29 AM

## 2017-06-05 NOTE — Progress Notes (Signed)
Physical Therapy Session Note  Patient Details  Name: Sherry [name] MRN: [id number] Date of Birth: 03-08-1946  Today's Date: 06/05/2017 PT Individual Time: 4037-0964 PT Individual Time Calculation (min): 55 min   Short Term Goals: Week 1:  PT Short Term Goal 1 (Week 1): Pt will ambulate x 100 ft with RW and min assist PT Short Term Goal 2 (Week 1): Pt will perform sit<>stand with min assist PT Short Term Goal 3 (Week 1): Pt will perform x 10 straight leg raises bilaterally  Skilled Therapeutic Interventions/Progress Updates:   Pt in recliner and agreeable to therapy, c/o bilateral knee pain but did not quantify. Additionally c/o discomfort and anterior L hip at ASIS and into quad musculature. Suspect soft tissue in nature as it is most painful w/ active hip flexion, educated pt on muscle strains and discomfort w/ compensations since her surgery, pain resolves w/ prolonged rest. Pt transferred to w/c via stand pivot w/ RW, min guard. Pt self-propelled w/c to/from gym to work on general strengthening and endurance. Practiced negotiating 4 steps w/ bilateral handrails w/ mod assist overall using modified pattern 2/2 legs still in braces, step-to pattern. Discussed progressing to R handrail only in future trials as this is what pt has at home. Returned to room via w/c and transferred to supine w/ min assist for LE management. Spent remainder of session adjusting pt on CPM machines for comfort, tolerance to machine use, and for appropriate fit. Added 10 deg as per order for use. Order states using machines for 4 hours, however pt states she only used them for 2 hours at [employer]. This PT stated she would follow up in morning to see if pt tolerated 4 hours of continuous use this evening, will come up w/ new CPM plan if 4 hours needs to be broken up throughout the day for pt tolerance. Ended session resting comfortably in supine, call bell within reach and all needs met. RN made aware of status on  CPMs.   Therapy Documentation Precautions:  Precautions Precautions: Knee, Fall Required Braces or Orthoses: Knee Immobilizer - Left, Knee Immobilizer - Right Knee Immobilizer - Right: Discontinue once straight leg raise with < 10 degree lag Knee Immobilizer - Left: Discontinue once straight leg raise with < 10 degree lag Restrictions Weight Bearing Restrictions: No RLE Weight Bearing: Weight bearing as tolerated LLE Weight Bearing: Weight bearing as tolerated Vital Signs: Therapy Vitals Temp: 98.3 F (36.8 C) Temp Source: Oral Pulse Rate: 91 Resp: 16 BP: (!) 142/67 Patient Position (if appropriate): Sitting Oxygen Therapy SpO2: 98 % O2 Device: Not Delivered  See Function Navigator for Current Functional Status.   Therapy/Group: Individual Therapy  Oriel Ojo K Arnette 06/05/2017, 5:14 PM

## 2017-06-05 NOTE — Progress Notes (Signed)
Physical Therapy Session Note  Patient Details  Name: Sherry [name] MRN: [id number] Date of Birth: 12/01/1945  Today's Date: 06/05/2017 PT Individual Time: 1115-1208 PT Individual Time Calculation (min): 53 min   Short Term Goals: Week 1:  PT Short Term Goal 1 (Week 1): Pt will ambulate x 100 ft with RW and min assist PT Short Term Goal 2 (Week 1): Pt will perform sit<>stand with min assist PT Short Term Goal 3 (Week 1): Pt will perform x 10 straight leg raises bilaterally  Skilled Therapeutic Interventions/Progress Updates:    Pt seated in recliner, agreeable to participate in therapy session. Pt reports 5/10 pain in B knees at rest, just received pain medication from nursing. Pt is dependent to don B knee immoblizers. Sit to stand min assist to RW, stand pivot transfer recliner to w/c with min assist and RW. Ambulation x 86 ft, x 50 ft with RW and min assist for balance, verbal cues for upright posture as pt tends to ambulate with hips flexed. Stand pivot transfer w/c to bed with min assist and RW. Sit to supine mod assist for BLE management. Pt left supine in bed with needs in reach, bed alarm in place, daughter present.  Therapy Documentation Precautions:  Precautions Precautions: Knee, Fall Required Braces or Orthoses: Knee Immobilizer - Left, Knee Immobilizer - Right Knee Immobilizer - Right: Discontinue once straight leg raise with < 10 degree lag Knee Immobilizer - Left: Discontinue once straight leg raise with < 10 degree lag Restrictions Weight Bearing Restrictions: No RLE Weight Bearing: Weight bearing as tolerated LLE Weight Bearing: Weight bearing as tolerated  See Function Navigator for Current Functional Status.   Therapy/Group: Individual Therapy  Excell Seltzer, PT, DPT  06/05/2017, 3:38 PM

## 2017-06-05 NOTE — Progress Notes (Signed)
Occupational Therapy Session Note  Patient Details  Name: Sherry [name] MRN: [id number] Date of Birth: [birth year]  Today's Date: 06/05/2017 OT Individual Time: 1416-1500 OT Individual Time Calculation (min): 44 min    Short Term Goals: Week 1:  OT Short Term Goal 1 (Week 1): Pt will complete toilet transfers with supervision to elevated 3:1 with RW.  OT Short Term Goal 2 (Week 1): Pt will complete walk-in shower transfers with supervision using RW and tub bench. OT Short Term Goal 3 (Week 1): Pt will perform LB bathing sit to stand with min assist.  OT Short Term Goal 4 (Week 1): Pt will perform LB dressing sit to stand with supervision and AE PRN.  Skilled Therapeutic Interventions/Progress Updates:    Pt completed simulated walk-in shower transfers during session using simulated shower edge with overall min assist.  She was able to step backwards over the edge of the shower and then forwards when getting out.  She propelled her wheelchair to and from the therapy session with supervision and also completed transfer to the EOB from supine with mod assist for moving both LEs.  Finished session with transfer to the bedside recliner with min assist, call button and phone in reach.    Therapy Documentation Precautions:  Precautions Precautions: Knee, Fall Required Braces or Orthoses: Knee Immobilizer - Left, Knee Immobilizer - Right Knee Immobilizer - Right: Discontinue once straight leg raise with < 10 degree lag Knee Immobilizer - Left: Discontinue once straight leg raise with < 10 degree lag Restrictions Weight Bearing Restrictions: No RLE Weight Bearing: Weight bearing as tolerated LLE Weight Bearing: Weight bearing as tolerated  Pain: Pt reports pain at 5/10 in bilateral knees  See Function Navigator for Current Functional Status.   Therapy/Group: Individual Therapy  Deeanna Beightol OTR/L 06/05/2017, 4:10 PM

## 2017-06-06 ENCOUNTER — Inpatient Hospital Stay (HOSPITAL_COMMUNITY): Payer: Medicare Other | Admitting: Occupational Therapy

## 2017-06-06 ENCOUNTER — Inpatient Hospital Stay (HOSPITAL_COMMUNITY): Payer: Medicare Other | Admitting: Physical Therapy

## 2017-06-06 LAB — PROTIME-INR
INR: 2
Prothrombin Time: 22.5 seconds — ABNORMAL HIGH (ref 11.4–15.2)

## 2017-06-06 MED ORDER — WARFARIN SODIUM 5 MG PO TABS
5.0000 mg | ORAL_TABLET | Freq: Once | ORAL | Status: AC
  Administered 2017-06-06: 5 mg via ORAL
  Filled 2017-06-06: qty 1

## 2017-06-06 NOTE — Progress Notes (Signed)
Physical Therapy Session Note  Patient Details  Name: Sherry [name] MRN: [id number] Date of Birth: [birth year]  Today's Date: 06/06/2017 PT Individual Time: 0945-1100 PT Individual Time Calculation (min): 75 min   Short Term Goals: Week 1:  PT Short Term Goal 1 (Week 1): Pt will ambulate x 100 ft with RW and min assist PT Short Term Goal 2 (Week 1): Pt will perform sit<>stand with min assist PT Short Term Goal 3 (Week 1): Pt will perform x 10 straight leg raises bilaterally  Skilled Therapeutic Interventions/Progress Updates:   Pt in recliner and agreeable to therapy, states knee pain is 5/10 at rest. Stand pivot transfer w/ supervision  to w/c w/ RW. Pt self-propelled w/c to gym to work on general strengthening and endurance. Transferred to/from mat via same technique described above. Performed BLE strengthening and ROM exercises as detailed below. Static stretch into knee extension w/o towel support to work on increasing knee ROM, pain gets up to 7-8/10 w/ this static stretch, only able to tolerate 30 sec at a time. Pt c/o dizziness when returning to sitting at edge of mat from supine, vitals WNL, dizziness improved w/ rest and water. Performed NuStep 5 min @ L3 in shortened ROM to work on LE strengthening and ROM. Returned to room total assist in w/c for time management and ended session in supine w/ ice applied to both knees, call bell within reach and all needs met. Pt refused therapist to set up CPMs as she had a few hours break, offered to only put on 1 leg and the other leg can be done after therapies this afternoon. Pt states she feels she doesn't need the CPMs anymore and her doctor stated this morning he would contact the surgeon regarding their use. Will follow up later in afternoon regarding CPM use.   BLE strengthening exercises: -assisted SAQs 2x10 -ankle pumps 2x10  -quad sets 2x10  -assisted supine heel slides 2x10 -partial long arc quads 2x10 -assisted SLR from seated  position 2x10 -heel slides in seated 2x10 -standing hip abduction 2x10 -standing hip flexion 2x10 -partial knee bends 2x10  Therapy Documentation Precautions:  Precautions Precautions: Knee, Fall Required Braces or Orthoses: Knee Immobilizer - Left, Knee Immobilizer - Right Knee Immobilizer - Right: Discontinue once straight leg raise with < 10 degree lag Knee Immobilizer - Left: Discontinue once straight leg raise with < 10 degree lag Restrictions Weight Bearing Restrictions: No RLE Weight Bearing: Weight bearing as tolerated LLE Weight Bearing: Weight bearing as tolerated  See Function Navigator for Current Functional Status.   Therapy/Group: Individual Therapy  Charlotta Lapaglia K Arnette 06/06/2017, 11:02 AM

## 2017-06-06 NOTE — Progress Notes (Signed)
Patient refused the remaining 2 hours of CPM use, she stated she wanted to discuss it with the doctor in the morning.

## 2017-06-06 NOTE — Progress Notes (Signed)
[facility] PHYSICAL MEDICINE & REHABILITATION     PROGRESS NOTE  Subjective/Complaints:  Patient seen lying in bed this morning.  He she slept without difficulty.  She does admit to significant amount of bilateral knee pain when she gets up to move.  Pain is controlled at rest.  She denies any other symptoms such as chest pain, shortness of breath.  She does admit to having irritable bowel syndrome and takes occasional medications for that.  Objective: Vital Signs: Blood pressure 127/90, pulse (!) 101, temperature 98.4 F (36.9 C), temperature source Oral, resp. rate 18, height 5\' 5"  (1.651 m), weight 158 lb 11.7 oz (72 kg), last menstrual period 04/14/1997, SpO2 96 %.  Physical Exam:  BP 127/90 (BP Location: Right Arm)   Pulse (!) 101   Temp 98.4 F (36.9 C) (Oral)   Resp 18   Ht 5\' 5"  (1.651 m)   Wt 158 lb 11.7 oz (72 kg)   LMP 04/14/1997   SpO2 96%   BMI 26.41 kg/m   Pleasant female in no acute distress.  She appears well. HEENT exam atraumatic, normocephalic, extra ocular muscles are intact Cardiac exam S1 and S2 are regular without gallop. Chest clear to auscultation without any increased work of breathing. Abdominal exam overweight, active bowel sounds, soft. Extremities without significant edema Neurologic exam she is alert.   Assessment/Plan: 1. 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 Decreased functional mobility secondary to end-stage osteoarthritis bilateral knees. Status post bilateral TKA 05/27/2017. Weightbearing as tolerated.    Continue CIR  2. DVT Prophylaxis/Anticoagulation: Coumadin for DVT prophylaxis. Plan is for Coumadin 4 weeks then aspirin 81 mg daily     Lab Results  Component Value Date   INR 2.00 06/06/2017   INR 1.82 06/05/2017   INR 1.92 06/04/2017    3. Pain Management: We will continue current medications.  Pain is variable based on activity.   She is frustrated with lack of mobility. 4. Mood: Provide emotional support, she is frustrated with lack of mobility. 5. Neuropsych: This patient is capable of making decisions on her own behalf.  6. Skin/Wound Care: Routine skin checks  7. Fluids/Electrolytes/Nutrition: Routine I&O's    BMP within acceptable range on 2/22 8. Acute blood loss anemia.    Hb 8.2 on 2/22   Cont to monitor 9. Chronic constipation.  Resolved.   Hx of IBS  10.GERD.Prilosec              -prn maalox 11. Tachycardia 91-101   Likely related to recent surgery and anemia. 12. Hypoalbuminemia   Supplement initiated on 2/20  LOS (Days) 4 A FACE TO FACE EVALUATION WAS PERFORMED  Bruce H Swords 06/06/2017 8:17 AM

## 2017-06-06 NOTE — Progress Notes (Signed)
Occupational Therapy Session Note  Patient Details  Name: Sherry [name] MRN: [id number] Date of Birth: December 15, 1945  Today's Date: 06/06/2017 OT Individual Time:  - 750-910 Total minutes = 80      Short Term Goals: Week 1:  OT Short Term Goal 1 (Week 1): Pt will complete toilet transfers with supervision to elevated 3:1 with RW.  OT Short Term Goal 2 (Week 1): Pt will complete walk-in shower transfers with supervision using RW and tub bench. OT Short Term Goal 3 (Week 1): Pt will perform LB bathing sit to stand with min assist.  OT Short Term Goal 4 (Week 1): Pt will perform LB dressing sit to stand with supervision and AE PRN.  Skilled Therapeutic Interventions/Progress Updates: Patient focus today as follows:      Donning bilateral KIs with max assist;   Supine to edge of bed transfer=min A;    Sit to stand at walker = extra time and cues to stand up straight rather than with front abdominal flexion;   Shower transfer (stepping over imaginary approx 1" threshold) close S;     UB bathing and dressing = setup;    Lower body bathing and dressing= Mod A...      *  Patient will benefit from more opportunitiies to flex forward from to wash feet, donsocks and access low envirnoment items,etc)  Toilettransfer= close S and cues(patient saton toilet to don lower body clothing)       Toilet to recliner transfer via standard walker= closeS  Patientleft with callbell and phonewithin reach     Therapy Documentation Precautions:  Precautions Precautions: Knee, Fall Required Braces or Orthoses: Knee Immobilizer - Left, Knee Immobilizer - Right Knee Immobilizer - Right: Discontinue once straight leg raise with < 10 degree lag Knee Immobilizer - Left: Discontinue once straight leg raise with < 10 degree lag Restrictions Weight Bearing Restrictions: No RLE Weight Bearing: Weight bearing as tolerated LLE Weight Bearing: Weight bearing as tolerated  Pain:"none when I am still and then It goes  straight up fast once I start moving."  See Function Navigator for Current Functional Status.   Therapy/Group: Individual Therapy  Alfredia Ferguson [facility] 06/06/2017, 2:39 PM

## 2017-06-06 NOTE — Progress Notes (Signed)
Occupational Therapy Session Note  Patient Details  Name: Sherry [name] MRN: [id number] Date of Birth: Mar 14, 1946  Today's Date: 06/06/2017 OT Individual Time: 1191-4782 OT Individual Time Calculation (min): 62 min    Short Term Goals: Week 1:  OT Short Term Goal 1 (Week 1): Pt will complete toilet transfers with supervision to elevated 3:1 with RW.  OT Short Term Goal 2 (Week 1): Pt will complete walk-in shower transfers with supervision using RW and tub bench. OT Short Term Goal 3 (Week 1): Pt will perform LB bathing sit to stand with min assist.  OT Short Term Goal 4 (Week 1): Pt will perform LB dressing sit to stand with supervision and AE PRN.  Skilled Therapeutic Interventions/Progress Updates:    Pt transferred from supine to sit EOB with min assist.  She then completed stand pivot transfer to the wheelchair with min assist.  Therapist took her to the dayroom to work on sit to stand transitions and standing balance while engaged in Wii activity.  She was able to perform multiple sit to stand transitions with min guard assist.  Once standing she was able to maintain her balance with min assist.  Still with flexed trunk in standing however and limited standing endurance for less than 2 mins secondary to pain.  Finished session with transport back to the room and stand pivot transfer to the recliner.  Call button and phone in reach with ice packs placed on bilateral knees.  Pt's daughter also present at end of session.    Therapy Documentation Precautions:  Precautions Precautions: Knee, Fall Required Braces or Orthoses: Knee Immobilizer - Left, Knee Immobilizer - Right Knee Immobilizer - Right: Discontinue once straight leg raise with < 10 degree lag Knee Immobilizer - Left: Discontinue once straight leg raise with < 10 degree lag Restrictions Weight Bearing Restrictions: No RLE Weight Bearing: Weight bearing as tolerated LLE Weight Bearing: Weight bearing as  tolerated  Pain: Pain Assessment Pain Assessment: 0-10 Pain Score: 5  Pain Type: Surgical pain Pain Location: Knee Pain Orientation: Right;Left Pain Descriptors / Indicators: Aching Pain Onset: Gradual Pain Intervention(s): Repositioned;Cold applied ADL: See Function Navigator for Current Functional Status.   Therapy/Group: Individual Therapy  Felipe Cabell OTR/L 06/06/2017, 5:31 PM

## 2017-06-06 NOTE — Progress Notes (Signed)
[place] for warfarin Indication: VTE prophylaxis status post TKA   Allergies  Allergen Reactions  . Adhesive [Tape] Other (See Comments)    Blisters; "gets puffy and red" Liquid Bandaids    Patient Measurements: Height: 5\' 5"  (165.1 cm) Weight: 158 lb 11.7 oz (72 kg) IBW/kg (Calculated) : 57  Vital Signs: Temp: 98.4 F (36.9 C) (02/23 0316) Temp Source: Oral (02/23 0316) BP: 127/90 (02/23 0316) Pulse Rate: 101 (02/23 0316)  Labs: Recent Labs    06/04/17 0512 06/05/17 0617 06/06/17 0714  HGB  --  8.2*  --   HCT  --  25.2*  --   PLT  --  445*  --   LABPROT 21.8* 20.9* 22.5*  INR 1.92 1.82 2.00  CREATININE  --  0.62  --     Estimated Creatinine Clearance: 65.1 mL/min (by C-G formula based on SCr of 0.62 mg/dL).   Medical History: Past Medical History:  Diagnosis Date  . Anxiety   . Carpal tunnel syndrome on both sides   . Chronic back pain    herniated disc,stenosis,radiculopathy,spondylolisthesis  . Chronic neck pain   . Constipation, chronic   . Degenerative spinal arthritis    cervical and lumbar  . Depression   . Eczema   . GERD (gastroesophageal reflux disease)   . Hemorrhoids   . Hiatal hernia   . History of bronchitis    "I've had it a number of times"  . IBS (irritable bowel syndrome)    lactose intolerant  . Osteoarthritis    bilateral knees and shoulders,  little fingers  . TMJ (dislocation of temporomandibular joint)   . Wears glasses    Assessment: 14 YOF with history of BL osteoarthritis of the knees. Presented [facility] on [facility]/13 for BL TKA. Patient not on anticoagulation prior to admission. Coumadin ordered post op for VTE prophylaxis. Ortho team plans to continue with Coumadin for 4 weeks (from 05/28/17), then transition to aspirin 81mg  daily.  INR today = 2.0  Goal of Therapy:   INR 2-3  Monitor for platelets: Yes  Plan:  Warfarin 5 mg po x 1 Daily INR Continue enoxaparin until INR is  therapeutic Monitor for signs and symptoms of bleeding  Thank you, Slayden Mennenga L. Kyung Rudd, PharmD, [place] PGY1 Pharmacy Resident Pager: [phone]

## 2017-06-07 ENCOUNTER — Inpatient Hospital Stay (HOSPITAL_COMMUNITY): Payer: Medicare Other

## 2017-06-07 LAB — CBC
HCT: 26.9 % — ABNORMAL LOW (ref 36.0–46.0)
Hemoglobin: 8.6 g/dL — ABNORMAL LOW (ref 12.0–15.0)
MCH: 31.2 pg (ref 26.0–34.0)
MCHC: 32 g/dL (ref 30.0–36.0)
MCV: 97.5 fL (ref 78.0–100.0)
Platelets: 513 10*3/uL — ABNORMAL HIGH (ref 150–400)
RBC: 2.76 MIL/uL — ABNORMAL LOW (ref 3.87–5.11)
RDW: 14.8 % (ref 11.5–15.5)
WBC: 6.5 10*3/uL (ref 4.0–10.5)

## 2017-06-07 LAB — PROTIME-INR
INR: 1.99
Prothrombin Time: 22.4 seconds — ABNORMAL HIGH (ref 11.4–15.2)

## 2017-06-07 MED ORDER — WARFARIN SODIUM 3 MG PO TABS
6.0000 mg | ORAL_TABLET | Freq: Once | ORAL | Status: AC
  Administered 2017-06-07: 6 mg via ORAL
  Filled 2017-06-07: qty 2

## 2017-06-07 NOTE — Discharge Instructions (Addendum)
Inpatient Rehab Discharge Instructions  AARIONNA GERMER Discharge date and time: No discharge date for patient encounter.   Activities/Precautions/ Functional Status: Activity: activity as tolerated Diet: regular diet Wound Care: keep wound clean and dry Functional status:  ___ No restrictions     ___ Walk up steps independently ___ 24/7 supervision/assistance   ___ Walk up steps with assistance ___ Intermittent supervision/assistance  ___ Bathe/dress independently ___ Walk with walker     _x__ Bathe/dress with assistance ___ Walk Independently    ___ Shower independently ___ Walk with assistance    ___ Shower with assistance ___ No alcohol     ___ Return to work/school ________  Special Instructions: Home health nurse to check INR on 06/16/2017 results to [facility] [phone]   Continue Coumadin until 06/24/2017 and then begin aspirin 81 mg daily   COMMUNITY REFERRALS UPON DISCHARGE:    Home Health:   [facility]   [place]   Date of last service:06/13/2017  Medical Equipment/Items Ordered:WHEELCHAIR, Vassie Moselle, 3 IN 1   Agency/Supplier:[facility] [facility]   [phone]    My questions have been answered and I understand these instructions. I will adhere to these goals and the provided educational materials after my discharge from the hospital.  Patient/Caregiver Signature _______________________________ Date __________  Clinician Signature _______________________________________ Date __________  Please bring this form and your medication list with you to all your follow-up doctor's appointments. Information on my medicine - Coumadin   (Warfarin)  This medication education was reviewed with me or my healthcare representative as part of my discharge preparation.    Why was Coumadin prescribed for you? Coumadin was prescribed for you because you have a blood clot or a medical condition that can cause an increased risk of forming  blood clots. Blood clots can cause serious health problems by blocking the flow of blood to the heart, lung, or brain. Coumadin can prevent harmful blood clots from forming. As a reminder your indication for Coumadin is:   Blood Clot Prevention After Orthopedic Surgery   What test will check on my response to Coumadin? While on Coumadin (warfarin) you will need to have an INR test regularly to ensure that your dose is keeping you in the desired range. The INR (international normalized ratio) number is calculated from the result of the laboratory test called prothrombin time (PT).  If an INR APPOINTMENT HAS NOT ALREADY BEEN MADE FOR YOU please schedule an appointment to have this lab work done by your health care provider within 7 days. Your INR goal is usually a number between:  2 to 3 or your provider may give you a more narrow range like 2-2.5.  Ask your health care provider during an office visit what your goal INR is.  What  do you need to  know  About  COUMADIN? Take Coumadin (warfarin) exactly as prescribed by your healthcare provider about the same time each day.  DO NOT stop taking without talking to the doctor who prescribed the medication.  Stopping without other blood clot prevention medication to take the place of Coumadin may increase your risk of developing a new clot or stroke.  Get refills before you run out.  What do you do if you miss a dose? If you miss a dose, take it as soon as you remember on the same day then continue your regularly scheduled regimen the next day.  Do not take two doses of Coumadin at the same time.  Important Safety Information A possible side effect of Coumadin (Warfarin) is an increased risk of bleeding. You should call your healthcare provider right away if you experience any of the following: ? Bleeding from an injury or your nose that does not stop. ? Unusual colored urine (red or dark brown) or unusual colored stools (red or black). ? Unusual bruising  for unknown reasons. ? A serious fall or if you hit your head (even if there is no bleeding).  Some foods or medicines interact with Coumadin (warfarin) and might alter your response to warfarin. To help avoid this: ? Eat a balanced diet, maintaining a consistent amount of Vitamin K. ? Notify your provider about major diet changes you plan to make. ? Avoid alcohol or limit your intake to 1 drink for women and 2 drinks for men per day. (1 drink is 5 oz. wine, 12 oz. beer, or 1.5 oz. liquor.)  Make sure that ANY health care provider who prescribes medication for you knows that you are taking Coumadin (warfarin).  Also make sure the healthcare provider who is monitoring your Coumadin knows when you have started a new medication including herbals and non-prescription products.  Coumadin (Warfarin)  Major Drug Interactions  Increased Warfarin Effect Decreased Warfarin Effect  Alcohol (large quantities) Antibiotics (esp. Septra/Bactrim, Flagyl, Cipro) Amiodarone (Cordarone) Aspirin (ASA) Cimetidine (Tagamet) Megestrol (Megace) NSAIDs (ibuprofen, naproxen, etc.) Piroxicam (Feldene) Propafenone (Rythmol SR) Propranolol (Inderal) Isoniazid (INH) Posaconazole (Noxafil) Barbiturates (Phenobarbital) Carbamazepine (Tegretol) Chlordiazepoxide (Librium) Cholestyramine (Questran) Griseofulvin Oral Contraceptives Rifampin Sucralfate (Carafate) Vitamin K   Coumadin (Warfarin) Major Herbal Interactions  Increased Warfarin Effect Decreased Warfarin Effect  Garlic Ginseng Ginkgo biloba Coenzyme Q10 Green tea St. Johns wort    Coumadin (Warfarin) FOOD Interactions  Eat a consistent number of servings per week of foods HIGH in Vitamin K (1 serving =  cup)  Collards (cooked, or boiled & drained) Kale (cooked, or boiled & drained) Mustard greens (cooked, or boiled & drained) Parsley *serving size only =  cup Spinach (cooked, or boiled & drained) Swiss chard (cooked, or boiled &  drained) Turnip greens (cooked, or boiled & drained)  Eat a consistent number of servings per week of foods MEDIUM-HIGH in Vitamin K (1 serving = 1 cup)  Asparagus (cooked, or boiled & drained) Broccoli (cooked, boiled & drained, or raw & chopped) Brussel sprouts (cooked, or boiled & drained) *serving size only =  cup Lettuce, raw (green leaf, endive, romaine) Spinach, raw Turnip greens, raw & chopped   These websites have more information on Coumadin (warfarin):  FailFactory.se; VeganReport.com.au;

## 2017-06-07 NOTE — Progress Notes (Signed)
Per Dr. Wynelle Link, leg immobilizers can be discontinued. Verbal order per Mechele Claude PA [facility] 06/07/17 1646

## 2017-06-07 NOTE — Progress Notes (Signed)
Message left with Dr. Wynelle Link office regarding immobilizers

## 2017-06-07 NOTE — Progress Notes (Signed)
[place] PHYSICAL MEDICINE & REHABILITATION     PROGRESS NOTE  Subjective/Complaints:  Patient is lying in bed this morning.  She slept well.  She still has some discomfort when mobilizing.  Pain is at the knees.  She has questions about whether she is progressing appropriately.  Objective: Vital Signs: Blood pressure 128/69, pulse 90, temperature 97.8 F (36.6 C), temperature source Oral, resp. rate 16, height 5\' 5"  (1.651 m), weight 158 lb 11.7 oz (72 kg), last menstrual period 04/14/1997, SpO2 95 %.  Physical Exam:  BP 128/69 (BP Location: Left Arm)   Pulse 90   Temp 97.8 F (36.6 C) (Oral)   Resp 16   Ht 5\' 5"  (1.651 m)   Wt 158 lb 11.7 oz (72 kg)   LMP 04/14/1997   SpO2 95%   BMI 26.41 kg/m    Pleasant female in no acute distress.  She appears well. HEENT exam atraumatic, normocephalic, extraocular muscles are intact Cardiac exam S1-S2 are regular without gallop Chest is clear to auscultation without any increased work of breathing Abdominal exam active bowel sounds, soft, nontender. Extremities without significant edema Neurologic exam she is alert and oriented.  Assessment/Plan: 1. 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 Decreased functional mobility secondary to end-stage osteoarthritis bilateral knees. Status post bilateral TKA 05/27/2017. Weightbearing as tolerated.    Continue CIR She has questions about knee mobilization.  I will ask nursing to call for specific orders from orthopedic group.  2. DVT Prophylaxis/Anticoagulation: Coumadin for DVT prophylaxis. Plan is for Coumadin 4 weeks then aspirin 81 mg daily     Lab Results  Component Value Date   INR 1.99 06/07/2017   INR 2.00 06/06/2017   INR 1.82 06/05/2017    3. Pain Management: We will continue current medications.  Pain is variable based on activity.  4. Mood: Provide emotional support, 5.  Neuropsych: This patient is capable of making decisions on her own behalf.  6. Skin/Wound Care: Routine skin checks  7. Fluids/Electrolytes/Nutrition: Routine I&O's    BMP within acceptable range on 2/22 8. Acute blood loss anemia.    Lab Results  Component Value Date   HGB 8.6 (L) 06/07/2017  Stable.  9. Chronic constipation.  Resolved.   Hx of IBS  10.GERD.Prilosec              -prn maalox 11. Tachycardia 91-101   Likely related to recent surgery and anemia. 12. Hypoalbuminemia   Supplement initiated on 2/20  LOS (Days) 5 A FACE TO FACE EVALUATION WAS PERFORMED  Bracha Frankowski H Vearl Aitken 06/07/2017 9:14 AM

## 2017-06-07 NOTE — Progress Notes (Signed)
Physical Therapy Session Note  Patient Details  Name: Sherry [name] MRN: [id number] Date of Birth: [birth year]  Today's Date: 06/07/2017 PT Individual Time: 1616-1700 PT Individual Time Calculation (min): 44 min   Short Term Goals: Week 1:  PT Short Term Goal 1 (Week 1): Pt will ambulate x 100 ft with RW and min assist PT Short Term Goal 2 (Week 1): Pt will perform sit<>stand with min assist PT Short Term Goal 3 (Week 1): Pt will perform x 10 straight leg raises bilaterally  Skilled Therapeutic Interventions/Progress Updates:    Pt supine in bed upon PT arrival, agreeable to therapy tx and reports pain 8/10 during mobility. Pt transferred from supine>sitting EOB with supervision and use of leg lifters. Pt transferred from sit<>stand with supervision using RW and from elevated bed. Pt performed stand pivot transfer from bed>w/c with RW and min assit. Pt propelled w/c from room>gym with B UEs and with supervision. Pt performed 2 x 10 heel slides, 2 x 10 LAQ and therapist performed 2 x 30 sec stretch at end range for both knee flexion and extension. Therapist performed gentle knee mobilizations for pain relief. Pt transported back to room and transferred to bed with supervision using RW. Pt left supine in bed with needs in reach, educated on maintaining knee extension while in bed rather than knee flexion, also educated pt on performing quad sets.   Therapy Documentation Precautions:  Precautions Precautions: Knee, Fall Required Braces or Orthoses: Knee Immobilizer - Left, Knee Immobilizer - Right Knee Immobilizer - Right: Discontinue once straight leg raise with < 10 degree lag Knee Immobilizer - Left: Discontinue once straight leg raise with < 10 degree lag Restrictions Weight Bearing Restrictions: No RLE Weight Bearing: Weight bearing as tolerated LLE Weight Bearing: Weight bearing as tolerated   See Function Navigator for Current Functional Status.   Therapy/Group: Individual  Therapy  Netta Corrigan, PT, DPT 06/07/2017, 12:05 PM

## 2017-06-07 NOTE — Progress Notes (Addendum)
[facility] for Coumadin Indication: VTE prophylaxis s/p TKA   Allergies  Allergen Reactions  . Adhesive [Tape] Other (See Comments)    Blisters; "gets puffy and red" Liquid Bandaids    Patient Measurements: Height: 5\' 5"  (165.1 cm) Weight: 158 lb 11.7 oz (72 kg) IBW/kg (Calculated) : 57  Vital Signs: Temp: 97.8 F (36.6 C) (02/24 0445) Temp Source: Oral (02/24 0445) BP: 128/69 (02/24 0445) Pulse Rate: 90 (02/24 0445)  Labs: Recent Labs    06/05/17 0617 06/06/17 0714 06/07/17 0558  HGB 8.2*  --  8.6*  HCT 25.2*  --  26.9*  PLT 445*  --  513*  LABPROT 20.9* 22.5* 22.4*  INR 1.82 2.00 1.99  CREATININE 0.62  --   --     Estimated Creatinine Clearance: 65.1 mL/min (by C-G formula based on SCr of 0.62 mg/dL).   Medical History: Past Medical History:  Diagnosis Date  . Anxiety   . Carpal tunnel syndrome on both sides   . Chronic back pain    herniated disc,stenosis,radiculopathy,spondylolisthesis  . Chronic neck pain   . Constipation, chronic   . Degenerative spinal arthritis    cervical and lumbar  . Depression   . Eczema   . GERD (gastroesophageal reflux disease)   . Hemorrhoids   . Hiatal hernia   . History of bronchitis    "I've had it a number of times"  . IBS (irritable bowel syndrome)    lactose intolerant  . Osteoarthritis    bilateral knees and shoulders,  little fingers  . TMJ (dislocation of temporomandibular joint)   . Wears glasses    Assessment: 25 YOF with history of BL osteoarthritis of the knees. Presented to [facility] on 2/13 for BL TKA. Patient not on anticoagulation prior to admission. Coumadin ordered post op for VTE prophylaxis. Ortho team plans to continue with Coumadin for 4 weeks (from 05/28/17), then transition to aspirin 81mg  daily.  INR today = 1.99 H/H low but stable, PLT wnl/high No bleeding noted  Goal of Therapy:   INR 2-3  Monitor for platelets: Yes  Plan:  Coumadin 6 mg PO x  1 tonight Lovenox 30 mg SQ q12h Daily INR Continue Lovenox until INR is therapeutic for 24 hours Monitor for signs and symptoms of bleeding   Thank you, Debe Anfinson L. Kyung Rudd, PharmD, [facility] PGY1 Pharmacy Resident Pager: [phone]

## 2017-06-08 ENCOUNTER — Inpatient Hospital Stay (HOSPITAL_COMMUNITY): Payer: Medicare Other | Admitting: Occupational Therapy

## 2017-06-08 ENCOUNTER — Inpatient Hospital Stay (HOSPITAL_COMMUNITY): Payer: Medicare Other

## 2017-06-08 DIAGNOSIS — G479 Sleep disorder, unspecified: Secondary | ICD-10-CM

## 2017-06-08 DIAGNOSIS — F411 Generalized anxiety disorder: Secondary | ICD-10-CM

## 2017-06-08 LAB — CBC
HCT: 28 % — ABNORMAL LOW (ref 36.0–46.0)
Hemoglobin: 8.8 g/dL — ABNORMAL LOW (ref 12.0–15.0)
MCH: 30.6 pg (ref 26.0–34.0)
MCHC: 31.4 g/dL (ref 30.0–36.0)
MCV: 97.2 fL (ref 78.0–100.0)
Platelets: 573 10*3/uL — ABNORMAL HIGH (ref 150–400)
RBC: 2.88 MIL/uL — ABNORMAL LOW (ref 3.87–5.11)
RDW: 15 % (ref 11.5–15.5)
WBC: 8.8 10*3/uL (ref 4.0–10.5)

## 2017-06-08 LAB — PROTIME-INR
INR: 2.2
Prothrombin Time: 24.2 seconds — ABNORMAL HIGH (ref 11.4–15.2)

## 2017-06-08 MED ORDER — WARFARIN SODIUM 5 MG PO TABS
5.0000 mg | ORAL_TABLET | Freq: Every day | ORAL | Status: DC
  Administered 2017-06-08 – 2017-06-12 (×5): 5 mg via ORAL
  Filled 2017-06-08 (×5): qty 1

## 2017-06-08 MED ORDER — TRAZODONE HCL 50 MG PO TABS
50.0000 mg | ORAL_TABLET | Freq: Every day | ORAL | Status: DC
  Administered 2017-06-08 – 2017-06-12 (×5): 50 mg via ORAL
  Filled 2017-06-08 (×5): qty 1

## 2017-06-08 MED ORDER — TRAMADOL HCL 50 MG PO TABS
50.0000 mg | ORAL_TABLET | ORAL | Status: DC | PRN
  Administered 2017-06-08 – 2017-06-13 (×14): 50 mg via ORAL
  Filled 2017-06-08 (×15): qty 1

## 2017-06-08 NOTE — Progress Notes (Signed)
Occupational Therapy Session Note  Patient Details  Name: Sherry [name] MRN: [id number] Date of Birth: 1945/10/26  Today's Date: 06/08/2017 OT Individual Time: 1100-1200 OT Individual Time Calculation (min): 60 min    Short Term Goals: Week 1:  OT Short Term Goal 1 (Week 1): Pt will complete toilet transfers with supervision to elevated 3:1 with RW.  OT Short Term Goal 2 (Week 1): Pt will complete walk-in shower transfers with supervision using RW and tub bench. OT Short Term Goal 3 (Week 1): Pt will perform LB bathing sit to stand with min assist.  OT Short Term Goal 4 (Week 1): Pt will perform LB dressing sit to stand with supervision and AE PRN.  Skilled Therapeutic Interventions/Progress Updates:    1:1. Pt requesting to bathe this session. Pt completes all ambulatory transfers to/from all  Surfaces with RW. No B KI's needed per RN note from ortho surgeon. Pt bathes at seated level leaning laterally to wash buttocks. Pt dresses at sit to stand level from TTB with CGA for LB dressing and VC to hook bra in front and turn towards back. OT dons teds and demo use of sock aide to don B non skid socks. Pt able to return demonstrate with min instructional cues. Exited session with pt seated in bed, call light in reach and all needs met.   Therapy Documentation Precautions:  Precautions Precautions: Knee, Fall Required Braces or Orthoses: Knee Immobilizer - Left, Knee Immobilizer - Right Knee Immobilizer - Right: Discontinue once straight leg raise with < 10 degree lag Knee Immobilizer - Left: Discontinue once straight leg raise with < 10 degree lag Restrictions Weight Bearing Restrictions: No RLE Weight Bearing: Weight bearing as tolerated LLE Weight Bearing: Weight bearing as tolerated Current Functional Status.   Therapy/Group: Individual Therapy  Tonny Branch 06/08/2017, 12:06 PM

## 2017-06-08 NOTE — Progress Notes (Signed)
[facility] PHYSICAL MEDICINE & REHABILITATION     PROGRESS NOTE  Subjective/Complaints:  Patient seen sitting up in bed this morning.  She states she did not sleep well overnight due to several reasons.  She has several questions and concerns regarding the use of TED hose, pain medications, icing her knees (previously discussed).  ROS: Denies CP, SOB, N/V/D.  Objective: Vital Signs: Blood pressure 135/65, pulse 99, temperature 97.6 F (36.4 C), temperature source Oral, resp. rate 18, height 5\' 5"  (1.651 m), weight 72 kg (158 lb 11.7 oz), last menstrual period 04/14/1997, SpO2 99 %. No results found. Recent Labs    06/07/17 0558 06/08/17 0635  WBC 6.5 8.8  HGB 8.6* 8.8*  HCT 26.9* 28.0*  PLT 513* 573*   No results for input(s): NA, K, CL, GLUCOSE, BUN, CREATININE, CALCIUM in the last 72 hours.  Invalid input(s): CO CBG (last 3)  No results for input(s): GLUCAP in the last 72 hours.  Wt Readings from Last 3 Encounters:  06/02/17 72 kg (158 lb 11.7 oz)  05/27/17 67.1 kg (148 lb)  05/22/17 67.1 kg (148 lb)    Physical Exam:  BP 135/65 (BP Location: Right Arm)   Pulse 99   Temp 97.6 F (36.4 C) (Oral)   Resp 18   Ht 5\' 5"  (1.651 m)   Wt 72 kg (158 lb 11.7 oz)   LMP 04/14/1997   SpO2 99%   BMI 26.41 kg/m  Constitutional: She appears well-developed. NAD. HENT: Normocephalic. Atraumatic.  Eyes: EOM are normal. No discharge.  Cardiovascular: RRR. No JVD. Respiratory: Effort normal and breath sounds normal. GI: Bowel sounds are normal. She exhibits no distension.  Musc: B/l knee tenderness with edema and tenderness Neurological.  Follows full commands.  Motor: B/l upper extremities 5/5 proximal to distal.  B/l lower extremities: hip flexors 2/5, ankle dorsi/plantar flexion 5/5 (slightly improved) Skin. Warm and dry bilateral knee incisions with some erythema along left knee incision. No drainage noted.  Psych: Anxious  Assessment/Plan: 1. 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 Physiatrist is providing close team supervision and 24 hour management of active medical problems listed below. Physiatrist and rehab team continue to assess barriers to discharge/monitor patient progress toward functional and medical goals.  Function:  Bathing Bathing position   Position: Shower  Bathing parts Body parts bathed by patient: Right arm, Left arm, Abdomen, Chest, Right upper leg, Left upper leg, Right lower leg, Left lower leg, Front perineal area, Buttocks Body parts bathed by helper: Back, Left lower leg, Right lower leg  Bathing assist Assist Level: Supervision or verbal cues      Upper Body Dressing/Undressing Upper body dressing   What is the patient wearing?: Pull over shirt/dress     Pull over shirt/dress - Perfomed by patient: Thread/unthread right sleeve, Thread/unthread left sleeve, Put head through opening, Pull shirt over trunk   Button up shirt - Perfomed by patient: Thread/unthread right sleeve, Thread/unthread left sleeve, Pull shirt around back, Button/unbutton shirt      Upper body assist Assist Level: Set up   Set up : To obtain clothing/put away  Lower Body Dressing/Undressing Lower body dressing   What is the patient wearing?: Pants, Non-skid slipper socks, Ted Hose     Pants- Performed by patient: Thread/unthread right pants leg, Thread/unthread left pants leg, Pull pants up/down Pants- Performed by helper: Thread/unthread left  pants leg, Pull pants up/down Non-skid slipper socks- Performed by patient: Don/doff right sock, Don/doff left sock Non-skid slipper socks- Performed by helper: Don/doff right sock, Don/doff left sock               TED Hose - Performed by helper: Don/doff right TED hose, Don/doff left TED hose  Lower body assist Assist for lower body  dressing: Touching or steadying assistance (Pt > 75%)      Toileting Toileting   Toileting steps completed by patient: Adjust clothing prior to toileting, Adjust clothing after toileting Toileting steps completed by helper: Performs perineal hygiene    Toileting assist Assist level: Set up/obtain supplies   Transfers Chair/bed transfer   Chair/bed transfer method: Stand pivot Chair/bed transfer assist level: Touching or steadying assistance (Pt > 75%) Chair/bed transfer assistive device: Walker, Air cabin crew     Max distance: 86 ft Assist level: Touching or steadying assistance (Pt > 75%)   Wheelchair   Type: Manual Max wheelchair distance: 150' Assist Level: Supervision or verbal cues  Cognition Comprehension Comprehension assist level: Follows complex conversation/direction with no assist  Expression Expression assist level: Expresses complex ideas: With no assist  Social Interaction Social Interaction assist level: Interacts appropriately with others - No medications needed.  Problem Solving Problem solving assist level: Solves complex problems: With extra time  Memory Memory assist level: Complete Independence: No helper    Medical Problem List and Plan:  1. Decreased functional mobility secondary to end-stage osteoarthritis bilateral knees. Status post bilateral TKA 05/27/2017. Weightbearing as tolerated.    Continue CIR  2. DVT Prophylaxis/Anticoagulation: Coumadin for DVT prophylaxis. Plan is for Coumadin 4 weeks then aspirin 81 mg daily    Vascular study negative for DVT   INR therapeutic on 2/25 3. Pain Management: Neurontin 300 mg 3 times a day, Valium and oxycodone as needed. Monitor narcotics for increased sedation    Scheduled ice to knees while awake , encouraged use again   Patient extremely sensitive to oxycodone, added when necessary low dose tramadol on 2/22, increased on 2/25   Poor coping mechanisms 4. Mood: Provide emotional  support, with reactive depression   Will request neuropsych consult   Team to provide encouragement 5. Neuropsych: This patient is capable of making decisions on her own behalf.  6. Skin/Wound Care: Routine skin checks  7. Fluids/Electrolytes/Nutrition: Routine I&O's    BMP within acceptable range on 2/22 8. Acute blood loss anemia.    Hb 8.8 on 2/25   Cont to monitor 9. Chronic constipation. Continue Movantik, qod miralax, dulcolax suppository tonight    Hx of IBS  10.GERD.Prilosec              -prn maalox 11. Tachycardia    Monitor in accordance with pain and increasing activity   Improving  12. Hypoalbuminemia   Supplement initiated on 2/20 13. Sleep disturbance   Trazodone as needed started on 2/25  LOS (Days) 6 A FACE TO FACE EVALUATION WAS PERFORMED  Sherry [name] 06/08/2017 10:38 AM

## 2017-06-08 NOTE — Progress Notes (Signed)
Physical Therapy Session Note  Patient Details  Name: Sherry [name] MRN: [id number] Date of Birth: 08-Mar-1946  Today's Date: 06/08/2017 PT Individual Time: 0915-1000, 1660-6301  PT Individual Time Calculation (min): 45 min , 59 min  Short Term Goals: Week 1:  PT Short Term Goal 1 (Week 1): Pt will ambulate x 100 ft with RW and min assist PT Short Term Goal 2 (Week 1): Pt will perform sit<>stand with min assist PT Short Term Goal 3 (Week 1): Pt will perform x 10 straight leg raises bilaterally  Skilled Therapeutic Interventions/Progress Updates:    Session 1: Pt supine in bed upon PT arrival, agreeable to therapy tx and reports pain 5/10 pain, R knee>L knee. Pt transferred from supine>sitting EOB with supervision. Pt ambulated from room using RW and min assist x 120 ft, limited by pain, verbal cues for increased knee flexion during gait. Pt performed 2 x 10 heel slides and 2 x 10 LAQ while seated in w/c, emphasis on moving through full available ROM. Pt ascended/descended 4 steps with B handrails and min assist, step to pattern with verbal cues for techniques. Pt ascended/descended x 2 steps with B handrails and then x 2 steps with RW to simulate home environment, min assist. Pt transported back to room and transferred to toilet via ambulation with RW, left seated in bathroom in care on RN student.   Session 2: Pt supine in bed upon PT arrival, agreeable to therapy tx and reports pain 7/10 pain, R knee>L knee. Pt transferred from supine>sitting EOB with supervision. Pt ambulated from room using RW and min assist x 120 ft, limited by pain, verbal cues for increased knee flexion during gait. Pt performed x 10 partial SLR from sitting in w/c, pt reports pain in R medial knee during exercise. Pt ambulated from w/c>mat x 20 ft with RW and supervision. Pt performed x 3 sit<>stands from elevated mat with emphasis on pushing through LEs and LE ROM. Pt transferred from sitting<>supine with min assist, in  supine pt performed LE therex for strengthening and ROM: 2 x 10 heel slides, 2 x 10 SAQ, 2 x 10 assisted SLR. Therapist performed manual knee flexion and knee extension stretches 2 x 30 sec bilaterally. Pt transferred to sitting with min assist and transferred to w/c with supervision. Pt transported back to room and left seated in w/c with needs in reach.   Therapy Documentation Precautions:  Precautions Precautions: Knee, Fall Required Braces or Orthoses: Knee Immobilizer - Left, Knee Immobilizer - Right Knee Immobilizer - Right: Discontinue once straight leg raise with < 10 degree lag Knee Immobilizer - Left: Discontinue once straight leg raise with < 10 degree lag Restrictions Weight Bearing Restrictions: No RLE Weight Bearing: Weight bearing as tolerated LLE Weight Bearing: Weight bearing as tolerated   See Function Navigator for Current Functional Status.   Therapy/Group: Individual Therapy  Netta Corrigan, PT, DPT 06/08/2017, 7:52 AM

## 2017-06-08 NOTE — Progress Notes (Signed)
[place] for Coumadin Indication: VTE prophylaxis s/p TKA   Allergies  Allergen Reactions  . Adhesive [Tape] Other (See Comments)    Blisters; "gets puffy and red" Liquid Bandaids    Patient Measurements: Height: 5\' 5"  (165.1 cm) Weight: 158 lb 11.7 oz (72 kg) IBW/kg (Calculated) : 57  Vital Signs: BP: 135/65 (02/25 0603) Pulse Rate: 99 (02/25 0603)  Labs: Recent Labs    06/06/17 0714 06/07/17 0558 06/08/17 0635  HGB  --  8.6* 8.8*  HCT  --  26.9* 28.0*  PLT  --  513* 573*  LABPROT 22.5* 22.4* 24.2*  INR 2.00 1.99 2.20    Estimated Creatinine Clearance: 65.1 mL/min (by C-G formula based on SCr of 0.62 mg/dL).   Medical History: Past Medical History:  Diagnosis Date  . Anxiety   . Carpal tunnel syndrome on both sides   . Chronic back pain    herniated disc,stenosis,radiculopathy,spondylolisthesis  . Chronic neck pain   . Constipation, chronic   . Degenerative spinal arthritis    cervical and lumbar  . Depression   . Eczema   . GERD (gastroesophageal reflux disease)   . Hemorrhoids   . Hiatal hernia   . History of bronchitis    "I've had it a number of times"  . IBS (irritable bowel syndrome)    lactose intolerant  . Osteoarthritis    bilateral knees and shoulders,  little fingers  . TMJ (dislocation of temporomandibular joint)   . Wears glasses    Assessment: 56 YOF with history of BL osteoarthritis of the knees. Presented to [facility] on 2/13 for BL TKA. Patient not on anticoagulation prior to admission. Coumadin ordered post op for VTE prophylaxis. Ortho team plans to continue with Coumadin for 4 weeks (from 05/28/17), then transition to aspirin 81mg  daily.  INR today = 2.2 H/H low but stable, PLT wnl/high No bleeding noted  Goal of Therapy:   INR 2-3  Monitor for platelets: Yes  Plan:  DC Lovenox  Warfarin 5 mg po daily at 1800 pm Daily INR  Thank you, Anette Guarneri, PharmD [phone]

## 2017-06-08 NOTE — Progress Notes (Signed)
Occupational Therapy Session Note  Patient Details  Name: Sherry [name] MRN: [id number] Date of Birth: [birth year]  Today's Date: 06/08/2017 OT Individual Time: 2778-2423 OT Individual Time Calculation (min): 30 min    Short Term Goals: Week 1:  OT Short Term Goal 1 (Week 1): Pt will complete toilet transfers with supervision to elevated 3:1 with RW.  OT Short Term Goal 2 (Week 1): Pt will complete walk-in shower transfers with supervision using RW and tub bench. OT Short Term Goal 3 (Week 1): Pt will perform LB bathing sit to stand with min assist.  OT Short Term Goal 4 (Week 1): Pt will perform LB dressing sit to stand with supervision and AE PRN.  Skilled Therapeutic Interventions/Progress Updates:    Pt supine in bed upon entering the room with 8 /10 c/o pain in B knees. RN notified for pain medication. Pt performed supine >sit with leg lifter needed for B LEs for bed mobility with overall supervision. Pt seated on EOB to start breakfast with OT educating pt on pain management in order to increase active participation during therapy sessions. Pt returning to supine secondary to increased pain and ice applied to B knees. Breakfast tray set up with call bell and all needed items within reach upon exiting the room.   Therapy Documentation Precautions:  Precautions Precautions: Knee, Fall Required Braces or Orthoses: Knee Immobilizer - Left, Knee Immobilizer - Right Knee Immobilizer - Right: Discontinue once straight leg raise with < 10 degree lag Knee Immobilizer - Left: Discontinue once straight leg raise with < 10 degree lag Restrictions Weight Bearing Restrictions: No RLE Weight Bearing: Weight bearing as tolerated LLE Weight Bearing: Weight bearing as tolerated General:   Vital Signs: Therapy Vitals Pulse Rate: 99 Resp: 18 BP: 135/65 Patient Position (if appropriate): Sitting Oxygen Therapy SpO2: 99 %  See Function Navigator for Current Functional  Status.   Therapy/Group: Individual Therapy  Gypsy Decant 06/08/2017, 8:13 AM

## 2017-06-08 NOTE — Progress Notes (Signed)
Orthopedic Tech Progress Note Patient Details:  Sherry [name] 19-Nov-1945 [id number] Put on cpm at [place] Location: on cpm at [place] Interventions Patient Tolerated: Well Instructions Provided: Care of device   Braulio Bosch 06/08/2017, 7:54 PM

## 2017-06-09 ENCOUNTER — Inpatient Hospital Stay (HOSPITAL_COMMUNITY): Payer: Medicare Other | Admitting: Occupational Therapy

## 2017-06-09 ENCOUNTER — Inpatient Hospital Stay (HOSPITAL_COMMUNITY): Payer: Medicare Other

## 2017-06-09 ENCOUNTER — Inpatient Hospital Stay (HOSPITAL_COMMUNITY): Payer: Medicare Other | Admitting: Physical Therapy

## 2017-06-09 LAB — PROTIME-INR
INR: 2.17
Prothrombin Time: 24 seconds — ABNORMAL HIGH (ref 11.4–15.2)

## 2017-06-09 LAB — CBC
HCT: 29.6 % — ABNORMAL LOW (ref 36.0–46.0)
Hemoglobin: 9.4 g/dL — ABNORMAL LOW (ref 12.0–15.0)
MCH: 31.3 pg (ref 26.0–34.0)
MCHC: 31.8 g/dL (ref 30.0–36.0)
MCV: 98.7 fL (ref 78.0–100.0)
Platelets: 621 10*3/uL — ABNORMAL HIGH (ref 150–400)
RBC: 3 MIL/uL — ABNORMAL LOW (ref 3.87–5.11)
RDW: 15.3 % (ref 11.5–15.5)
WBC: 6.6 10*3/uL (ref 4.0–10.5)

## 2017-06-09 NOTE — Progress Notes (Signed)
Physical Therapy Session Note  Patient Details  Name: Sherry [name] MRN: [id number] Date of Birth: [birth year]  Today's Date: 06/09/2017 PT Individual Time: 6767-2094 PT Individual Time Calculation (min): 72 min   Short Term Goals: Week 1:  PT Short Term Goal 1 (Week 1): Pt will ambulate x 100 ft with RW and min assist PT Short Term Goal 2 (Week 1): Pt will perform sit<>stand with min assist PT Short Term Goal 3 (Week 1): Pt will perform x 10 straight leg raises bilaterally  Skilled Therapeutic Interventions/Progress Updates:    Pt seated in w/c upon PT arrival, agreeable to therapy tx and reports pain 8/10 in B knees. Pt transferred to standing with supervision and ambulated to the gym x 200 ft with supervision using RW. Knee flexion and knee extension manual stretching to end range 2 x 1 min each. Pt lacking 10 degrees from full extension bilaterally, R PROM knee flexion to 84 deg and L PROM knee flexion to 73 deg. Pt performed 2 x 10 LAQ. Pt transferred to w/c and transported to dayroom. Pt used nustep on workload 3 for LEs only x 7 min emphasis on knee AROM and strengthening. Pt propelled w/c throughout unit and outside 200 ft using B UEs. While seated in w/c pt performed 2 x 10 LAQ, 2 x 10 SLR, 2 x 10 adduction and abduction. Pt ambulated x 100 ft with RW and supervision, verbal cues for increased knee flexion, pt left seated in recliner with needs in reach.   Therapy Documentation Precautions:  Precautions Precautions: Knee, Fall Required Braces or Orthoses: Knee Immobilizer - Left, Knee Immobilizer - Right Knee Immobilizer - Right: Discontinue once straight leg raise with < 10 degree lag Knee Immobilizer - Left: Discontinue once straight leg raise with < 10 degree lag Restrictions Weight Bearing Restrictions: Yes RLE Weight Bearing: Weight bearing as tolerated LLE Weight Bearing: Weight bearing as tolerated   See Function Navigator for Current Functional  Status.   Therapy/Group: Individual Therapy  Netta Corrigan, PT, DPT 06/09/2017, 1:46 PM

## 2017-06-09 NOTE — Progress Notes (Signed)
Physical Therapy Session Note  Patient Details  Name: Sherry [name] MRN: [id number] Date of Birth: 07-19-1945  Today's Date: 06/09/2017 PT Individual Time: 7416-3845 PT Individual Time Calculation (min): 25 min   Short Term Goals: Week 1:  PT Short Term Goal 1 (Week 1): Pt will ambulate x 100 ft with RW and min assist PT Short Term Goal 2 (Week 1): Pt will perform sit<>stand with min assist PT Short Term Goal 3 (Week 1): Pt will perform x 10 straight leg raises bilaterally  Skilled Therapeutic Interventions/Progress Updates:   Pt in supine and agreeable to therapy, c/o pain as detailed below. Provided passive knee extension stretch in 20-30 sec bouts x5 at both knees to work on knee ROM. Utilized contract/relax method w/ mild increase flexibility and some decrease of pain. Additionally provided gentle knee ROM into flexion and extension for pain relief and loosening of tissues. Total assist to place LEs on CPMs, increased to 0-60 deg w/o increase in pain. Pt tolerating motion well, resting comfortably in supine at end of session, call bell within reach and all needs met.   Therapy Documentation Precautions:  Precautions Precautions: Knee, Fall Required Braces or Orthoses: Knee Immobilizer - Left, Knee Immobilizer - Right Knee Immobilizer - Right: Discontinue once straight leg raise with < 10 degree lag Knee Immobilizer - Left: Discontinue once straight leg raise with < 10 degree lag Restrictions Weight Bearing Restrictions: Yes RLE Weight Bearing: Weight bearing as tolerated LLE Weight Bearing: Weight bearing as tolerated Vital Signs: Therapy Vitals Temp: (!) 97.4 F (36.3 C) Temp Source: Oral Pulse Rate: (!) 109 Resp: 18 BP: (!) 145/64 Patient Position (if appropriate): Sitting Oxygen Therapy SpO2: 96 % O2 Device: Room Air Pain: Pain Assessment Pain Assessment: 0-10 Pain Score: 7  Pain Type: Acute pain Pain Location: Knee Pain Orientation: Right;Left Pain  Descriptors / Indicators: Aching Pain Onset: Gradual Pain Intervention(s): Medication (See eMAR);Cold applied  See Function Navigator for Current Functional Status.   Therapy/Group: Individual Therapy  Kayl Stogdill K Arnette 06/09/2017, 5:00 PM

## 2017-06-09 NOTE — Progress Notes (Signed)
[facility] PHYSICAL MEDICINE & REHABILITATION     PROGRESS NOTE  Subjective/Complaints:  Pt seen lying in bed this AM.  She slept well overnight.  She notes improvement in pain when asked, but then states it hurts her when she has increased ROM, educated on expectations.  She also has questions about discharge equipment.   ROS: Denies CP, SOB, N/V/D.  Objective: Vital Signs: Blood pressure 129/64, pulse 92, temperature 97.6 F (36.4 C), temperature source Oral, resp. rate 16, height 5\' 5"  (1.651 m), weight 72 kg (158 lb 11.7 oz), last menstrual period 04/14/1997, SpO2 100 %. No results found. Recent Labs    06/07/17 0558 06/08/17 0635  WBC 6.5 8.8  HGB 8.6* 8.8*  HCT 26.9* 28.0*  PLT 513* 573*   No results for input(s): NA, K, CL, GLUCOSE, BUN, CREATININE, CALCIUM in the last 72 hours.  Invalid input(s): CO CBG (last 3)  No results for input(s): GLUCAP in the last 72 hours.  Wt Readings from Last 3 Encounters:  06/02/17 72 kg (158 lb 11.7 oz)  05/27/17 67.1 kg (148 lb)  05/22/17 67.1 kg (148 lb)    Physical Exam:  BP 129/64 (BP Location: Right Arm)   Pulse 92   Temp 97.6 F (36.4 C) (Oral)   Resp 16   Ht 5\' 5"  (1.651 m)   Wt 72 kg (158 lb 11.7 oz)   LMP 04/14/1997   SpO2 100%   BMI 26.41 kg/m  Constitutional: She appears well-developed. NAD. HENT: Normocephalic. Atraumatic.  Eyes: EOM are normal. No discharge.  Cardiovascular: RRR. No JVD. Respiratory: Effort normal and breath sounds normal. GI: Bowel sounds are normal. She exhibits no distension.  Musc: B/l knee tenderness with edema and tenderness Neurological.  Follows full commands.  Motor: B/l upper extremities 5/5 proximal to distal.  B/l lower extremities: hip flexors 2+/5, ankle dorsi/plantar flexion 5/5 Skin. Warm and dry bilateral knee incisions with some erythema along left knee incision. No drainage noted.  Psych: Anxious  Assessment/Plan: 1. Functional deficits secondary to end-stage  osteoarthritis bilateral knees status post bilateral TKA which require 3+ hours per day of interdisciplinary therapy in a comprehensive inpatient rehab setting. Physiatrist is providing close team supervision and 24 hour management of active medical problems listed below. Physiatrist and rehab team continue to assess barriers to discharge/monitor patient progress toward functional and medical goals.  Function:  Bathing Bathing position   Position: Shower  Bathing parts Body parts bathed by patient: Right arm, Left arm, Abdomen, Chest, Right upper leg, Left upper leg, Right lower leg, Left lower leg, Front perineal area, Buttocks Body parts bathed by helper: Back, Left lower leg, Right lower leg  Bathing assist Assist Level: Supervision or verbal cues      Upper Body Dressing/Undressing Upper body dressing   What is the patient wearing?: Pull over shirt/dress     Pull over shirt/dress - Perfomed by patient: Thread/unthread right sleeve, Thread/unthread left sleeve, Put head through opening, Pull shirt over trunk   Button up shirt - Perfomed by patient: Thread/unthread right sleeve, Thread/unthread left sleeve, Pull shirt around back, Button/unbutton shirt      Upper body assist Assist Level: Set up   Set up : To obtain clothing/put away  Lower Body Dressing/Undressing Lower body dressing   What is the patient wearing?: Pants, Non-skid slipper socks, Ted Hose     Pants- Performed by patient: Thread/unthread right pants leg, Thread/unthread left pants leg, Pull pants up/down Pants- Performed by helper: Thread/unthread left  pants leg, Pull pants up/down Non-skid slipper socks- Performed by patient: Don/doff right sock, Don/doff left sock Non-skid slipper socks- Performed by helper: Don/doff right sock, Don/doff left sock               TED Hose - Performed by helper: Don/doff right TED hose, Don/doff left TED hose  Lower body assist Assist for lower body dressing: Touching or  steadying assistance (Pt > 75%)      Toileting Toileting   Toileting steps completed by patient: Adjust clothing prior to toileting, Adjust clothing after toileting Toileting steps completed by helper: Adjust clothing prior to toileting, Performs perineal hygiene, Adjust clothing after toileting    Toileting assist Assist level: Touching or steadying assistance (Pt.75%)   Transfers Chair/bed transfer   Chair/bed transfer method: Stand pivot Chair/bed transfer assist level: Touching or steadying assistance (Pt > 75%) Chair/bed transfer assistive device: Walker, Air cabin crew     Max distance: 120 ft Assist level: Touching or steadying assistance (Pt > 75%)   Wheelchair   Type: Manual Max wheelchair distance: 150' Assist Level: Supervision or verbal cues  Cognition Comprehension Comprehension assist level: Follows complex conversation/direction with no assist  Expression Expression assist level: Expresses complex ideas: With no assist  Social Interaction Social Interaction assist level: Interacts appropriately with others - No medications needed.  Problem Solving Problem solving assist level: Solves complex problems: With extra time  Memory Memory assist level: Complete Independence: No helper    Medical Problem List and Plan:  1. Decreased functional mobility secondary to end-stage osteoarthritis bilateral knees. Status post bilateral TKA 05/27/2017. Weightbearing as tolerated.    Continue CIR  2. DVT Prophylaxis/Anticoagulation: Coumadin for DVT prophylaxis. Plan is for Coumadin 4 weeks then aspirin 81 mg daily    Vascular study negative for DVT   INR therapeutic on 2/25, pending for today 3. Pain Management: Neurontin 300 mg 3 times a day, Valium and oxycodone as needed. Monitor narcotics for increased sedation    Scheduled ice to knees while awake , encouraged use again   Patient sensitive to oxycodone, added when necessary low dose tramadol on 2/22,  increased on 2/25   Poor coping mechanisms, cont to educate 4. Mood: Provide emotional support, with reactive depression   Will request neuropsych consult   Team to provide encouragement 5. Neuropsych: This patient is capable of making decisions on her own behalf.  6. Skin/Wound Care: Routine skin checks  7. Fluids/Electrolytes/Nutrition: Routine I&O's    BMP within acceptable range on 2/22 8. Acute blood loss anemia.    Hb 8.8 on 2/25   Cont to monitor 9. Chronic constipation. Continue Movantik, qod miralax, dulcolax suppository tonight    Hx of IBS  10.GERD.Prilosec              prn maalox 11. Tachycardia    Monitor in accordance with pain and increasing activity   Improving overall 12. Hypoalbuminemia   Supplement initiated on 2/20 13. Sleep disturbance   Trazodone as needed started on 2/25   Improving  LOS (Days) 7 A FACE TO FACE EVALUATION WAS PERFORMED  Elwin Tsou Lorie Phenix 06/09/2017 8:30 AM

## 2017-06-09 NOTE — Progress Notes (Signed)
Occupational Therapy Session Note  Patient Details  Name: Sherry [name] MRN: [id number] Date of Birth: 02/04/1946  Today's Date: 06/09/2017 OT Individual Time: 1000-1053 OT Individual Time Calculation (min): 53 min    Short Term Goals: Week 1:  OT Short Term Goal 1 (Week 1): Pt will complete toilet transfers with supervision to elevated 3:1 with RW.  OT Short Term Goal 2 (Week 1): Pt will complete walk-in shower transfers with supervision using RW and tub bench. OT Short Term Goal 3 (Week 1): Pt will perform LB bathing sit to stand with min assist.  OT Short Term Goal 4 (Week 1): Pt will perform LB dressing sit to stand with supervision and AE PRN.  Skilled Therapeutic Interventions/Progress Updates:    Pt completed shower, grooming, toileting, and dressing during session.  She was able to complete all transfers with use of the RW and min instructional cueing and min guard assist.  She managed all clothing for toileting with increased time and supervision.  Bathing in sitting only with supervision and lateral leans for washing her peri area.  She transferred out to the wheelchair with the RW for dressing at supervision level.  She completed all aspects of dressing with supervision, needing only min instructional cueing for use of the reacher and technique for donning shoes and then tying them.  She gathered up all dirty clothing and placed them in with her laundry at end of session with use of the walker, walker bag, and reacher.  She transferred to the recliner to finish session with call button and phone in reach.    Therapy Documentation Precautions:  Precautions Precautions: Knee, Fall Required Braces or Orthoses: Knee Immobilizer - Left, Knee Immobilizer - Right Knee Immobilizer - Right: Discontinue once straight leg raise with < 10 degree lag Knee Immobilizer - Left: Discontinue once straight leg raise with < 10 degree lag Restrictions Weight Bearing Restrictions: Yes RLE Weight  Bearing: Weight bearing as tolerated LLE Weight Bearing: Weight bearing as tolerated   Pain: Pain Assessment Pain Assessment: 0-10 Pain Score: 7  Pain Type: Acute pain;Surgical pain Pain Location: Knee Pain Orientation: Right;Left Pain Descriptors / Indicators: Discomfort;Grimacing Pain Frequency: Constant Pain Onset: On-going Pain Intervention(s): RN made aware;Emotional support;Repositioned ADL: See Function Navigator for Current Functional Status.   Therapy/Group: Individual Therapy  Novalie Leamy OTR/L 06/09/2017, 10:57 AM

## 2017-06-09 NOTE — Progress Notes (Signed)
[place] for Coumadin Indication: VTE prophylaxis s/p TKA   Allergies  Allergen Reactions  . Adhesive [Tape] Other (See Comments)    Blisters; "gets puffy and red" Liquid Bandaids    Patient Measurements: Height: 5\' 5"  (165.1 cm) Weight: 158 lb 11.7 oz (72 kg) IBW/kg (Calculated) : 57  Vital Signs: Temp: 97.6 F (36.4 C) (02/26 0200) Temp Source: Oral (02/26 0200) BP: 129/64 (02/26 0200) Pulse Rate: 92 (02/26 0200)  Labs: Recent Labs    06/07/17 0558 06/08/17 0635 06/09/17 0806  HGB 8.6* 8.8* 9.4*  HCT 26.9* 28.0* 29.6*  PLT 513* 573* 621*  LABPROT 22.4* 24.2* 24.0*  INR 1.99 2.20 2.17    Estimated Creatinine Clearance: 65.1 mL/min (by C-G formula based on SCr of 0.62 mg/dL).   Medical History: Past Medical History:  Diagnosis Date  . Anxiety   . Carpal tunnel syndrome on both sides   . Chronic back pain    herniated disc,stenosis,radiculopathy,spondylolisthesis  . Chronic neck pain   . Constipation, chronic   . Degenerative spinal arthritis    cervical and lumbar  . Depression   . Eczema   . GERD (gastroesophageal reflux disease)   . Hemorrhoids   . Hiatal hernia   . History of bronchitis    "I've had it a number of times"  . IBS (irritable bowel syndrome)    lactose intolerant  . Osteoarthritis    bilateral knees and shoulders,  little fingers  . TMJ (dislocation of temporomandibular joint)   . Wears glasses    Assessment: 28 YOF with history of BL osteoarthritis of the knees. Presented [facility]/13 for BL TKA. Patient not on anticoagulation prior to admission. Coumadin ordered post op for VTE prophylaxis. Ortho team plans to continue with Coumadin for 4 weeks (from 05/28/17), then transition to aspirin 81mg  daily.  INR today = 2.17 H/H low but stable, PLT wnl/high No bleeding noted  Goal of Therapy:   INR 2-3  Monitor for platelets: Yes  Plan:  Continue Warfarin 5 mg po daily at 1800  pm Daily INR  Thank you, Anette Guarneri, PharmD [phone]

## 2017-06-09 NOTE — Progress Notes (Signed)
Occupational Therapy Session Note  Patient Details  Name: Sherry [name] MRN: [id number] Date of Birth: 02/01/1946  Today's Date: 06/09/2017 OT Individual Time: 1446-1531 OT Individual Time Calculation (min): 45 min    Short Term Goals: Week 1:  OT Short Term Goal 1 (Week 1): Pt will complete toilet transfers with supervision to elevated 3:1 with RW.  OT Short Term Goal 2 (Week 1): Pt will complete walk-in shower transfers with supervision using RW and tub bench. OT Short Term Goal 3 (Week 1): Pt will perform LB bathing sit to stand with min assist.  OT Short Term Goal 4 (Week 1): Pt will perform LB dressing sit to stand with supervision and AE PRN.  Skilled Therapeutic Interventions/Progress Updates:    Pt completed walker use in the kitchen using counter tops and walker bag to help transfer items from surfaces to their correct location, all with supervision.  She was able to complete all functional mobility with use of the walker throughout the kitchen with supervision and also ambulated over to the bed for simulated bed transfer.  She transferred into and out of the bed with supervision, self managing her own LEs to complete the transfer.  Had her propel her wheelchair to her room with supervision and then opted to transfer back in the bed to rest for next PT session.  Call button and phone in reach.    Therapy Documentation Precautions:  Precautions Precautions: Knee, Fall Required Braces or Orthoses: Knee Immobilizer - Left, Knee Immobilizer - Right Knee Immobilizer - Right: Discontinue once straight leg raise with < 10 degree lag Knee Immobilizer - Left: Discontinue once straight leg raise with < 10 degree lag Restrictions Weight Bearing Restrictions: Yes RLE Weight Bearing: Weight bearing as tolerated LLE Weight Bearing: Weight bearing as tolerated  Pain: Pain Assessment Pain Assessment: 0-10 Pain Score: 8  Pain Type: Acute pain Pain Location: Knee Pain Orientation:  Right;Left Pain Descriptors / Indicators: Aching Pain Onset: Gradual Pain Intervention(s): Medication (See eMAR);Cold applied ADL: See Function Navigator for Current Functional Status.   Therapy/Group: Individual Therapy  Marda Breidenbach  OTR/L 06/09/2017, 4:02 PM

## 2017-06-10 ENCOUNTER — Inpatient Hospital Stay (HOSPITAL_COMMUNITY): Payer: Medicare Other

## 2017-06-10 ENCOUNTER — Inpatient Hospital Stay (HOSPITAL_COMMUNITY): Payer: Medicare Other | Admitting: Occupational Therapy

## 2017-06-10 LAB — CBC
HCT: 24.9 % — ABNORMAL LOW (ref 36.0–46.0)
Hemoglobin: 8.1 g/dL — ABNORMAL LOW (ref 12.0–15.0)
MCH: 31.9 pg (ref 26.0–34.0)
MCHC: 32.5 g/dL (ref 30.0–36.0)
MCV: 98 fL (ref 78.0–100.0)
Platelets: 535 10*3/uL — ABNORMAL HIGH (ref 150–400)
RBC: 2.54 MIL/uL — ABNORMAL LOW (ref 3.87–5.11)
RDW: 15.1 % (ref 11.5–15.5)
WBC: 4.5 10*3/uL (ref 4.0–10.5)

## 2017-06-10 LAB — PROTIME-INR
INR: 2.26
Prothrombin Time: 24.8 seconds — ABNORMAL HIGH (ref 11.4–15.2)

## 2017-06-10 NOTE — Patient Care Conference (Signed)
Inpatient RehabilitationTeam Conference and Plan of Care Update Date: 06/10/2017   Time: 10:10 Am    Patient Name: Sherry [name]      Medical Record Number: [id number]  Date of Birth: 06/28/1945 Sex: Female         Room/Bed: 4M11C/4M11C-01 Payor Info: Payor: [employer] MEDICARE / Plan: [facility] MEDICARE / Product Type: *No Product type* /    Admitting Diagnosis: b tka  Admit Date/Time:  06/02/2017 11:45 AM Admission Comments: No comment available   Primary Diagnosis:  <principal problem not specified> Principal Problem: <principal problem not specified>  Patient Active Problem List   Diagnosis Date Noted  . Anxiety state   . Sleep disturbance   . Subtherapeutic international normalized ratio (INR)   . Reactive depression   . Post-operative pain   . Tachycardia   . Hypoalbuminemia due to protein-calorie malnutrition ([place])   . Constipation due to pain medication   . Acute blood loss anemia   . Status post bilateral knee replacements 06/02/2017  . OA (osteoarthritis) of knee 05/27/2017  . Flank pain 10/03/2011  . Abdominal pain 09/30/2011  . [place] DISEASE, LUMBAR SPINE 05/07/2010  . BACK PAIN, LUMBAR 05/07/2010  . Abdominal pain, unspecified site 06/08/2009  . RLQ PAIN 03/30/2009  . ABNORMAL FINDINGS GI TRACT 03/30/2009  . ANXIETY 03/22/2009  . DEPRESSION 03/22/2009  . GERD 03/22/2009  . CONSTIPATION 03/22/2009  . FLATULENCE-GAS-BLOATING 03/22/2009  . PERSONAL HX COLONIC POLYPS 03/22/2009    Expected Discharge Date: Expected Discharge Date: 06/13/17  Team Members Present: Physician leading conference: Dr. Delice Lesch Social Worker Present: Ovidio Kin, LCSW Nurse Present: Frances Maywood, RN PT Present: Michaelene Song, PT OT Present: Clyda Greener, OT PPS Coordinator present : Daiva Nakayama, RN, CRRN     Current Status/Progress Goal Weekly Team Focus  Medical   1. Decreased functional mobility secondary to end-stage osteoarthritis bilateral knees.  Status post bilateral TKA 05/27/2017.   Improve mobility, safety, anxiety, transfers, pain, sleep  See above   Bowel/Bladder   continent of bowel and bladder, LBM 2-25  remain continent of bowel and bladder  Assist with tolieting needs   Swallow/Nutrition/ Hydration             ADL's   Supervision for bathing and dressing sit to stand.  Supervision for all transfers with use of the RW for support to the shower and the toilet.  supervision  selfcare retraining, balance retraining, transfer training, DME/AE education, neuromuscular re-education, pt/family education   Mobility   supervision for bed mobility, transfers, and gait up to 230ft. min assist for steps.   supervision, min assist for stairs   gait, knee ROM/strength, stairs, transfers   Communication             Safety/Cognition/ Behavioral Observations            Pain   pain managed with prn oxy 5-10mg  and ultram 50mg  prn  pain < or = 4  Assess pain q shift and prn   Skin   bilateral knees incision, steri strips in place   no new skin breakdown/incision  Assess skin q shift and prn      *See Care Plan and progress notes for long and short-term goals.     Barriers to Discharge  Current Status/Progress Possible Resolutions Date Resolved   Physician    Medical stability;Behavior     See above  Therapies, optimize pain meds, coping mechanisms      Nursing  PT                    OT                  SLP                SW                Discharge Planning/Teaching Needs:  Husband can be there and if not has hired an Engineer, production to provide supervision level. Pt working on her knee range and flexion      Team Discussion:  Progressing toward her goals and upgrading some to mod/i level. Pain still an issue and MD managing with adjusting meds. Working on ROM and knee flexion with PT. Left-73 degrees and right-84 degress  Revisions to Treatment Plan:  DC 3/2    Continued Need for Acute Rehabilitation Level of  Care: The patient requires daily medical management by a physician with specialized training in physical medicine and rehabilitation for the following conditions: Daily direction of a multidisciplinary physical rehabilitation program to ensure safe treatment while eliciting the highest outcome that is of practical value to the patient.: Yes Daily medical management of patient stability for increased activity during participation in an intensive rehabilitation regime.: Yes Daily analysis of laboratory values and/or radiology reports with any subsequent need for medication adjustment of medical intervention for : Post surgical problems;Mood/behavior problems  Kassondra Geil, Gardiner Rhyme 06/10/2017, 11:49 AM

## 2017-06-10 NOTE — Progress Notes (Signed)
[facility] PHYSICAL MEDICINE & REHABILITATION     PROGRESS NOTE  Subjective/Complaints:  Pt seen lying in bed.  She slept fairly overnight.  She has questions about discharge.   ROS: Denies CP, SOB, N/V/D.  Objective: Vital Signs: Blood pressure 116/72, pulse 92, temperature 98 F (36.7 C), temperature source Oral, resp. rate 18, height 5\' 5"  (1.651 m), weight 71 kg (156 lb 8.4 oz), last menstrual period 04/14/1997, SpO2 98 %. No results found. Recent Labs    06/09/17 0806 06/10/17 0640  WBC 6.6 4.5  HGB 9.4* 8.1*  HCT 29.6* 24.9*  PLT 621* 535*   No results for input(s): NA, K, CL, GLUCOSE, BUN, CREATININE, CALCIUM in the last 72 hours.  Invalid input(s): CO CBG (last 3)  No results for input(s): GLUCAP in the last 72 hours.  Wt Readings from Last 3 Encounters:  06/10/17 71 kg (156 lb 8.4 oz)  05/27/17 67.1 kg (148 lb)  05/22/17 67.1 kg (148 lb)    Physical Exam:  BP 116/72 (BP Location: Left Arm)   Pulse 92   Temp 98 F (36.7 C) (Oral)   Resp 18   Ht 5\' 5"  (1.651 m)   Wt 71 kg (156 lb 8.4 oz)   LMP 04/14/1997   SpO2 98%   BMI 26.05 kg/m  Constitutional: She appears well-developed. NAD. HENT: Normocephalic. Atraumatic.  Eyes: EOM are normal. No discharge.  Cardiovascular: RRR. No JVD. Respiratory: Effort normal and breath sounds normal. GI: Bowel sounds are normal. She exhibits no distension.  Musc: B/l knee tenderness with edema and tenderness Neurological.  Follows full commands.  Motor: B/l upper extremities 5/5 proximal to distal.  B/l lower extremities: hip flexors, knee extension 3+/5, ankle dorsi/plantar flexion 4+/5 Skin. Warm and dry bilateral knee incisions with some erythema along left knee incision. No drainage noted.  Psych: Anxious  Assessment/Plan: 1. 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 which require 3+ hours per day of interdisciplinary therapy in a comprehensive inpatient  rehab setting. Physiatrist is providing close team supervision and 24 hour management of active medical problems listed below. Physiatrist and rehab team continue to assess barriers to discharge/monitor patient progress toward functional and medical goals.  Function:  Bathing Bathing position   Position: Shower  Bathing parts Body parts bathed by patient: Right arm, Left arm, Abdomen, Chest, Right upper leg, Left upper leg, Right lower leg, Left lower leg, Front perineal area, Buttocks Body parts bathed by helper: Back, Left lower leg, Right lower leg  Bathing assist Assist Level: Supervision or verbal cues      Upper Body Dressing/Undressing Upper body dressing   What is the patient wearing?: Pull over shirt/dress, Bra Bra - Perfomed by patient: Thread/unthread right bra strap, Thread/unthread left bra strap, Hook/unhook bra (pull down sports bra)   Pull over shirt/dress - Perfomed by patient: Thread/unthread right sleeve, Thread/unthread left sleeve, Put head through opening, Pull shirt over trunk   Button up shirt - Perfomed by patient: Thread/unthread right sleeve, Thread/unthread left sleeve, Pull shirt around back, Button/unbutton shirt      Upper body assist Assist Level: Supervision or verbal cues   Set up : To obtain clothing/put away  Lower Body Dressing/Undressing Lower body dressing   What is the patient wearing?: Pants, Non-skid slipper socks, Ted Hose, Shoes     Pants- Performed by patient: Thread/unthread right pants leg, Thread/unthread left pants leg, Pull pants up/down Pants- Performed by helper: Thread/unthread left pants leg, Pull  pants up/down Non-skid slipper socks- Performed by patient: Don/doff right sock, Don/doff left sock Non-skid slipper socks- Performed by helper: Don/doff right sock, Don/doff left sock     Shoes - Performed by patient: Don/doff right shoe, Don/doff left shoe, Fasten right, Fasten left         TED Hose - Performed by helper:  Don/doff right TED hose, Don/doff left TED hose  Lower body assist Assist for lower body dressing: Supervision or verbal cues      Toileting Toileting   Toileting steps completed by patient: Adjust clothing prior to toileting, Adjust clothing after toileting Toileting steps completed by helper: Adjust clothing prior to toileting, Performs perineal hygiene, Adjust clothing after toileting    Toileting assist Assist level: Supervision or verbal cues   Transfers Chair/bed transfer   Chair/bed transfer method: Stand pivot Chair/bed transfer assist level: Supervision or verbal cues Chair/bed transfer assistive device: Armrests, Medical sales representative     Max distance: 85 ft Assist level: Supervision or verbal cues   Wheelchair   Type: Manual Max wheelchair distance: 100 ft Assist Level: Supervision or verbal cues  Cognition Comprehension Comprehension assist level: Follows complex conversation/direction with no assist  Expression Expression assist level: Expresses basic needs/ideas: With extra time/assistive device  Social Interaction Social Interaction assist level: Interacts appropriately with others - No medications needed.  Problem Solving Problem solving assist level: Solves complex problems: With extra time  Memory Memory assist level: Complete Independence: No helper    Medical Problem List and Plan:  1. Decreased functional mobility secondary to end-stage osteoarthritis bilateral knees. Status post bilateral TKA 05/27/2017. Weightbearing as tolerated.    Continue CIR  2. DVT Prophylaxis/Anticoagulation: Coumadin for DVT prophylaxis. Plan is for Coumadin 4 weeks then aspirin 81 mg daily    Vascular study negative for DVT   INR therapeutic on 2/27 3. Pain Management: Neurontin 300 mg 3 times a day, Valium and oxycodone as needed. Monitor narcotics for increased sedation    Scheduled ice to knees while awake, encouraged use again   Patient sensitive to  oxycodone, added when necessary low dose tramadol on 2/22, increased on 2/25   Poor coping mechanisms, cont to educate 4. Mood: Provide emotional support, with reactive depression   Neuropsych consult   Team to provide encouragement 5. Neuropsych: This patient is capable of making decisions on her own behalf.  6. Skin/Wound Care: Routine skin checks  7. Fluids/Electrolytes/Nutrition: Routine I&O's    BMP within acceptable range on 2/22 8. Acute blood loss anemia.    Hb 8.1 on 2/27   Cont to monitor 9. Chronic constipation. Continue Movantik, qod miralax, dulcolax suppository tonight    Hx of IBS  10.GERD.Prilosec              prn maalox 11. Tachycardia    Monitor in accordance with pain and increasing activity   Improving overall 12. Hypoalbuminemia   Supplement initiated on 2/20 13. Sleep disturbance   Trazodone as needed started on 2/25   Improving  LOS (Days) 8 A FACE TO FACE EVALUATION WAS PERFORMED  Ankit Lorie Phenix 06/10/2017 5:01 PM

## 2017-06-10 NOTE — Progress Notes (Signed)
Occupational Therapy Weekly Progress Note  Patient Details  Name: Sherry [name] MRN: [id number] Date of Birth: 1945/06/18  Beginning of progress report period: June 03, 2017 End of progress report period: June 10, 2017  Today's Date: 06/10/2017 OT Individual Time: 7588-3254 OT Individual Time Calculation (min): 57 min    Patient has met 4 of 4 short term goals.  Sherry [name] is making steady progress with OT.  She is able to complete all bathing and dressing as well as functional transfers at a supervision level.  She can complete all transfers with use of the RW and she does utilize the 3:1 and shower seat for toileting and for showering.  Reacher has been used for removing of clothing and donning pants over feet at times, otherwise he can complete all dressing with supervision only.  Feel she is on target for goals set at supervision, will update some to modified independent.    Patient continues to demonstrate the following deficits: muscle weakness and decreased standing balance and decreased balance strategies and therefore will continue to benefit from skilled OT intervention to enhance overall performance with BADL, iADL and Reduce care partner burden.  Patient progressing toward long term goals..  Continue plan of care.  OT Short Term Goals Week 2:  OT Short Term Goal 1 (Week 2): Continue working on established LTGs set at supervision to modified independent.   Skilled Therapeutic Interventions/Progress Updates:    Pt completed shower and dressing sit to stand during session.  She was able to gather clothing with the RW before bathing with supervision using the RW and with min instructional cueing to stay inside of the walker.  She completed all bathing in sitting with supervision, and lateral leans for washing peri area.  Dressing sit to stand from the EOB with supervision with use of rail as well.  Once completed she ambulated to the shower to pick up dirty clothing with  supervision and use of the reacher to pick items up off of the floor.  She also completed oral hygiene with supervision at the sink.  Still with increased pain in her LEs with static standing.  Finished session with ambulation down the hall to the ortho gym and then back to room with a slight rest in the gym.  Pt left in the bedside recliner with call button and phone in reach and ice packs in place.    Therapy Documentation Precautions:  Precautions Precautions: Knee, Fall Required Braces or Orthoses: Knee Immobilizer - Left, Knee Immobilizer - Right Knee Immobilizer - Right: Discontinue once straight leg raise with < 10 degree lag Knee Immobilizer - Left: Discontinue once straight leg raise with < 10 degree lag Restrictions Weight Bearing Restrictions: Yes RLE Weight Bearing: Weight bearing as tolerated LLE Weight Bearing: Weight bearing as tolerated   Pain: Pain Assessment Pain Assessment: 0-10 Pain Score: 7  Pain Type: Acute pain;Surgical pain Pain Location: Knee Pain Orientation: Right;Left Pain Descriptors / Indicators: Aching;Discomfort;Tightness Pain Frequency: Occasional Pain Onset: With Activity Patients Stated Pain Goal: 3 Pain Intervention(s): Medication (See eMAR) ADL: See Function Navigator for Current Functional Status.   Therapy/Group: Individual Therapy  Porter Moes OTR/L 06/10/2017, 10:04 AM

## 2017-06-10 NOTE — Progress Notes (Signed)
[facility] for Coumadin Indication: VTE prophylaxis s/p TKA   Allergies  Allergen Reactions  . Adhesive [Tape] Other (See Comments)    Blisters; "gets puffy and red" Liquid Bandaids    Patient Measurements: Height: 5\' 5"  (165.1 cm) Weight: 158 lb 11.7 oz (72 kg) IBW/kg (Calculated) : 57  Vital Signs: Temp: 98.7 F (37.1 C) (02/27 0357) Temp Source: Oral (02/27 0357) BP: 129/60 (02/27 0357) Pulse Rate: 80 (02/27 0357)  Labs: Recent Labs    06/08/17 0635 06/09/17 0806 06/10/17 0640  HGB 8.8* 9.4* 8.1*  HCT 28.0* 29.6* 24.9*  PLT 573* 621* 535*  LABPROT 24.2* 24.0* 24.8*  INR 2.20 2.17 2.26    Estimated Creatinine Clearance: 65.1 mL/min (by C-G formula based on SCr of 0.62 mg/dL).   Medical History: Past Medical History:  Diagnosis Date  . Anxiety   . Carpal tunnel syndrome on both sides   . Chronic back pain    herniated disc,stenosis,radiculopathy,spondylolisthesis  . Chronic neck pain   . Constipation, chronic   . Degenerative spinal arthritis    cervical and lumbar  . Depression   . Eczema   . GERD (gastroesophageal reflux disease)   . Hemorrhoids   . Hiatal hernia   . History of bronchitis    "I've had it a number of times"  . IBS (irritable bowel syndrome)    lactose intolerant  . Osteoarthritis    bilateral knees and shoulders,  little fingers  . TMJ (dislocation of temporomandibular joint)   . Wears glasses    Assessment: 17 YOF with history of BL osteoarthritis of the knees. Presented to [facility] on 2/13 for BL TKA. Patient not on anticoagulation prior to admission. Coumadin ordered post op for VTE prophylaxis. Ortho team plans to continue with Coumadin for 4 weeks (from 05/28/17), then transition to aspirin 81mg  daily.  INR today = therapeutic H/H low but stable, PLT wnl/high No bleeding noted  Goal of Therapy:   INR 2-3  Monitor for platelets: Yes  Plan:  Continue Warfarin 5 mg po daily at 1800  pm INR to MWF  Thank you, Anette Guarneri, PharmD [phone]

## 2017-06-10 NOTE — Progress Notes (Signed)
Physical Therapy Weekly Progress Note  Patient Details  Name: Sherry [name] MRN: [id number] Date of Birth: 11-07-1945  Beginning of progress report period: June 03, 2017 End of progress report period: June 10, 2017  Today's Date: 06/10/2017 PT Individual Time: 7989-2119, 1402-1530 PT Individual Time Calculation (min): 45 min , 88 min   Patient has met 2 of 3 short term goals.  Pt is progressing with all functional mobility, continues to be limited by decreased knee ROM secondary to B TKAs. Pt is lacking 10 deg from full extension bilaterally, Passive knee flexion to 73 deg on L and 84 deg on R.   Patient continues to demonstrate the following deficits muscle weakness and muscle joint tightness and decreased standing balance and decreased balance strategies and therefore will continue to benefit from skilled PT intervention to increase functional independence with mobility.  Patient progressing toward long term goals..  Continue plan of care.  PT Short Term Goals Week 1:  PT Short Term Goal 1 (Week 1): Pt will ambulate x 100 ft with RW and min assist PT Short Term Goal 1 - Progress (Week 1): Met PT Short Term Goal 2 (Week 1): Pt will perform sit<>stand with min assist PT Short Term Goal 2 - Progress (Week 1): Met PT Short Term Goal 3 (Week 1): Pt will perform x 10 straight leg raises bilaterally PT Short Term Goal 3 - Progress (Week 1): Progressing toward goal Week 2:  PT Short Term Goal 1 (Week 2): LTG = STG due to ELOS  Skilled Therapeutic Interventions/Progress Updates:    Session 1: Pt seated in recliner upon PT arrival, agreeable to therapy tx and reports pain 10/10 in B LEs. Pt transferred from sit>stand with supervision using RW and ambulated x 85 ft with RW and supervision, limited by pain. Propelled w/c the rest of the way to the gym with supervision using B UEs. Pt transferred from w/c>mat stand pivot with RW and supervision, transferred to supine with supervision. Pt  performed supine therex for ROM and strengthening: 2 x 10 heel slides, 2 x 10 LAQ, 2 x 10 assisted SLRs, 2 x 10 quad set. Pt transferred back to sitting and transferred to w/c with supervision. Pt transported back to room and transferred to recliner with supervision and RW. Pt left seated in recliner with needs in reach, ice applied for swelling and pain relief.   Session 2: Pt supine in bed upon PT arrival, agreeable to therapy tx and reports pain 6/10 in B LEs. Pt transferred from sit>stand with supervision using RW and ambulated x 200 ft with RW and supervision from room>gym. Pt ascended/descended 12 steps with single R handrail, min assist, step to pattern, verbal cues for techniques including both hands on single rail. Pt worked on knee flexion stretch in sitting 2 x 1 min each side, therapist performed manual knee extension stretch 2 x 1 min and pt performed 2 x 10 LAQ for strengthening. Pt ambulated from gym>dayroom x 200 ft with RW and supervision, verbal cues for increased flexion with swing phase. Pt used nustep x 10 minutes on workload 3, LEs only for ROM and strengthening. Pt performed 2 x 10 mini squat in standing with emphasis on knee flexion ROM. Pt performed seated heel slides x 20 and seated knee flexion stretch 2 x 30 sec. Pt transported back to room and transferred from w/c>bed with supervision. Pt connected to continuous passive motion machines bilaterally, set from 0-70 degrees with L LE and 0-80 degrees  on R LE.   Therapy Documentation Precautions:  Precautions Precautions: Knee, Fall Required Braces or Orthoses: Knee Immobilizer - Left, Knee Immobilizer - Right Knee Immobilizer - Right: Discontinue once straight leg raise with < 10 degree lag Knee Immobilizer - Left: Discontinue once straight leg raise with < 10 degree lag Restrictions Weight Bearing Restrictions: Yes RLE Weight Bearing: Weight bearing as tolerated LLE Weight Bearing: Weight bearing as tolerated   See  Function Navigator for Current Functional Status.  Therapy/Group: Individual Therapy  Netta Corrigan, PT, DPT 06/10/2017, 10:47 AM

## 2017-06-10 NOTE — Progress Notes (Signed)
Social Work Patient ID: Sherry [name], female   DOB: 05-18-1945, 72 y.o.   MRN: [id number]  Met with pt to discuss team conference goals-supervision-mod/i level and discharge 3/2. She feels her pain is her main issues along with her ROM and knee flexion. She is working on this in therapies. She is agreeable to the equipment and home health needs. Work toward discharge Sat. See if she needs home CPM's.

## 2017-06-11 ENCOUNTER — Inpatient Hospital Stay (HOSPITAL_COMMUNITY): Payer: Medicare Other | Admitting: Physical Therapy

## 2017-06-11 ENCOUNTER — Inpatient Hospital Stay (HOSPITAL_COMMUNITY): Payer: Medicare Other | Admitting: Occupational Therapy

## 2017-06-11 LAB — CBC
HCT: 29.9 % — ABNORMAL LOW (ref 36.0–46.0)
Hemoglobin: 9.4 g/dL — ABNORMAL LOW (ref 12.0–15.0)
MCH: 30.4 pg (ref 26.0–34.0)
MCHC: 31.4 g/dL (ref 30.0–36.0)
MCV: 96.8 fL (ref 78.0–100.0)
Platelets: 692 10*3/uL — ABNORMAL HIGH (ref 150–400)
RBC: 3.09 MIL/uL — ABNORMAL LOW (ref 3.87–5.11)
RDW: 14.7 % (ref 11.5–15.5)
WBC: 6.3 10*3/uL (ref 4.0–10.5)

## 2017-06-11 NOTE — Plan of Care (Signed)
  Progressing RH BOWEL ELIMINATION RH STG MANAGE BOWEL WITH ASSISTANCE Description STG Manage Bowel with [place].  06/11/2017 1152 - Progressing by Claude Manges, LPN RH STG MANAGE BOWEL W/MEDICATION W/ASSISTANCE Description STG Manage Bowel with Medication with [place].  06/11/2017 1152 - Progressing by Claude Manges, LPN RH BLADDER ELIMINATION RH STG MANAGE BLADDER WITH ASSISTANCE Description STG Manage Bladder With Min Assistance  06/11/2017 1152 - Progressing by Claude Manges, LPN [facility] SKIN INTEGRITY RH STG SKIN FREE OF INFECTION/BREAKDOWN 06/11/2017 1152 - Progressing by Claude Manges, LPN RH STG MAINTAIN SKIN INTEGRITY WITH ASSISTANCE Description STG Maintain Skin Integrity With [place].  06/11/2017 1152 - Progressing by Claude Manges, LPN RH STG ABLE TO PERFORM INCISION/WOUND CARE W/ASSISTANCE Description STG Able To Perform Incision/Wound Care With [place].  06/11/2017 1152 - Progressing by Claude Manges, LPN RH SAFETY RH STG ADHERE TO SAFETY PRECAUTIONS W/ASSISTANCE/DEVICE Description STG Adhere to Safety Precautions With Min Assistance/Device.  06/11/2017 1152 - Progressing by Claude Manges, LPN RH STG DECREASED RISK OF FALL WITH ASSISTANCE Description STG Decreased Risk of Fall With [employer].  06/11/2017 1152 - Progressing by Claude Manges, LPN

## 2017-06-11 NOTE — Progress Notes (Signed)
[place] PHYSICAL MEDICINE & REHABILITATION     PROGRESS NOTE  Subjective/Complaints:  Patient seen lying in bed this morning. She states she slept well overnight. She wants to know if she will be okay for discharge on Saturday.  ROS: Denies CP, SOB, N/V/D.  Objective: Vital Signs: Blood pressure 137/67, pulse 94, temperature 98.5 F (36.9 C), temperature source Oral, resp. rate 18, height 5\' 5"  (1.651 m), weight 71 kg (156 lb 8.4 oz), last menstrual period 04/14/1997, SpO2 94 %. No results found. Recent Labs    06/09/17 0806 06/10/17 0640  WBC 6.6 4.5  HGB 9.4* 8.1*  HCT 29.6* 24.9*  PLT 621* 535*   No results for input(s): NA, K, CL, GLUCOSE, BUN, CREATININE, CALCIUM in the last 72 hours.  Invalid input(s): CO CBG (last 3)  No results for input(s): GLUCAP in the last 72 hours.  Wt Readings from Last 3 Encounters:  06/10/17 71 kg (156 lb 8.4 oz)  05/27/17 67.1 kg (148 lb)  05/22/17 67.1 kg (148 lb)    Physical Exam:  BP 137/67 (BP Location: Left Arm)   Pulse 94   Temp 98.5 F (36.9 C) (Oral)   Resp 18   Ht 5\' 5"  (1.651 m)   Wt 71 kg (156 lb 8.4 oz)   LMP 04/14/1997   SpO2 94%   BMI 26.05 kg/m  Constitutional: She appears well-developed. NAD. HENT: Normocephalic. Atraumatic.  Eyes: EOM are normal. No discharge.  Cardiovascular: RRR. No JVD. Respiratory: Effort normal and breath sounds normal. GI: Bowel sounds are normal. She exhibits no distension.  Musc: B/l knee tenderness with edema and tenderness Neurological.  Follows full commands.  Motor: B/l upper extremities 5/5 proximal to distal.  B/l lower extremities: hip flexors, knee extension 4 -/5, ankle dorsi/plantar flexion 4+/5  Skin. Warm and dry bilateral knee incisions with some erythema along left knee incision. No drainage noted.  Psych: Anxious  Assessment/Plan: 1. 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 Physiatrist is providing close team supervision and 24 hour management of active medical problems listed below. Physiatrist and rehab team continue to assess barriers to discharge/monitor patient progress toward functional and medical goals.  Function:  Bathing Bathing position   Position: Shower  Bathing parts Body parts bathed by patient: Right arm, Left arm, Abdomen, Chest, Right upper leg, Left upper leg, Right lower leg, Left lower leg, Front perineal area, Buttocks Body parts bathed by helper: Back, Left lower leg, Right lower leg  Bathing assist Assist Level: Supervision or verbal cues      Upper Body Dressing/Undressing Upper body dressing   What is the patient wearing?: Pull over shirt/dress, Bra Bra - Perfomed by patient: Thread/unthread right bra strap, Thread/unthread left bra strap, Hook/unhook bra (pull down sports bra)   Pull over shirt/dress - Perfomed by patient: Thread/unthread right sleeve, Thread/unthread left sleeve, Put head through opening, Pull shirt over trunk   Button up shirt - Perfomed by patient: Thread/unthread right sleeve, Thread/unthread left sleeve, Pull shirt around back, Button/unbutton shirt      Upper body assist Assist Level: Supervision or verbal cues   Set up : To obtain clothing/put away  Lower Body Dressing/Undressing Lower body dressing   What is the patient wearing?: Pants, Non-skid slipper socks, Ted Hose, Shoes     Pants- Performed by patient: Thread/unthread right pants leg, Thread/unthread left pants leg, Pull  pants up/down Pants- Performed by helper: Thread/unthread left pants leg, Pull pants up/down Non-skid slipper socks- Performed by patient: Don/doff right sock, Don/doff left sock Non-skid slipper socks- Performed by helper: Don/doff right sock, Don/doff left sock     Shoes - Performed by patient: Don/doff right shoe, Don/doff left shoe, Fasten right,  Fasten left         TED Hose - Performed by helper: Don/doff right TED hose, Don/doff left TED hose  Lower body assist Assist for lower body dressing: Supervision or verbal cues      Toileting Toileting   Toileting steps completed by patient: Adjust clothing prior to toileting, Adjust clothing after toileting Toileting steps completed by helper: Adjust clothing prior to toileting, Performs perineal hygiene, Adjust clothing after toileting Toileting Assistive Devices: Grab bar or rail  Toileting assist Assist level: Supervision or verbal cues   Transfers Chair/bed transfer   Chair/bed transfer method: Stand pivot Chair/bed transfer assist level: Supervision or verbal cues Chair/bed transfer assistive device: Armrests, Medical sales representative     Max distance: 85 ft Assist level: Supervision or verbal cues   Wheelchair   Type: Manual Max wheelchair distance: 100 ft Assist Level: Supervision or verbal cues  Cognition Comprehension Comprehension assist level: Follows complex conversation/direction with no assist  Expression Expression assist level: Expresses basic needs/ideas: With extra time/assistive device  Social Interaction Social Interaction assist level: Interacts appropriately with others - No medications needed.  Problem Solving Problem solving assist level: Solves complex problems: With extra time  Memory Memory assist level: Complete Independence: No helper    Medical Problem List and Plan:  1. Decreased functional mobility secondary to end-stage osteoarthritis bilateral knees. Status post bilateral TKA 05/27/2017. Weightbearing as tolerated.    Continue CIR  2. DVT Prophylaxis/Anticoagulation: Coumadin for DVT prophylaxis. Plan is for Coumadin 4 weeks then aspirin 81 mg daily    Vascular study negative for DVT   INR therapeutic on 2/27, pending today 3. Pain Management: Neurontin 300 mg 3 times a day, Valium and oxycodone as needed. Monitor narcotics  for increased sedation    Scheduled ice to knees while awake, encouraged use again   Patient sensitive to oxycodone, added when necessary low dose tramadol on 2/22, increased on 2/25   Poor coping mechanisms, cont to educate 4. Mood: Provide emotional support, with reactive depression   Neuropsych consult   Team to provide encouragement 5. Neuropsych: This patient is capable of making decisions on her own behalf.  6. Skin/Wound Care: Routine skin checks  7. Fluids/Electrolytes/Nutrition: Routine I&O's    BMP within acceptable range on 2/22   Labs ordered for tomorrow 8. Acute blood loss anemia.    Hb 8.1 on 2/27   Labs pending   Cont to monitor 9. Chronic constipation. Continue Movantik, qod miralax, dulcolax suppository tonight    Hx of IBS  10.GERD.Prilosec              prn maalox 11. Tachycardia    Monitor in accordance with pain and increasing activity   Improving overall 12. Hypoalbuminemia   Supplement initiated on 2/20 13. Sleep disturbance   Trazodone as needed started on 2/25   Improving  LOS (Days) 9 A FACE TO FACE EVALUATION WAS PERFORMED  Sherry [name] 06/11/2017 8:57 AM

## 2017-06-11 NOTE — Progress Notes (Signed)
Physical Therapy Session Note  Patient Details  Name: KYNDLE SCHLENDER MRN: [id number] Date of Birth: [birth year]  Today's Date: 06/11/2017 PT Individual Time: 1537-9432 AND 1600-1656 PT Individual Time Calculation (min): 75 min AND 60 min  Short Term Goals: Week 2:  PT Short Term Goal 1 (Week 2): LTG = STG due to ELOS  Skilled Therapeutic Interventions/Progress Updates:   Session 1:  Pt in recliner and agreeable to therapy, c/o pain as detailed below. Pt transferred to w/c via stand pivot w/ RW and supervision. Pt self-propelled w/c to/from therapy gym to work on general strength and conditioning. Performed warm-up on NuStep 10 min in total @ L1 for LE strengthening and knee ROM. 5 min w/ UE assist, 5 min w/o UE assist. Performed seated BLE strengthening exercises including LAQs 2x10, heel slides 2x10, heel raises 2x10, and toe raises 2x10. Therapist performed static knee flexion and knee extension stretch to work on ROM, 30 sec x3 on each leg in each position. Returned to room in w/c and transferred to EOB and to supine w/ supervision. Ended session in supine, call bell within reach and all needs met.   Session 2:  Pt in supine and agreeable to therapy, c/o 9/10 pain in bilateral knees. Total assist to remove LEs from CPMs. Transferred to EOB and ambulated to/from toilet w/ supervision. Supervision for sit<>stands to toilet riser and for pericare. Worked on overall endurance and tolerance for ambulation this session. Ambulated 100' and >500' w/ RW and supervision. Verbal cues for gait pattern and to decreased stiff knee gait. Performed standing BLE strengthening exercises including hip abduction 2x10, hip flexion 2x10, partial knee bends 2x10, and TKEs 2x10. Static knee extension stretch during seated rest breaks. Returned to room in w/c w/ supervision and ended session in recliner w/ ice applied to both knees, call bell within reach and all needs met.   Therapy Documentation Precautions:   Precautions Precautions: Knee, Fall Required Braces or Orthoses: Knee Immobilizer - Left, Knee Immobilizer - Right Knee Immobilizer - Right: Discontinue once straight leg raise with < 10 degree lag Knee Immobilizer - Left: Discontinue once straight leg raise with < 10 degree lag Restrictions Weight Bearing Restrictions: Yes RLE Weight Bearing: Weight bearing as tolerated LLE Weight Bearing: Weight bearing as tolerated Pain: Pain Assessment Pain Assessment: 0-10 Pain Score: 8  Pain Location: Knee Pain Orientation: Left;Right Pain Descriptors / Indicators: Aching Pain Onset: With Activity Patients Stated Pain Goal: 3 Pain Intervention(s): Medication (See eMAR)  See Function Navigator for Current Functional Status.   Therapy/Group: Individual Therapy  Kaileb Monsanto K Arnette 06/11/2017, 12:48 PM

## 2017-06-11 NOTE — Progress Notes (Signed)
Occupational Therapy Session Note  Patient Details  Name: Sherry [name] MRN: [id number] Date of Birth: 1945-08-09  Today's Date: 06/11/2017 OT Individual Time: 6387-5643 OT Individual Time Calculation (min): 55 min    Short Term Goals: Week 2:  OT Short Term Goal 1 (Week 2): Continue working on established LTGs set at supervision to modified independent.   Skilled Therapeutic Interventions/Progress Updates:    Pt completed shower and dressing sit to stand during session.  She was able to gather clothing with the RW before bathing with supervision using the RW.  She completed all bathing in sitting with supervision, and lateral leans for washing peri area.  Dressing sit to stand from the EOB with supervision with use of rail as well except for donning TEDs, which therapist assisted with.  She used the reacher for assistance with donning shoes over feet but could complete all other dressing without AE.  Once completed she ambulated to the closet to place dirty clothing with supervision  She then completed oral hygiene with supervision at the sink.  Still with increased pain in her LEs with static standing.  Finished session with ambulation down the hall to the ortho gym to place old ice packs back in the freezer.  Pt had 2-3 minute rest break in the gym for returning back to the room.  Supervision for all mobility as well.  Pt left in the bedside recliner with call button and phone in reach.    Therapy Documentation Precautions:  Precautions Precautions: Knee, Fall Required Braces or Orthoses: Knee Immobilizer - Left, Knee Immobilizer - Right Knee Immobilizer - Right: Discontinue once straight leg raise with < 10 degree lag Knee Immobilizer - Left: Discontinue once straight leg raise with < 10 degree lag Restrictions Weight Bearing Restrictions: No RLE Weight Bearing: Weight bearing as tolerated LLE Weight Bearing: Weight bearing as tolerated   Pain: Pain Assessment Pain Assessment:  Faces Pain Score: 4  Faces Pain Scale: Hurts little more Pain Type: Surgical pain Pain Location: Knee Pain Orientation: Right;Left Pain Descriptors / Indicators: Aching Pain Onset: With Activity Pain Intervention(s): Emotional support;Repositioned ADL: See Function Navigator for Current Functional Status.   Therapy/Group: Individual Therapy  Seini Lannom OTR/L 06/11/2017, 10:00 AM

## 2017-06-12 ENCOUNTER — Inpatient Hospital Stay (HOSPITAL_COMMUNITY): Payer: Medicare Other | Admitting: Physical Therapy

## 2017-06-12 ENCOUNTER — Inpatient Hospital Stay (HOSPITAL_COMMUNITY): Payer: Medicare Other | Admitting: Occupational Therapy

## 2017-06-12 DIAGNOSIS — Z7901 Long term (current) use of anticoagulants: Secondary | ICD-10-CM

## 2017-06-12 LAB — PROTIME-INR
INR: 2.29
Prothrombin Time: 25 seconds — ABNORMAL HIGH (ref 11.4–15.2)

## 2017-06-12 LAB — BASIC METABOLIC PANEL
Anion gap: 10 (ref 5–15)
BUN: 13 mg/dL (ref 6–20)
CO2: 24 mmol/L (ref 22–32)
Calcium: 8.8 mg/dL — ABNORMAL LOW (ref 8.9–10.3)
Chloride: 102 mmol/L (ref 101–111)
Creatinine, Ser: 0.65 mg/dL (ref 0.44–1.00)
GFR calc Af Amer: >60 mL/min (ref >60)
GFR calc non Af Amer: >60 mL/min (ref >60)
Glucose, Bld: 100 mg/dL — ABNORMAL HIGH (ref 65–99)
Potassium: 4 mmol/L (ref 3.5–5.1)
Sodium: 136 mmol/L (ref 135–145)

## 2017-06-12 MED ORDER — TRAMADOL HCL 50 MG PO TABS: 50.0000 mg | ORAL_TABLET | ORAL | 0 refills | Status: DC | PRN

## 2017-06-12 MED ORDER — WARFARIN SODIUM 5 MG PO TABS: 5.0000 mg | ORAL_TABLET | Freq: Every day | ORAL | 0 refills | Status: DC

## 2017-06-12 MED ORDER — DIAZEPAM 5 MG PO TABS: 5.0000 mg | ORAL_TABLET | Freq: Four times a day (QID) | ORAL | 0 refills | Status: DC | PRN

## 2017-06-12 MED ORDER — DICYCLOMINE HCL 10 MG PO CAPS: 10.0000 mg | ORAL_CAPSULE | Freq: Three times a day (TID) | ORAL | 0 refills | Status: DC | PRN

## 2017-06-12 MED ORDER — NALOXEGOL OXALATE 25 MG PO TABS: 25.0000 mg | ORAL_TABLET | Freq: Every day | ORAL | 0 refills | Status: DC

## 2017-06-12 MED ORDER — GABAPENTIN 300 MG PO CAPS: 300.0000 mg | ORAL_CAPSULE | Freq: Three times a day (TID) | ORAL | 0 refills | Status: DC

## 2017-06-12 MED ORDER — OXYCODONE HCL 10 MG PO TABS: 10.0000 mg | ORAL_TABLET | Freq: Three times a day (TID) | ORAL | 0 refills | Status: DC | PRN

## 2017-06-12 MED ORDER — TRAZODONE HCL 50 MG PO TABS: 50.0000 mg | ORAL_TABLET | Freq: Every day | ORAL | 0 refills | Status: DC

## 2017-06-12 NOTE — Progress Notes (Signed)
[facility] for Coumadin Indication: VTE prophylaxis s/p TKA   Allergies  Allergen Reactions  . Adhesive [Tape] Other (See Comments)    Blisters; "gets puffy and red" Liquid Bandaids    Patient Measurements: Height: 5\' 5"  (165.1 cm) Weight: 156 lb 8.4 oz (71 kg) IBW/kg (Calculated) : 57  Vital Signs: Temp: 98.2 F (36.8 C) (03/01 0335) Temp Source: Oral (03/01 0335) BP: 134/70 (03/01 0335) Pulse Rate: 99 (03/01 0335)  Labs: Recent Labs    06/10/17 0640 06/11/17 0857 06/12/17 0604  HGB 8.1* 9.4*  --   HCT 24.9* 29.9*  --   PLT 535* 692*  --   LABPROT 24.8*  --  25.0*  INR 2.26  --  2.29  CREATININE  --   --  0.65    Estimated Creatinine Clearance: 64.7 mL/min (by C-G formula based on SCr of 0.65 mg/dL).   Medical History: Past Medical History:  Diagnosis Date  . Anxiety   . Carpal tunnel syndrome on both sides   . Chronic back pain    herniated disc,stenosis,radiculopathy,spondylolisthesis  . Chronic neck pain   . Constipation, chronic   . Degenerative spinal arthritis    cervical and lumbar  . Depression   . Eczema   . GERD (gastroesophageal reflux disease)   . Hemorrhoids   . Hiatal hernia   . History of bronchitis    "I've had it a number of times"  . IBS (irritable bowel syndrome)    lactose intolerant  . Osteoarthritis    bilateral knees and shoulders,  little fingers  . TMJ (dislocation of temporomandibular joint)   . Wears glasses    Assessment: 28 YOF with history of BL osteoarthritis of the knees. Presented to [facility] on 2/13 for BL TKA. Patient not on anticoagulation prior to admission. Coumadin ordered post op for VTE prophylaxis. Ortho team plans to continue with Coumadin for 4 weeks (from 05/28/17), then transition to aspirin 81mg  daily.  INR today = 2.29, therapeutic H/H low but stable, PLT wnl/high on 2/28. No bleeding noted  Goal of Therapy:   INR 2-3  Monitor for platelets: Yes  Plan:   Continue Warfarin 5 mg po daily at 1800 pm INR to MWF  Thank you, Nicole Cella, RPh Clinical Pharmacist Pager: [phone] Phone: [phone] 8a-330p 3:30p-1030p [phone] or call main pharmacy [phone]

## 2017-06-12 NOTE — Discharge Summary (Signed)
Discharge summary job [phone]

## 2017-06-12 NOTE — Progress Notes (Signed)
[place] PHYSICAL MEDICINE & REHABILITATION     PROGRESS NOTE  Subjective/Complaints:  Sitting up in bed this morning. She states she slept well overnight. She has several questions regarding discharged tomorrow, including equipment, medications, prognosis, home health therapies, home health nursing, etc. She requires reassurance.  ROS: Denies CP, SOB, N/V/D.  Objective: Vital Signs: Blood pressure 134/70, pulse 99, temperature 98.2 F (36.8 C), temperature source Oral, resp. rate 12, height 5\' 5"  (1.651 m), weight 71 kg (156 lb 8.4 oz), last menstrual period 04/14/1997, SpO2 98 %. No results found. Recent Labs    06/10/17 0640 06/11/17 0857  WBC 4.5 6.3  HGB 8.1* 9.4*  HCT 24.9* 29.9*  PLT 535* 692*   Recent Labs    06/12/17 0604  NA 136  K 4.0  CL 102  GLUCOSE 100*  BUN 13  CREATININE 0.65  CALCIUM 8.8*   CBG (last 3)  No results for input(s): GLUCAP in the last 72 hours.  Wt Readings from Last 3 Encounters:  06/10/17 71 kg (156 lb 8.4 oz)  05/27/17 67.1 kg (148 lb)  05/22/17 67.1 kg (148 lb)    Physical Exam:  BP 134/70 (BP Location: Right Arm)   Pulse 99   Temp 98.2 F (36.8 C) (Oral)   Resp 12   Ht 5\' 5"  (1.651 m)   Wt 71 kg (156 lb 8.4 oz)   LMP 04/14/1997   SpO2 98%   BMI 26.05 kg/m  Constitutional: She appears well-developed. NAD. HENT: Normocephalic. Atraumatic.  Eyes: EOM are normal. No discharge.  Cardiovascular: RRR. No JVD. Respiratory: Effort normal and breath sounds normal. GI: Bowel sounds are normal. She exhibits no distension.  Musc: B/l knee tenderness with edema and tenderness Neurological.  Follows full commands.  Motor: B/l upper extremities 5/5 proximal to distal.  B/l lower extremities: hip flexors, knee extension 4 -/5, ankle dorsi/plantar flexion 4+/5 (slowly improving) Skin. Warm and dry bilateral knee incisions with some erythema along left knee incision. No drainage noted.  Psych: Anxious  Assessment/Plan: 1.  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 Physiatrist is providing close team supervision and 24 hour management of active medical problems listed below. Physiatrist and rehab team continue to assess barriers to discharge/monitor patient progress toward functional and medical goals.  Function:  Bathing Bathing position   Position: Shower  Bathing parts Body parts bathed by patient: Right arm, Left arm, Abdomen, Chest, Right upper leg, Left upper leg, Right lower leg, Left lower leg, Front perineal area, Buttocks Body parts bathed by helper: Back, Left lower leg, Right lower leg  Bathing assist Assist Level: Supervision or verbal cues      Upper Body Dressing/Undressing Upper body dressing   What is the patient wearing?: Pull over shirt/dress, Bra Bra - Perfomed by patient: Thread/unthread right bra strap, Thread/unthread left bra strap, Hook/unhook bra (pull down sports bra)   Pull over shirt/dress - Perfomed by patient: Thread/unthread right sleeve, Thread/unthread left sleeve, Put head through opening, Pull shirt over trunk   Button up shirt - Perfomed by patient: Thread/unthread right sleeve, Thread/unthread left sleeve, Pull shirt around back, Button/unbutton shirt      Upper body assist Assist Level: Set up   Set up : To obtain clothing/put away  Lower Body Dressing/Undressing Lower body dressing   What is the patient wearing?: Pants, Non-skid slipper socks, [employer], Shoes  Pants- Performed by patient: Thread/unthread right pants leg, Thread/unthread left pants leg, Pull pants up/down Pants- Performed by helper: Thread/unthread left pants leg, Pull pants up/down Non-skid slipper socks- Performed by patient: Don/doff right sock, Don/doff left sock Non-skid slipper socks- Performed by helper: Don/doff right sock,  Don/doff left sock     Shoes - Performed by patient: Don/doff right shoe, Don/doff left shoe, Fasten right, Fasten left         TED Hose - Performed by helper: Don/doff right TED hose, Don/doff left TED hose  Lower body assist Assist for lower body dressing: Supervision or verbal cues      Toileting Toileting   Toileting steps completed by patient: Adjust clothing prior to toileting, Performs perineal hygiene, Adjust clothing after toileting Toileting steps completed by helper: Adjust clothing prior to toileting, Performs perineal hygiene, Adjust clothing after toileting Toileting Assistive Devices: Grab bar or rail  Toileting assist Assist level: Supervision or verbal cues   Transfers Chair/bed transfer   Chair/bed transfer method: Stand pivot, Ambulatory Chair/bed transfer assist level: Supervision or verbal cues Chair/bed transfer assistive device: Armrests, Medical sales representative     Max distance: 500' Assist level: Supervision or verbal cues   Wheelchair   Type: Manual Max wheelchair distance: 150' Assist Level: Supervision or verbal cues  Cognition Comprehension Comprehension assist level: Follows complex conversation/direction with no assist  Expression Expression assist level: Expresses basic needs/ideas: With extra time/assistive device  Social Interaction Social Interaction assist level: Interacts appropriately with others - No medications needed.  Problem Solving Problem solving assist level: Solves complex problems: With extra time  Memory Memory assist level: Complete Independence: No helper    Medical Problem List and Plan:  1. Decreased functional mobility secondary to end-stage osteoarthritis bilateral knees. Status post bilateral TKA 05/27/2017. Weightbearing as tolerated.    Continue CIR, plan DC tomorrow   Will see patient in one month for hospital follow-up  2. DVT Prophylaxis/Anticoagulation: Coumadin for DVT prophylaxis. Plan is for  Coumadin 4 weeks then aspirin 81 mg daily    Vascular study negative for DVT   INR therapeutic on 3/1 3. Pain Management: Neurontin 300 mg 3 times a day, Valium and oxycodone as needed. Monitor narcotics for increased sedation    Scheduled ice to knees while awake, encouraged use again   Patient sensitive to oxycodone, added when necessary low dose tramadol on 2/22, increased on 2/25   Poor coping mechanisms, cont to educate 4. Mood: Provide emotional support, with reactive depression   Team to provide encouragement, requires emotional support 5. Neuropsych: This patient is capable of making decisions on her own behalf.  6. Skin/Wound Care: Routine skin checks  7. Fluids/Electrolytes/Nutrition: Routine I&O's    BMP within acceptable range on 3/1 8. Acute blood loss anemia.    Hb 9.4 on 2/28   Cont to monitor 9. Chronic constipation. Continue Movantik, qod miralax, dulcolax suppository tonight    Hx of IBS  10.GERD.Prilosec              prn maalox 11. Tachycardia    Monitor in accordance with pain and increasing activity   Improving overall 12. Hypoalbuminemia   Supplement initiated on 2/20 13. Sleep disturbance   Trazodone as needed started on 2/25   Improving  LOS (Days) 10 A FACE TO FACE EVALUATION WAS PERFORMED  Caroline Matters Lorie Phenix 06/12/2017 9:10 AM

## 2017-06-12 NOTE — Progress Notes (Signed)
Occupational Therapy Discharge Summary  Patient Details  Name: Sherry [name] MRN: [id number] Date of Birth: 06-13-1945  Today's Date: 06/12/2017 OT Individual Time: 1301-1401 OT Individual Time Calculation (min): 60 min   Session Note:  Pt completed shower and dressing during session with pt's spouse present for education.  She was able to complete all aspects of bathing and dressing with overall supervision to modified independent level.  She used the shower seat for bathing, with lateral leans to wash peri area.  She completed dressing sit to stand at the EOB with supervision, including donning TEDs and shoes, without any AE this session.  She finished session with functional mobility to the ortho gym and back to retrieve her ice packs.  Pt left in wheelchair with call button and phone in reach.    Patient has met 11 of 11 long term goals due to improved balance and ability to compensate for deficits.  Patient to discharge at overall Modified Independent level.  Patient's care partner is independent to provide the necessary physical assistance at discharge.    Reasons goals not met: NA  Recommendation:  Feel pt does not need follow-up OT and with have PRN supervision for follow-up  Equipment: none  Reasons for discharge: treatment goals met and discharge from hospital  Patient/family agrees with progress made and goals achieved: Yes  OT Discharge Precautions/Restrictions  Restrictions Weight Bearing Restrictions: Yes RLE Weight Bearing: Weight bearing as tolerated LLE Weight Bearing: Weight bearing as tolerated  Pain Pain Assessment Pain Assessment: Faces Pain Score: 6  Faces Pain Scale: Hurts little more Pain Type: Surgical pain Pain Location: Knee Pain Orientation: Right;Left Pain Descriptors / Indicators: Discomfort Pain Onset: With Activity Pain Intervention(s): Repositioned ADL  See Function Section of chart for details  Vision Baseline Vision/History: No visual  deficits;Wears glasses Wears Glasses: At all times Patient Visual Report: No change from baseline Vision Assessment?: No apparent visual deficits Perception  Perception: Within Functional Limits Praxis Praxis: Intact Cognition Overall Cognitive Status: Within Functional Limits for tasks assessed Arousal/Alertness: Awake/alert Orientation Level: Oriented X4 Attention: Selective Sustained Attention: Appears intact Selective Attention: Appears intact Memory: Appears intact Awareness: Appears intact Problem Solving: Appears intact Safety/Judgment: Appears intact Sensation Sensation Light Touch: Appears Intact Stereognosis: Appears Intact Hot/Cold: Appears Intact Proprioception: Appears Intact Coordination Gross Motor Movements are Fluid and Coordinated: Yes Fine Motor Movements are Fluid and Coordinated: Yes Motor  Motor Motor: Within Functional Limits Mobility  Transfers Sit to Stand: 6: Modified independent (Device/Increase time) Stand to Sit: 6: Modified independent (Device/Increase time)  Trunk/Postural Assessment  Cervical Assessment Cervical Assessment: Within Functional Limits Thoracic Assessment Thoracic Assessment: Within Functional Limits Lumbar Assessment Lumbar Assessment: Within Functional Limits Postural Control Postural Control: (Flexed trunk in standing and during mobility)  Balance Balance Balance Assessed: Yes Static Sitting Balance Static Sitting - Level of Assistance: 6: Modified independent (Device/Increase time) Dynamic Sitting Balance Dynamic Sitting - Balance Support: During functional activity;No upper extremity supported Dynamic Sitting - Level of Assistance: 6: Modified independent (Device/Increase time) Static Standing Balance Static Standing - Balance Support: During functional activity Static Standing - Level of Assistance: 6: Modified independent (Device/Increase time) Dynamic Standing Balance Dynamic Standing - Balance Support:  During functional activity Dynamic Standing - Level of Assistance: 6: Modified independent (Device/Increase time) Extremity/Trunk Assessment RUE Assessment RUE Assessment: Within Functional Limits LUE Assessment LUE Assessment: Within Functional Limits   See Function Navigator for Current Functional Status.  Fouad Taul OTR/L 06/12/2017, 4:13 PM

## 2017-06-12 NOTE — Progress Notes (Signed)
Physical Therapy Discharge Summary  Patient Details  Name: Sherry [name] MRN: [id number] Date of Birth: 02-Sep-1945  Today's Date: 06/12/2017 PT Individual Time: 0900-1005 AND 1400-1500 PT Individual Time Calculation (min): 65 min AND 60 min  Session 1:  Pt in w/c and agreeable to therapy, c/o pain as detailed below. Performed functional mobility as outlined in d/c summary below including w/c mobility, gait, stairs, transfers, bed mobility, and car transfers. Additionally educated pt on importance on continuing to work on bilateral knee ROM using static knee extension stretching and heel slides w/ brief hold at end range to work on both flexion and extension. Pt verbalized understanding and demo-ed stretches correctly. Returned to room and ended session in supine w/ ice applied to both knees. Call bell within reach and all needs met.   Session 2:  Pt in recliner and agreeable to therapy, c/o pain as detailed below. Husband present observing therapy session. Pt ambulated 50-150' bouts w/ RW and supervision in addition to negotiating 4 steps w/ min guard. Educated husband on appropriate level assist in addition to pt's CLOF and endurance level. Reiterated maintaining same activity level pt has been performing while in rehab and slowly increasing that. Discussed same stretches as discussed w/ pt in previous session. Husband and pt verbalized understanding and in agreement. Returned to room and assisted pt w/ donning CPMs to 80 deg bilaterally. Pt resting comfortably and tolerating movement. Ended session in supine, call bell within reach and all needs met.   Patient has met 8 of 8 long term goals due to improved activity tolerance, increased strength, increased range of motion, decreased pain and ability to compensate for deficits.  Patient to discharge at an ambulatory level Supervision.   Patient's care partner is independent to provide the necessary physical assistance at discharge.  Reasons goals  not met: n/a  Recommendation:  Patient will benefit from ongoing skilled PT services in home health setting to continue to advance safe functional mobility, address ongoing impairments in BLE strength/ROM, endurance, pain, and independence w/ functional mobility, and minimize fall risk.  Equipment: w/c and RW  Reasons for discharge: treatment goals met and discharge from hospital  Patient/family agrees with progress made and goals achieved: Yes  PT Discharge Precautions/Restrictions Precautions Precautions: Fall Restrictions Weight Bearing Restrictions: Yes RLE Weight Bearing: Weight bearing as tolerated LLE Weight Bearing: Weight bearing as tolerated Pain Pain Assessment Pain Assessment: 0-10 Pain Score: 7  Pain Type: Surgical pain Pain Location: Knee Pain Orientation: Right;Left Pain Descriptors / Indicators: Aching Pain Onset: With Activity Patients Stated Pain Goal: 4 Pain Intervention(s): Medication (See eMAR) Vision/Perception  Perception Perception: Within Functional Limits Praxis Praxis: Intact  Cognition Overall Cognitive Status: Within Functional Limits for tasks assessed Arousal/Alertness: Awake/alert Orientation Level: Oriented X4 Attention: Selective Sustained Attention: Appears intact Selective Attention: Appears intact Memory: Appears intact Awareness: Appears intact Problem Solving: Appears intact Safety/Judgment: Appears intact Sensation Sensation Light Touch: Appears Intact Coordination Gross Motor Movements are Fluid and Coordinated: Yes Fine Motor Movements are Fluid and Coordinated: Yes Motor  Motor Motor: Within Functional Limits Motor - Skilled Clinical Observations: generalized weakness, limited by pain and ROM  Mobility Bed Mobility Bed Mobility: Rolling Right;Rolling Left;Sit to Supine;Supine to Sit Rolling Right: 6: Modified independent (Device/Increase time) Rolling Left: 6: Modified independent (Device/Increase time) Supine to  Sit: 6: Modified independent (Device/Increase time) Sit to Supine: 6: Modified independent (Device/Increase time) Transfers Transfers: Yes Sit to Stand: 6: Modified independent (Device/Increase time) Stand to Sit: 6: Modified independent (Device/Increase  time) Stand Pivot Transfers: 6: Modified independent (Device/Increase time) Locomotion  Ambulation Ambulation: Yes Ambulation/Gait Assistance: 5: Supervision Ambulation Distance (Feet): 150 Feet Assistive device: Rolling walker Ambulation/Gait Assistance Details: Verbal cues for safe use of DME/AE;Verbal cues for precautions/safety Gait Gait: Yes Gait Pattern: Impaired Gait Pattern: Step-to pattern;Left circumduction;Right circumduction;Trunk flexed;Poor foot clearance - left;Poor foot clearance - right;Wide base of support;Left flexed knee in stance;Right flexed knee in stance Gait velocity: decreased Stairs / Additional Locomotion Stairs: Yes Stairs Assistance: 4: Min guard Stair Management Technique: One rail Right Number of Stairs: 12 Height of Stairs: 6 Wheelchair Mobility Wheelchair Mobility: Yes Wheelchair Assistance: 5: Careers information officer: Both upper extremities Wheelchair Parts Management: Needs assistance Distance: 150'  Trunk/Postural Assessment  Cervical Assessment Cervical Assessment: Within Functional Limits Thoracic Assessment Thoracic Assessment: Within Functional Limits Lumbar Assessment Lumbar Assessment: Within Functional Limits Postural Control Postural Control: Within Functional Limits  Balance Balance Balance Assessed: Yes Static Sitting Balance Static Sitting - Balance Support: Feet supported;No upper extremity supported Static Sitting - Level of Assistance: 6: Modified independent (Device/Increase time) Dynamic Sitting Balance Dynamic Sitting - Balance Support: During functional activity;Feet unsupported;No upper extremity supported Dynamic Sitting - Level of Assistance: 6:  Modified independent (Device/Increase time) Static Standing Balance Static Standing - Balance Support: Bilateral upper extremity supported;During functional activity Static Standing - Level of Assistance: 6: Modified independent (Device/Increase time) Dynamic Standing Balance Dynamic Standing - Balance Support: During functional activity;No upper extremity supported Dynamic Standing - Level of Assistance: 6: Modified independent (Device/Increase time) Extremity Assessment  RLE Assessment RLE Assessment: Exceptions to WFL(strength testing limited by pain, grossly 3+ to 4-/5 throughout, knee ROM 5-80 deg) LLE Assessment LLE Assessment: Exceptions to WFL(strength testing limited by pain, grossly 3+ to 4-/5 throughout, knee ROM 3-74 deg)   See Function Navigator for Current Functional Status.  Tametria Aho K Arnette 06/12/2017, 12:08 PM

## 2017-06-12 NOTE — Progress Notes (Signed)
Social Work  Discharge Note  The overall goal for the admission was met for: DC SAT 06/13/2017  Discharge location: Yes-HOME WITH HUSBAND WHO WILL BE THERE OR HAS ARRANGED FOR AIDE TO BE WHEN HE IS NOT  Length of Stay: Yes-11 DAYS  Discharge activity level: Yes-SUPERVISION-MOD/I LEVEL  Home/community participation: Yes  Services provided included: MD, RD, PT, OT, RN, CM, Pharmacy and SW  Financial Services: Private Insurance: [facility]  Follow-up services arranged: Home Health: [facility], DME: [facility], 3 IN 1 and Patient/Family request agency HH: PREF PER MD, DME: NO PREF  Comments (or additional information):PT MET GOALS AND WILL HAVE SOMEONE WITH 24 HR EITHER HUSBAND OR HIRED AIDE. CONCERN IS ROM AND KNEE FLEXION NEEDS TO PUSH HERSELF. FEELS QUESTIONS AND CONCERNS HAVE BEEN ADDRESSED, READY FOR DC SAT  Patient/Family verbalized understanding of follow-up arrangements: Yes  Individual responsible for coordination of the follow-up plan: SELF & RICHARD-HUSBAND  Confirmed correct DME delivered: Elease Hashimoto 06/12/2017    Elease Hashimoto

## 2017-06-13 NOTE — Progress Notes (Signed)
Sherry [name] PHYSICAL MEDICINE & REHABILITATION     PROGRESS NOTE  Subjective/Complaints:  No new issues overnite , pt feels ok  ROS: Denies CP, SOB, N/V/D.  Objective: Vital Signs: Blood pressure (!) 138/57, pulse 97, temperature 98.3 F (36.8 C), temperature source Oral, resp. rate 18, height 5\' 5"  (1.651 m), weight 70 kg (154 lb 5.2 oz), last menstrual period 04/14/1997, SpO2 95 %. No results found. Recent Labs    06/11/17 0857  WBC 6.3  HGB 9.4*  HCT 29.9*  PLT 692*   Recent Labs    06/12/17 0604  NA 136  K 4.0  CL 102  GLUCOSE 100*  BUN 13  CREATININE 0.65  CALCIUM 8.8*   CBG (last 3)  No results for input(s): GLUCAP in the last 72 hours.  Wt Readings from Last 3 Encounters:  06/13/17 70 kg (154 lb 5.2 oz)  05/27/17 67.1 kg (148 lb)  05/22/17 67.1 kg (148 lb)    Physical Exam:  BP (!) 138/57 (BP Location: Right Arm)   Pulse 97   Temp 98.3 F (36.8 C) (Oral)   Resp 18   Ht 5\' 5"  (1.651 m)   Wt 70 kg (154 lb 5.2 oz)   LMP 04/14/1997   SpO2 95%   BMI 25.68 kg/m  Constitutional: Sherry [name] appears well-developed. NAD. HENT: Normocephalic. Atraumatic.  Eyes: EOM are normal. No discharge.  Cardiovascular: RRR. No JVD. Respiratory: Effort normal and breath sounds normal. GI: Bowel sounds are normal. Sherry [name] exhibits no distension.  Musc: B/l knee tenderness with edema and tenderness Neurological.  Follows full commands.  Motor: B/l upper extremities 5/5 proximal to distal.  B/l lower extremities: hip flexors, knee extension 4 -/5, ankle dorsi/plantar flexion 4+/5 (slowly improving) Skin. Warm and dry bilateral knee incisions with some erythema along left knee incision. No drainage noted.  Psych: Anxious  Assessment/Plan: 1. 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  Stable for D/C today F/u PCP in 3-4 weeks F/u PM&R 2 weeks See D/C summary See D/C instructions  Function:  Bathing Bathing position    Position: Shower  Bathing parts Body parts bathed by patient: Right arm, Left arm, Abdomen, Chest, Right upper leg, Left upper leg, Right lower leg, Left lower leg, Front perineal area, Buttocks, Back Body parts bathed by helper: Back, Left lower leg, Right lower leg  Bathing assist Assist Level: Supervision or verbal cues      Upper Body Dressing/Undressing Upper body dressing   What is the patient wearing?: Pull over shirt/dress, Bra Bra - Perfomed by patient: Thread/unthread right bra strap, Thread/unthread left bra strap, Hook/unhook bra (pull down sports bra)   Pull over shirt/dress - Perfomed by patient: Thread/unthread right sleeve, Thread/unthread left sleeve, Put head through opening, Pull shirt over trunk   Button up shirt - Perfomed by patient: Thread/unthread right sleeve, Thread/unthread left sleeve, Pull shirt around back, Button/unbutton shirt      Upper body assist Assist Level: Set up   Set up : To obtain clothing/put away  Lower Body Dressing/Undressing Lower body dressing   What is the patient wearing?: Pants, Non-skid slipper socks, Ted Hose, Shoes     Pants- Performed by patient: Thread/unthread right pants leg, Thread/unthread left pants leg, Pull pants up/down Pants- Performed by helper: Thread/unthread left pants leg, Pull pants up/down Non-skid slipper socks- Performed by patient: Don/doff right sock, Don/doff left sock Non-skid slipper socks- Performed by helper: Don/doff right sock, Don/doff left sock  Shoes - Performed by patient: Don/doff right shoe, Don/doff left shoe, Fasten right, Fasten left       TED Hose - Performed by patient: Don/doff right TED hose, Don/doff left TED hose TED Hose - Performed by helper: Don/doff right TED hose, Don/doff left TED hose  Lower body assist Assist for lower body dressing: Supervision or verbal cues      Toileting Toileting   Toileting steps completed by patient: Adjust clothing prior to toileting,  Performs perineal hygiene, Adjust clothing after toileting Toileting steps completed by helper: Adjust clothing prior to toileting, Performs perineal hygiene, Adjust clothing after toileting Toileting Assistive Devices: Grab bar or rail  Toileting assist Assist level: More than reasonable time   Transfers Chair/bed transfer   Chair/bed transfer method: Ambulatory Chair/bed transfer assist level: No Help, no cues, assistive device, takes more than a reasonable amount of time Chair/bed transfer assistive device: Armrests, Medical sales representative     Max distance: 150' Assist level: Supervision or verbal cues   Wheelchair   Type: Manual Max wheelchair distance: 150' Assist Level: Supervision or verbal cues  Cognition Comprehension Comprehension assist level: Follows complex conversation/direction with no assist  Expression Expression assist level: Expresses complex ideas: With no assist  Social Interaction Social Interaction assist level: Interacts appropriately with others - No medications needed.  Problem Solving Problem solving assist level: Solves complex problems: Recognizes & self-corrects  Memory Memory assist level: Complete Independence: No helper    Medical Problem List and Plan:  1. Decreased functional mobility secondary to end-stage osteoarthritis bilateral knees. Status post bilateral TKA 05/27/2017. Weightbearing as tolerated.    , plan DC today     2. DVT Prophylaxis/Anticoagulation: Coumadin for DVT prophylaxis. Plan is for Coumadin 4 weeks then aspirin 81 mg daily    Vascular study negative for DVT   INR therapeutic on 3/1 3. Pain Management: Neurontin 300 mg 3 times a day, Valium and oxycodone as needed. Monitor narcotics for increased sedation    Scheduled ice to knees while awake, encouraged use again   Patient sensitive to oxycodone, added when necessary low dose tramadol on 2/22, increased on 2/25   Poor coping mechanisms, cont to educate 4.  Mood: Provide emotional support, with reactive depression   Team to provide encouragement, requires emotional support 5. Neuropsych: This patient is capable of making decisions on her own behalf.  6. Skin/Wound Care: Routine skin checks  7. Fluids/Electrolytes/Nutrition: Routine I&O's    BMP within acceptable range on 3/1 8. Acute blood loss anemia.    Hb 9.4 on 2/28   Cont to monitor 9. Chronic constipation. Continue Movantik, qod miralax, dulcolax suppository tonight    Hx of IBS  10.GERD.Prilosec              prn maalox 11. Tachycardia    Monitor in accordance with pain and increasing activity   Improving overall 12. Hypoalbuminemia   Supplement initiated on 2/20 13. Sleep disturbance   Trazodone as needed started on 2/25   Improving  LOS (Days) 11 A FACE TO FACE EVALUATION WAS PERFORMED  Sherry [name] 06/13/2017 8:03 AM

## 2017-06-13 NOTE — Progress Notes (Signed)
Discharged to home accompanied by husband. Discharge instructions given yesterday and walker at bedside. Denied questions about information provided. Assisted down to car via w/c by NT. Margarito Liner

## 2017-06-15 NOTE — Discharge Summary (Signed)
Sherry [name], Sherry [name]                ACCOUNT NO.:  [id number]  MEDICAL RECORD NO.:  [id number]  LOCATION:                                 FACILITY:  PHYSICIAN:  Delice Lesch, MD             DATE OF BIRTH:  DATE OF ADMISSION:  06/02/2017 DATE OF DISCHARGE:  06/13/2017                              DISCHARGE SUMMARY   DISCHARGE DIAGNOSES: 1. Bilateral end-stage osteoarthritis of the knees, status post     bilateral total knee replacement May 27, 2017. 2. Coumadin for DVT prophylaxis. 3. Pain management. 4. Acute blood loss anemia. 5. Chronic constipation. 6. Gastroesophageal reflux disease.  HISTORY OF PRESENT ILLNESS:  This is a 72 year old right-handed female, history of chronic back and neck pain, osteoarthritis in bilateral knees and no relief with conservative care.  She lives with her husband, independent prior to admission.  Presented on May 27, 2017, with progressive knee pain.  Underwent bilateral total knee arthroplasty after no change with conservative care on May 27, 2017, per Dr. Wynelle Link.  Hospital course, pain management.  Weightbearing as tolerated. Placed on Coumadin for DVT prophylaxis.  Acute blood loss anemia, 8.5 and monitored.  The patient was admitted for a comprehensive rehab program.  PAST MEDICAL HISTORY:  See discharge diagnoses.  SOCIAL HISTORY:  Lives with spouse, independent prior to admission.  FUNCTIONAL STATUS:  Upon admission to [facility] was moderate assist, 4 feet rolling walker, moderate assist sit to stand, minimum to max assist activities of daily living.  PHYSICAL EXAMINATION:  VITAL SIGNS:  Blood pressure 120/59, pulse 83, temperature 98, and respirations 13. GENERAL:  Alert female, somewhat sedated at times with narcotics. EYES:  EOMs intact. NECK:  Supple, nontender.  No JVD. CARDIAC:  Rate controlled. ABDOMEN:  Soft, nontender.  Good bowel sounds. LUNGS:  Clear to auscultation without wheeze. EXTREMITIES:   Bilateral knee incisions clean and dry.  No drainage.  REHABILITATION HOSPITAL COURSE:  The patient was admitted to Inpatient Rehab Services.  Therapies initiated on a 3-hour daily basis, consisting of physical therapy, occupational therapy, and rehabilitation nursing. The following issues were addressed during the patient's rehabilitation stay.  Pertaining to Mrs. Rineer' bilateral total knee replacement remained stable.  Neurovascular sensation intact.  Weightbearing as tolerated.  She would follow up with Dr. Pilar Plate Aluisio.  She remained on Coumadin for DVT prophylaxis.  Venous Doppler studies negative.  She would complete Coumadin therapy after 1 month, June 24, 2017, and then begin aspirin 81 mg daily.  Pain management with the use of Neurontin 300 mg t.i.d., Valium and oxycodone as needed.  Close monitoring of sedation.  Acute blood loss anemia stable at 8.1.  Blood pressures controlled.  Bouts of constipation resolved with laxative assistance. She was on Movantik.  Followup hemoglobin 9.4.  The patient received weekly collaborative interdisciplinary team conferences to discuss estimated length of stay, family teaching, any barriers to discharge. She was ambulating up to excess of 500 feet, rolling walker supervision, transferred wheelchair, stand pivot rolling walker supervision to propel her wheelchair supervision.  Supervision for sit to stand to toilet. Working with energy conservation techniques.  Gathering belongings for  activities of daily living and homemaking.  She completed all bathing in a sitting with supervision.  Full family teaching completed and discharged to home.  DISCHARGE MEDICATIONS: 1. Colace 100 mg p.o. b.i.d. 2. Neurontin 300 mg p.o. t.i.d. 3. Movantik 25 mg daily. 4. MiraLAX every other day. 5. Desyrel 50 mg p.o. at bedtime. 6. Coumadin latest dose of 5 mg adjusted accordingly for INR of 2.00     to 3.00. 7. Valium 10 mg every 6 hours as needed  muscle spasms. 8. Oxycodone 5 mg every 8 hours as needed for severe pain. 9. Ultram 50 mg every 4 hours as needed for moderate pain.  DIET:  Regular.  She was weightbearing as tolerated.  FOLLOWUP:  She would follow up with Dr. Delice Lesch at the Outpatient Rehab Service office as directed; Dr. Gaynelle Arabian, call for appointment; Dr. Josetta Huddle, medical management.  A home health nurse had been arranged to check INR on June 16, 2017.  Results to Lauraine Rinne, physician assistant, [phone].  The patient will continue Coumadin until June 24, 2017, and then begin aspirin 81 mg p.o. daily.     Lauraine Rinne, P.A.   ______________________________ Delice Lesch, MD    DA/MEDQ  D:  06/12/2017  T:  06/12/2017  Job:  [id number]  cc:   Gaynelle Arabian, M.D. Delice Lesch, MD Henrine Screws, M.D.

## 2017-07-03 ENCOUNTER — Other Ambulatory Visit: Payer: Self-pay | Admitting: Internal Medicine

## 2017-07-03 ENCOUNTER — Ambulatory Visit
Admission: RE | Admit: 2017-07-03 | Discharge: 2017-07-03 | Disposition: A | Discharge status: Home / Self Care | Payer: Medicare Other | Source: Ambulatory Visit | Attending: Internal Medicine | Admitting: Internal Medicine

## 2017-07-03 DIAGNOSIS — R109 Unspecified abdominal pain: Secondary | ICD-10-CM

## 2017-07-17 ENCOUNTER — Inpatient Hospital Stay: Payer: Medicare Other | Admitting: Physical Medicine & Rehabilitation

## 2017-07-22 ENCOUNTER — Encounter: Payer: Self-pay | Admitting: Physical Medicine & Rehabilitation

## 2017-07-22 ENCOUNTER — Encounter: Payer: Medicare Other | Attending: Physical Medicine & Rehabilitation | Admitting: Physical Medicine & Rehabilitation

## 2017-07-22 VITALS — BP 124/82 | HR 71 | Ht 65.0 in | Wt 146.0 lb

## 2017-07-22 DIAGNOSIS — F411 Generalized anxiety disorder: Secondary | ICD-10-CM

## 2017-07-22 DIAGNOSIS — K449 Diaphragmatic hernia without obstruction or gangrene: Secondary | ICD-10-CM | POA: Insufficient documentation

## 2017-07-22 DIAGNOSIS — Z5181 Encounter for therapeutic drug level monitoring: Secondary | ICD-10-CM

## 2017-07-22 DIAGNOSIS — G479 Sleep disorder, unspecified: Secondary | ICD-10-CM

## 2017-07-22 DIAGNOSIS — M170 Bilateral primary osteoarthritis of knee: Secondary | ICD-10-CM | POA: Insufficient documentation

## 2017-07-22 DIAGNOSIS — F329 Major depressive disorder, single episode, unspecified: Secondary | ICD-10-CM

## 2017-07-22 DIAGNOSIS — Z96653 Presence of artificial knee joint, bilateral: Secondary | ICD-10-CM | POA: Diagnosis present

## 2017-07-22 DIAGNOSIS — Z87891 Personal history of nicotine dependence: Secondary | ICD-10-CM | POA: Insufficient documentation

## 2017-07-22 DIAGNOSIS — G8918 Other acute postprocedural pain: Secondary | ICD-10-CM | POA: Diagnosis not present

## 2017-07-22 DIAGNOSIS — K589 Irritable bowel syndrome without diarrhea: Secondary | ICD-10-CM | POA: Insufficient documentation

## 2017-07-22 DIAGNOSIS — G8929 Other chronic pain: Secondary | ICD-10-CM | POA: Diagnosis not present

## 2017-07-22 DIAGNOSIS — K219 Gastro-esophageal reflux disease without esophagitis: Secondary | ICD-10-CM | POA: Insufficient documentation

## 2017-07-22 DIAGNOSIS — F419 Anxiety disorder, unspecified: Secondary | ICD-10-CM | POA: Diagnosis not present

## 2017-07-22 DIAGNOSIS — K5901 Slow transit constipation: Secondary | ICD-10-CM

## 2017-07-22 DIAGNOSIS — M542 Cervicalgia: Secondary | ICD-10-CM | POA: Insufficient documentation

## 2017-07-22 DIAGNOSIS — M545 Low back pain: Secondary | ICD-10-CM | POA: Insufficient documentation

## 2017-07-22 DIAGNOSIS — Z79899 Other long term (current) drug therapy: Secondary | ICD-10-CM | POA: Diagnosis not present

## 2017-07-22 DIAGNOSIS — G5603 Carpal tunnel syndrome, bilateral upper limbs: Secondary | ICD-10-CM | POA: Diagnosis not present

## 2017-07-22 NOTE — Addendum Note (Signed)
Addended by: Caro Hight on: 07/22/2017 12:52 PM   Modules accepted: Orders

## 2017-07-22 NOTE — Progress Notes (Signed)
Subjective:    Patient ID: Sherry [name], female    DOB: Sep 04, 1945, 72 y.o.   MRN: [id number]  HPI  72 year old right-handed female, history of chronic back and neck pain, osteoarthritis in bilateral knees presents for hospital followup after receiving CIR for b/l TKA.  DATE OF ADMISSION:  06/02/2017 DATE OF DISCHARGE:  06/13/2017  At discharge, she was instructed to folluw up with Ortho, which she did.  She saw her PCP. She completed coumadin, but only took ASA for a week. She states her pain was horrible until a week ago, but is improving. Bowel movements are improving.  Sleep is poor due to difficulty finding comfortable positions.  Therapies: 3/week DME: Purchased Mobility: [place] in community  Pain Inventory Average Pain 4 Pain Right Now 4 My pain is intermittent, sharp, dull and aching  In the last 24 hours, has pain interfered with the following? General activity 5 Relation with others 5 Enjoyment of life 5 What TIME of day is your pain at its worst? night Sleep (in general) Poor  Pain is worse with: sitting and standing Pain improves with: rest, therapy/exercise, pacing activities and medication Relief from Meds: 4  Mobility walk without assistance ability to climb steps?  yes do you drive?  yes  Function not employed: date last employed .  Neuro/Psych No problems in this area  Prior Studies Any changes since last visit?  no  Physicians involved in your care Any changes since last visit?  no   Family History  Problem Relation Age of Onset  . Kidney disease Father   . Rheum arthritis Father   . Parkinson's disease Mother   . Leukemia Sister   . Colon cancer Maternal Grandfather        questionable  . Breast cancer Paternal Aunt   . Anesthesia problems Neg Hx   . Hypotension Neg Hx   . Malignant hyperthermia Neg Hx   . Pseudochol deficiency Neg Hx    Social History   Socioeconomic History  . Marital status: Married    Spouse name: Not on  file  . Number of children: Not on file  . Years of education: Not on file  . Highest education level: Not on file  Occupational History  . Not on file  Social Needs  . Financial resource strain: Not on file  . Food insecurity:    Worry: Not on file    Inability: Not on file  . Transportation needs:    Medical: Not on file    Non-medical: Not on file  Tobacco Use  . Smoking status: Former Smoker    Years: 4.00    Types: Cigarettes    Last attempt to quit: 04/14/1968    Years since quitting: 49.3  . Smokeless tobacco: Never Used  . Tobacco comment: "just a social smoker for ~ 4 yrs"  Substance and Sexual Activity  . Alcohol use: Yes    Alcohol/week: 3.6 oz    Types: 6 Glasses of wine per week  . Drug use: No  . Sexual activity: Yes    Partners: Male    Birth control/protection: Post-menopausal  Lifestyle  . Physical activity:    Days per week: Not on file    Minutes per session: Not on file  . Stress: Not on file  Relationships  . Social connections:    Talks on phone: Not on file    Gets together: Not on file    Attends religious service: Not on file  Active member of club or organization: Not on file    Attends meetings of clubs or organizations: Not on file    Relationship status: Not on file  Other Topics Concern  . Not on file  Social History Narrative  . Not on file   Past Surgical History:  Procedure Laterality Date  . APPENDECTOMY  1967  . BLADDER SUSPENSION  1980's   sling  . BREAST BIOPSY Left 1980's   "twice on the left"  . BREAST REDUCTION SURGERY Bilateral 1988  . CERVIX LESION DESTRUCTION  age 69s  . DEBRIDEMENT COMPLEX MUCOID CYST, ARTHROTOMY W/ SYNOVECTOMY Left 07-21-2008  dr sypher [facility]   Left long finger  . DILATION AND CURETTAGE OF UTERUS  1988  . INCISION AND DRAINAGE ABSCESS / HEMATOMA OF BURSA / KNEE / [place]   "day after knee scope"  . KNEE ARTHROSCOPY  1994   unilateral  . POSTERIOR FUSION LUMBAR SPINE  02/2011   L3-L5  .  TONSILLECTOMY  1951  . TOTAL KNEE ARTHROPLASTY Bilateral 05/27/2017   Procedure: TOTAL KNEE BILATERAL;  Surgeon: Gaynelle Arabian, MD;  Location: [facility];  Service: Orthopedics;  Laterality: Bilateral;   Past Medical History:  Diagnosis Date  . Anxiety   . Carpal tunnel syndrome on both sides   . Chronic back pain    herniated disc,stenosis,radiculopathy,spondylolisthesis  . Chronic neck pain   . Constipation, chronic   . Degenerative spinal arthritis    cervical and lumbar  . Depression   . Eczema   . GERD (gastroesophageal reflux disease)   . Hemorrhoids   . Hiatal hernia   . History of bronchitis    "I've had it a number of times"  . IBS (irritable bowel syndrome)    lactose intolerant  . Osteoarthritis    bilateral knees and shoulders,  little fingers  . TMJ (dislocation of temporomandibular joint)   . Wears glasses    BP 124/82   Pulse 71   Ht 5\' 5"  (1.651 m) Comment: states  Wt 146 lb (66.2 kg)   LMP 04/14/1997   SpO2 97%   BMI 24.30 kg/m   Opioid Risk Score:   Fall Risk Score:  `1  Depression screen PHQ 2/9  No flowsheet data found.   Review of Systems  Constitutional: Negative.   HENT: Negative.   Eyes: Negative.   Respiratory: Negative.   Cardiovascular: Negative.   Gastrointestinal: Positive for constipation.  Endocrine: Negative.   Genitourinary: Negative.   Musculoskeletal: Positive for arthralgias, gait problem, joint swelling and myalgias.  Skin: Negative.   Allergic/Immunologic: Negative.   Hematological: Negative.   Psychiatric/Behavioral: Negative.   All other systems reviewed and are negative.     Objective:   Physical Exam Constitutional: She appears well-developed. NAD. HENT: Normocephalic. Atraumatic.  Eyes: EOM are normal. No discharge.  Cardiovascular: RRR. No JVD. Respiratory: Effort normal and breath sounds normal. GI: Bowel sounds are normal. She exhibits no distension.  Musc: B/l knee tenderness with edema and  tenderness Neurological.  Follows full commands.  Motor: B/l upper extremities 5/5 proximal to distal.  B/l lower extremities: hip flexors, knee extension 4+/5, ankle dorsi/plantar flexion 5/5  Skin. Warm and dry.  Psych: Anxious    Assessment & Plan:  72 year old right-handed female, history of chronic back and neck pain, osteoarthritis in bilateral knees presents for hospital followup after receiving CIR for b/l TKA.  1. Decreased functional mobility secondary to end-stage osteoarthritis bilateral knees. Status post bilateral TKA 05/27/2017.  Cont therapies  Follow up with Ortho  2. DVT Prophylaxis   Instructed to resume ASA  3. Pain Management  Wean Tramadol  Encouraged use of ICE/TENS/Tylenol   4. Chronic constipation/IBS  Cont Miralax  5. Sleep disturbance  Cont Trazodone to 50mg   Encouraged OTC Melatonin  6. Anxiety/Reactive depression  D/c Diazepam  Overall controlled per pt

## 2017-07-22 NOTE — Progress Notes (Signed)
CSA is not needed and UDS cancelled per Dr Serita Grit request.

## 2017-07-22 NOTE — Progress Notes (Addendum)
OPIOID PROTOCOL CHECKLIST  BLACK DOT OBSERVED:                                  YES OPIOID RISK ASSESMENT COMPLETE:         YES CSA PREPARED:                                                YES UDS ORDERED:                                                 YES CSA SIGNED:                                                      YES  CSA is not needed and UDS cancelled per Dr Serita Grit request.

## 2017-09-10 ENCOUNTER — Encounter: Payer: Self-pay | Admitting: Podiatry

## 2017-09-10 ENCOUNTER — Ambulatory Visit: Payer: Medicare Other | Admitting: Podiatry

## 2017-09-10 DIAGNOSIS — B351 Tinea unguium: Secondary | ICD-10-CM | POA: Diagnosis not present

## 2017-09-10 MED ORDER — TERBINAFINE HCL 250 MG PO TABS: ORAL_TABLET | ORAL | 0 refills | Status: DC

## 2017-09-11 ENCOUNTER — Ambulatory Visit: Payer: Medicare Other

## 2017-09-11 DIAGNOSIS — B351 Tinea unguium: Secondary | ICD-10-CM

## 2017-09-11 NOTE — Progress Notes (Signed)
Subjective:   Patient ID: Sherry [name], female   DOB: 72 y.o.   MRN: [id number]   HPI Patient presents stating I have a lot of problems with my left hallux nail being yellow and discolored and I may have head trauma but I did have a fungal culture which was negative but they were not able to get a good sample.  I would like it treated as best as possible   ROS      Objective:  Physical Exam  Neurovascular status intact with patient found to have discoloration of the distal two thirds of the nailbed left hallux with the fissure line indicating probable trauma.  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  I do think there is probably some kind of localized fungal or yeast infection and I do think that laser therapy may be of benefit for her and she is scheduled for this and also I went ahead and I did place her on a Lamisil pulse pack therapy.  I gave instructions that this may take 6 months but I am hopeful that this will make a difference and she is scheduled for laser

## 2017-09-15 NOTE — Progress Notes (Signed)
Pt presents with mycotic infection of nails bilateral hallux, right 4th  All other systems are negative  Laser therapy administered to affected nails and tolerated well. All safety precautions were in place. Re-appointed in 4 weeks for 2nd treatment

## 2017-09-16 ENCOUNTER — Ambulatory Visit: Payer: Medicare Other | Admitting: Physical Medicine & Rehabilitation

## 2017-10-23 ENCOUNTER — Other Ambulatory Visit: Payer: Medicare Other

## 2017-11-24 ENCOUNTER — Other Ambulatory Visit: Payer: Self-pay | Admitting: Obstetrics and Gynecology

## 2017-11-24 DIAGNOSIS — Z1231 Encounter for screening mammogram for malignant neoplasm of breast: Secondary | ICD-10-CM

## 2017-11-27 ENCOUNTER — Other Ambulatory Visit: Payer: Medicare Other

## 2017-12-21 ENCOUNTER — Ambulatory Visit: Payer: Medicare Other

## 2017-12-21 DIAGNOSIS — B351 Tinea unguium: Secondary | ICD-10-CM

## 2017-12-23 NOTE — Progress Notes (Signed)
Pt presents with mycotic infection of nails bilateral hallux, right 4th  All other systems are negative  Laser therapy administered to affected nails and tolerated well. All safety precautions were in place. Re-appointed in 4 weeks for 2nd treatment.  No charge for ablation today due to disruption treatments

## 2018-01-01 ENCOUNTER — Ambulatory Visit: Payer: Medicare Other

## 2018-01-01 ENCOUNTER — Ambulatory Visit
Admission: RE | Admit: 2018-01-01 | Discharge: 2018-01-01 | Disposition: A | Discharge status: Home / Self Care | Payer: Medicare Other | Source: Ambulatory Visit | Attending: Obstetrics and Gynecology | Admitting: Obstetrics and Gynecology

## 2018-01-01 DIAGNOSIS — Z1231 Encounter for screening mammogram for malignant neoplasm of breast: Secondary | ICD-10-CM

## 2018-04-14 HISTORY — PX: CARPAL TUNNEL RELEASE: SHX101

## 2018-05-07 ENCOUNTER — Other Ambulatory Visit: Payer: Self-pay | Admitting: Internal Medicine

## 2018-05-07 DIAGNOSIS — M810 Age-related osteoporosis without current pathological fracture: Secondary | ICD-10-CM

## 2018-05-07 DIAGNOSIS — Z13820 Encounter for screening for osteoporosis: Secondary | ICD-10-CM

## 2018-05-25 ENCOUNTER — Ambulatory Visit (INDEPENDENT_AMBULATORY_CARE_PROVIDER_SITE_OTHER): Payer: Medicare Other | Admitting: Obstetrics and Gynecology

## 2018-05-25 ENCOUNTER — Other Ambulatory Visit (HOSPITAL_COMMUNITY)
Admission: RE | Admit: 2018-05-25 | Discharge: 2018-05-25 | Disposition: A | Discharge status: Home / Self Care | Payer: Medicare Other | Source: Ambulatory Visit | Attending: Obstetrics and Gynecology | Admitting: Obstetrics and Gynecology

## 2018-05-25 ENCOUNTER — Other Ambulatory Visit: Payer: Self-pay

## 2018-05-25 ENCOUNTER — Encounter: Payer: Self-pay | Admitting: Obstetrics and Gynecology

## 2018-05-25 VITALS — BP 120/70 | HR 70 | Resp 16 | Ht 64.75 in | Wt 154.0 lb

## 2018-05-25 DIAGNOSIS — N816 Rectocele: Secondary | ICD-10-CM

## 2018-05-25 DIAGNOSIS — Z01419 Encounter for gynecological examination (general) (routine) without abnormal findings: Secondary | ICD-10-CM

## 2018-05-25 NOTE — Progress Notes (Signed)
73 y.o. G29P0013 Married Caucasian female here for annual exam.    Feeling a little short of breath in the last week.  Feeling congested.  Denies fever.  Just finished a round of Tamiflu.  She was exposed to her grandchildren with the flu.   Having some discomfort with intercourse.   She leaks urine when she gets up at night. May leak with a hard cough, not with exercise.  Is having fecal incontinence.  Has IBS.  Using Miralax daily.  Also had blood in her stool a couple of weeks ago.  She has internal and external hemorrhoids.  Not sure if she was straining. Can bleed if she strains.  Sees Dr. Oletta Lamas.  Not using hemorrhoid cream.   Had bilateral knee replacements one year ago.  Going skiing in a month.   PCP:    Josetta Huddle, MD  Patient's last menstrual period was 04/14/1997.           Sexually active: Yes.    The current method of family planning is post menopausal status.    Exercising: Yes.    walking and exercise bike\ Smoker:  Former  Health Maintenance: Pap:  03/14/16 Negative History of abnormal Pap:  Yes, hx of conization of cervix in her 15s, paps normal since MMG:  01/04/18 BIRADS 1 negative/density d Colonoscopy:  11/2015 normal with Dr.James Edwards;next due 2027 BMD:   2014  Result  Norma with PCP -- scheduled 07/06/18 TDaP:  2016 Gardasil:   n/a HIV: no Hep C: no Screening Labs: PCP   reports that she quit smoking about 50 years ago. Her smoking use included cigarettes. She quit after 4.00 years of use. She has never used smokeless tobacco. She reports current alcohol use of about 5.0 - 6.0 standard drinks of alcohol per week. She reports that she does not use drugs.  Past Medical History:  Diagnosis Date  . Anxiety   . Carpal tunnel syndrome on both sides   . Chronic back pain    herniated disc,stenosis,radiculopathy,spondylolisthesis  . Chronic neck pain   . Constipation, chronic   . Degenerative spinal arthritis    cervical and lumbar  .  Depression   . Eczema   . GERD (gastroesophageal reflux disease)   . Hemorrhoids   . Hiatal hernia   . History of bronchitis    "I've had it a number of times"  . IBS (irritable bowel syndrome)    lactose intolerant  . Osteoarthritis    bilateral knees and shoulders,  little fingers  . TMJ (dislocation of temporomandibular joint)   . Wears glasses     Past Surgical History:  Procedure Laterality Date  . APPENDECTOMY  1967  . BLADDER SUSPENSION  1980's   sling  . BREAST BIOPSY Left 1980's   "twice on the left"  . BREAST REDUCTION SURGERY Bilateral 1988  . CERVIX LESION DESTRUCTION  age 27s  . DEBRIDEMENT COMPLEX MUCOID CYST, ARTHROTOMY W/ SYNOVECTOMY Left 07-21-2008  dr sypher [facility]   Left long finger  . DILATION AND CURETTAGE OF UTERUS  1988  . INCISION AND DRAINAGE ABSCESS / HEMATOMA OF BURSA / KNEE / [place]   "day after knee scope"  . KNEE ARTHROSCOPY  1994   unilateral  . POSTERIOR FUSION LUMBAR SPINE  02/2011   L3-L5  . REDUCTION MAMMAPLASTY Bilateral   . TONSILLECTOMY  1951  . TOTAL KNEE ARTHROPLASTY Bilateral 05/27/2017   Procedure: TOTAL KNEE BILATERAL;  Surgeon: Gaynelle Arabian, MD;  Location:  [facility];  Service: Orthopedics;  Laterality: Bilateral;    Current Outpatient Medications  Medication Sig Dispense Refill  . famotidine (PEPCID) 20 MG tablet Take 20 mg by mouth as needed for heartburn or indigestion.    . gabapentin (NEURONTIN) 300 MG capsule Neurontin 300 mg capsule  Take 1 capsule 3 times a day by oral route.    . polyethylene glycol (MIRALAX) packet Take 17 g by mouth 3 (three) times daily. Until bowels move; maximum 2 consecutive days (Patient taking differently: Take 17 g by mouth every other day. Until bowels move; maximum 2 consecutive days) 6 each 0   No current facility-administered medications for this visit.     Family History  Problem Relation Age of Onset  . Kidney disease Father   . Rheum arthritis Father   . Parkinson's disease  Mother   . Leukemia Sister   . Colon cancer Maternal Grandfather        questionable  . Breast cancer Paternal Aunt   . Anesthesia problems Neg Hx   . Hypotension Neg Hx   . Malignant hyperthermia Neg Hx   . Pseudochol deficiency Neg Hx     Review of Systems  Constitutional:       Weight gain  HENT: Negative.   Eyes: Negative.   Respiratory: Negative.   Cardiovascular: Negative.   Gastrointestinal: Positive for constipation and diarrhea.       Bloating Change in quality of stool Blood in stools  Endocrine: Negative.   Genitourinary:       Loss of urine with sneeze or cough Night urination  Musculoskeletal: Positive for myalgias.  Skin: Negative.   Allergic/Immunologic: Negative.   Neurological: Negative.   Hematological: Negative.   Psychiatric/Behavioral: Negative.     Exam:   BP 120/70   Pulse 70   Resp 16   Ht 5' 4.75" (1.645 m)   Wt 154 lb (69.9 kg)   LMP 04/14/1997   BMI 25.83 kg/m     General appearance: alert, cooperative and appears stated age Head: Normocephalic, without obvious abnormality, atraumatic Neck: no adenopathy, supple, symmetrical, trachea midline and thyroid normal to inspection and palpation Lungs: clear to auscultation bilaterally Breasts: scars consistent with reduction bilaterally,  no masses or tenderness, No nipple retraction or dimpling, No nipple discharge or bleeding, No axillary or supraclavicular adenopathy Heart: regular rate and rhythm Abdomen: soft, non-tender; no masses, no organomegaly Extremities: extremities normal, atraumatic, no cyanosis or edema Skin: Skin color, texture, turgor normal. No rashes or lesions Lymph nodes: Cervical, supraclavicular, and axillary nodes normal. No abnormal inguinal nodes palpated Neurologic: Grossly normal  Pelvic: External genitalia:  no lesions              Urethra:  normal appearing urethra with no masses, tenderness or lesions              Bartholins and Skenes: normal                  Vagina: normal appearing vagina with normal color and discharge, no lesions              Cervix: no lesions              Pap taken: Yes.   Bimanual Exam:  Uterus:  normal size, contour, position, consistency, mobility, non-tender              Adnexa: no mass, fullness, tenderness  Rectal exam: Yes.  .  Confirms.  Almost second degree rectocele appreciated.               Anus:  normal sphincter tone, no lesions  Chaperone was present for exam.  Assessment:   Well woman visit with normal exam. Breast reduction.  Remote hx of cervical conization.  Off HRT.  Blood in stools.  Hemorrhoids.  Urinary incontinence. Fecal incontinence.  Rectocele.  Plan: Mammogram screening. Recommended self breast awareness. Pap and HR HPV as above. Guidelines for Calcium, Vitamin D, regular exercise program including cardiovascular and weight bearing exercise. I discussed local vaginal estrogen and she declines.  Follow up annually and prn.  Discussed rectocele options for care observation, PT, pessary and surgery.  Metamucil discussed.  ACOG HO on prolapse to patient.  Labs with PCP.  She will see her PCP if her SOB persists.   After visit summary provided.

## 2018-05-26 LAB — CYTOLOGY - PAP: Diagnosis: NEGATIVE

## 2018-05-27 DIAGNOSIS — N816 Rectocele: Secondary | ICD-10-CM | POA: Insufficient documentation

## 2018-07-06 ENCOUNTER — Other Ambulatory Visit: Payer: Medicare Other

## 2018-08-09 ENCOUNTER — Other Ambulatory Visit: Payer: Medicare Other

## 2018-10-12 ENCOUNTER — Other Ambulatory Visit: Payer: Self-pay

## 2018-10-12 ENCOUNTER — Ambulatory Visit
Admission: RE | Admit: 2018-10-12 | Discharge: 2018-10-12 | Disposition: A | Discharge status: Home / Self Care | Payer: Medicare Other | Source: Ambulatory Visit | Attending: Internal Medicine | Admitting: Internal Medicine

## 2018-10-12 DIAGNOSIS — Z13820 Encounter for screening for osteoporosis: Secondary | ICD-10-CM

## 2018-11-22 ENCOUNTER — Other Ambulatory Visit: Payer: Self-pay | Admitting: Obstetrics and Gynecology

## 2018-11-22 DIAGNOSIS — Z1231 Encounter for screening mammogram for malignant neoplasm of breast: Secondary | ICD-10-CM

## 2019-01-03 ENCOUNTER — Ambulatory Visit
Admission: RE | Admit: 2019-01-03 | Discharge: 2019-01-03 | Disposition: A | Discharge status: Home / Self Care | Payer: Medicare Other | Source: Ambulatory Visit | Attending: Obstetrics and Gynecology | Admitting: Obstetrics and Gynecology

## 2019-01-03 ENCOUNTER — Other Ambulatory Visit: Payer: Self-pay

## 2019-01-03 DIAGNOSIS — Z1231 Encounter for screening mammogram for malignant neoplasm of breast: Secondary | ICD-10-CM

## 2019-02-17 ENCOUNTER — Inpatient Hospital Stay: Admit: 2019-02-17 | Payer: Medicare Other | Admitting: Orthopedic Surgery

## 2019-02-17 SURGERY — ARTHROPLASTY, SHOULDER, TOTAL
Anesthesia: General | Site: Shoulder | Laterality: Right

## 2019-03-30 ENCOUNTER — Other Ambulatory Visit: Payer: Self-pay

## 2019-03-30 ENCOUNTER — Ambulatory Visit: Payer: Medicare Other | Attending: Internal Medicine

## 2019-03-30 DIAGNOSIS — Z20822 Contact with and (suspected) exposure to covid-19: Secondary | ICD-10-CM

## 2019-04-01 LAB — NOVEL CORONAVIRUS, NAA: SARS-CoV-2, NAA: NOT DETECTED

## 2019-04-22 DIAGNOSIS — Z8616 Personal history of COVID-19: Secondary | ICD-10-CM

## 2019-04-22 HISTORY — DX: Personal history of COVID-19: Z86.16

## 2019-05-31 ENCOUNTER — Other Ambulatory Visit: Payer: Self-pay

## 2019-05-31 NOTE — Progress Notes (Addendum)
74 y.o. G69P0012 Married Caucasian female here for annual exam.    Good bladder control.  Has some constipation.  Stopped Dicyclomine and her constipation improved.  Using Miralax and too much at times. Has hemorrhoids and occasional bright red bleeding with straining.  No blood in her stool otherwise.  Denies vaginal bleeding.   Dealing with vaginal dryness.  She declines a prescription.   Got Covid after traveled to [place].  Group became ill.   Had bilateral carpal tunnel surgery. She will see her orthopedist today for some pain in her left wrist.   Sees her PCP for her blood work.   PCP:  Josetta Huddle, MD  Patient's last menstrual period was 04/14/1997.           Sexually active: Yes.    The current method of family planning is post menopausal status.    Exercising: Yes.    works with a Stage [employer], walks dogs and exercise bike Smoker:  Former  Health Maintenance: Pap: 05-25-18 Neg, 03/14/16 Neg, 12-08-13 Neg History of abnormal Pap:  Yes, hx of conization of cervix in her 20s, paps normal since MMG: 01-03-19 3D/Neg/density C/Birads1 Colonoscopy:  11/2015 normal;next due 2027 BMD: 10-12-18 Result :Normal TDaP: 01-17-15 Gardasil:   n/a HIV: no Hep C: no Screening Labs:  PCP.   reports that she quit smoking about 51 years ago. Her smoking use included cigarettes. She quit after 4.00 years of use. She has never used smokeless tobacco. She reports current alcohol use of about 5.0 - 6.0 standard drinks of alcohol per week. She reports that she does not use drugs.  Past Medical History:  Diagnosis Date  . Anxiety   . Carpal tunnel syndrome on both sides   . Chronic back pain    herniated disc,stenosis,radiculopathy,spondylolisthesis  . Chronic neck pain   . Constipation, chronic   . Degenerative spinal arthritis    cervical and lumbar  . Depression   . Eczema   . GERD (gastroesophageal reflux disease)   . Hemorrhoids   . Hiatal hernia   . History of bronchitis    "I've  had it a number of times"  . History of COVID-19 04/22/2019  . IBS (irritable bowel syndrome)    lactose intolerant  . Osteoarthritis    bilateral knees and shoulders,  little fingers  . TMJ (dislocation of temporomandibular joint)   . Wears glasses     Past Surgical History:  Procedure Laterality Date  . APPENDECTOMY  1967  . BLADDER SUSPENSION  1980's   sling  . BREAST BIOPSY Left 1980's   "twice on the left"  . BREAST REDUCTION SURGERY Bilateral 1988  . CARPAL TUNNEL RELEASE Bilateral 2020  . CERVIX LESION DESTRUCTION  age 53s  . DEBRIDEMENT COMPLEX MUCOID CYST, ARTHROTOMY W/ SYNOVECTOMY Left 07-21-2008  dr sypher [facility]   Left long finger  . DILATION AND CURETTAGE OF UTERUS  1988  . INCISION AND DRAINAGE ABSCESS / HEMATOMA OF BURSA / KNEE / [place]   "day after knee scope"  . KNEE ARTHROSCOPY  1994   unilateral  . POSTERIOR FUSION LUMBAR SPINE  02/2011   L3-L5  . REDUCTION MAMMAPLASTY Bilateral   . TONSILLECTOMY  1951  . TOTAL KNEE ARTHROPLASTY Bilateral 05/27/2017   Procedure: TOTAL KNEE BILATERAL;  Surgeon: Gaynelle Arabian, MD;  Location: [facility];  Service: Orthopedics;  Laterality: Bilateral;    Current Outpatient Medications  Medication Sig Dispense Refill  . famotidine (PEPCID) 20 MG tablet Take 20 mg  by mouth as needed for heartburn or indigestion.    . gabapentin (NEURONTIN) 300 MG capsule Neurontin 300 mg capsule  Take 1 capsule 3 times a day by oral route.    Marland Kitchen ketoconazole (NIZORAL) 2 % shampoo Apply 1 application topically 2 (two) times a week.    . polyethylene glycol (MIRALAX) packet Take 17 g by mouth 3 (three) times daily. Until bowels move; maximum 2 consecutive days (Patient taking differently: Take 17 g by mouth every other day. Until bowels move; maximum 2 consecutive days) 6 each 0  . Vitamin D, Ergocalciferol, (DRISDOL) 1.25 MG (50000 UNIT) CAPS capsule Take 50,000 Units by mouth every 7 (seven) days.     No current facility-administered medications  for this visit.    Family History  Problem Relation Age of Onset  . Kidney disease Father   . Rheum arthritis Father   . Parkinson's disease Mother   . Leukemia Sister   . Colon cancer Maternal Grandfather        questionable  . Breast cancer Paternal Aunt   . Anesthesia problems Neg Hx   . Hypotension Neg Hx   . Malignant hyperthermia Neg Hx   . Pseudochol deficiency Neg Hx     Review of Systems  All other systems reviewed and are negative.   Exam:   BP 120/74   Pulse (!) 56   Temp (!) 97.1 F (36.2 C) (Temporal)   Resp 14   Ht 5\' 5"  (1.651 m)   Wt 155 lb 9.6 oz (70.6 kg)   LMP 04/14/1997   BMI 25.89 kg/m     General appearance: alert, cooperative and appears stated age Head: normocephalic, without obvious abnormality, atraumatic Neck: no adenopathy, supple, symmetrical, trachea midline and thyroid normal to inspection and palpation Lungs: clear to auscultation on right.  Left upper and lower lobes with rhonchi.  Breasts: consistent with breast reduction, no masses or tenderness, No nipple retraction or dimpling, No nipple discharge or bleeding, No axillary adenopathy Heart: regular rate and rhythm Abdomen: soft, non-tender; no masses, no organomegaly Extremities: extremities normal, atraumatic, no cyanosis or edema Skin: skin color, texture, turgor normal. No rashes or lesions Lymph nodes: cervical, supraclavicular, and axillary nodes normal. Neurologic: grossly normal  Pelvic: External genitalia:  no lesions              No abnormal inguinal nodes palpated.              Urethra:  normal appearing urethra with no masses, tenderness or lesions              Bartholins and Skenes: normal                 Vagina: normal appearing vagina with normal color and discharge, no lesions.  Second degree rectocele.               Cervix: no lesions              Pap taken: No. Bimanual Exam:  Uterus:  normal size, contour, position, consistency, mobility, non-tender               Adnexa: no mass, fullness, tenderness              Rectal exam: Yes.  .  Confirms.              Anus:  normal sphincter tone, no lesions  Chaperone was present for exam.  Assessment:   Well woman visit with normal  exam. Breast reduction.  Remote hx of cervical conization. Off HRT.  Hemorrhoids.  Hx urinary incontinence.  Controlled. Hx constipation and fecal incontinence.  Rectocele.  Second degree. Vaginal atrophy.  Recent Covid.    Plan: Mammogram screening discussed. Self breast awareness reviewed. Pap and HR HPV as above. Guidelines for Calcium, Vitamin D, regular exercise program including cardiovascular and weight bearing exercise. We discussed Replens, cooking oils, and vit E suppositories.  We also discussed potential Estring.  She will follow up with her PCP.  Follow up annually and prn.   After visit summary provided.

## 2019-06-01 ENCOUNTER — Ambulatory Visit (INDEPENDENT_AMBULATORY_CARE_PROVIDER_SITE_OTHER): Payer: Medicare Other | Admitting: Obstetrics and Gynecology

## 2019-06-01 ENCOUNTER — Encounter: Payer: Self-pay | Admitting: Obstetrics and Gynecology

## 2019-06-01 VITALS — BP 120/74 | HR 56 | Temp 97.1°F | Resp 14 | Ht 65.0 in | Wt 155.6 lb

## 2019-06-01 DIAGNOSIS — Z01419 Encounter for gynecological examination (general) (routine) without abnormal findings: Secondary | ICD-10-CM | POA: Diagnosis not present

## 2019-06-01 NOTE — Patient Instructions (Signed)

## 2019-11-23 ENCOUNTER — Other Ambulatory Visit: Payer: Self-pay | Admitting: Obstetrics and Gynecology

## 2019-11-23 ENCOUNTER — Other Ambulatory Visit: Payer: Self-pay | Admitting: Internal Medicine

## 2019-11-23 DIAGNOSIS — Z1231 Encounter for screening mammogram for malignant neoplasm of breast: Secondary | ICD-10-CM

## 2019-12-15 ENCOUNTER — Other Ambulatory Visit: Payer: Self-pay | Admitting: Orthopedic Surgery

## 2019-12-15 DIAGNOSIS — M19011 Primary osteoarthritis, right shoulder: Secondary | ICD-10-CM

## 2019-12-27 ENCOUNTER — Ambulatory Visit
Admission: RE | Admit: 2019-12-27 | Discharge: 2019-12-27 | Disposition: A | Discharge status: Home / Self Care | Payer: Medicare Other | Source: Ambulatory Visit | Attending: Orthopedic Surgery | Admitting: Orthopedic Surgery

## 2019-12-27 DIAGNOSIS — M19011 Primary osteoarthritis, right shoulder: Secondary | ICD-10-CM

## 2019-12-29 ENCOUNTER — Other Ambulatory Visit: Payer: Medicare Other

## 2020-01-11 ENCOUNTER — Other Ambulatory Visit: Payer: Self-pay

## 2020-01-11 ENCOUNTER — Ambulatory Visit
Admission: RE | Admit: 2020-01-11 | Discharge: 2020-01-11 | Disposition: A | Discharge status: Home / Self Care | Payer: Medicare Other | Source: Ambulatory Visit | Attending: Internal Medicine | Admitting: Internal Medicine

## 2020-01-11 DIAGNOSIS — Z1231 Encounter for screening mammogram for malignant neoplasm of breast: Secondary | ICD-10-CM

## 2020-01-16 ENCOUNTER — Other Ambulatory Visit: Payer: Self-pay | Admitting: Internal Medicine

## 2020-01-16 DIAGNOSIS — R928 Other abnormal and inconclusive findings on diagnostic imaging of breast: Secondary | ICD-10-CM

## 2020-01-26 ENCOUNTER — Other Ambulatory Visit: Payer: Self-pay

## 2020-01-26 ENCOUNTER — Ambulatory Visit
Admission: RE | Admit: 2020-01-26 | Discharge: 2020-01-26 | Disposition: A | Discharge status: Home / Self Care | Payer: Medicare Other | Source: Ambulatory Visit | Attending: Internal Medicine | Admitting: Internal Medicine

## 2020-01-26 DIAGNOSIS — R928 Other abnormal and inconclusive findings on diagnostic imaging of breast: Secondary | ICD-10-CM

## 2020-02-10 ENCOUNTER — Telehealth: Payer: Self-pay | Admitting: *Deleted

## 2020-02-10 NOTE — Telephone Encounter (Signed)
Patient is in [facility] hold for f/u of right breast mass on screening MMG dated 01/11/20.  Dx right breast MMG and [place] completed at [facility] on 01/26/20  IMPRESSION: No evidence of malignancy. The recently suspected right breast mass is normal dense glandular tissue.  RECOMMENDATION: Bilateral screening mammogram in 1 year.  I have discussed the findings and recommendations with the patient. If applicable, a reminder letter will be sent to the patient regarding the next appointment.  BI-RADS CATEGORY  1: Negative.     Patient removed from MMG hold, routing to Dr. Quincy Simmonds  for final review.

## 2020-02-11 NOTE — Telephone Encounter (Signed)
Ok to remove from mammogram hold and return to routine screening.

## 2020-02-13 NOTE — Telephone Encounter (Signed)
Removed from MMG hold.   Encounter closed.  

## 2020-02-21 NOTE — Progress Notes (Addendum)
DUE TO COVID-19 ONLY ONE VISITOR IS ALLOWED TO COME WITH YOU AND STAY IN THE WAITING ROOM ONLY DURING PRE OP AND PROCEDURE DAY OF SURGERY. THE 1 VISITOR  MAY VISIT WITH YOU AFTER SURGERY IN YOUR PRIVATE ROOM DURING VISITING HOURS ONLY!  YOU NEED TO HAVE A COVID 19 TEST ON___11/15/2021 ____ @_______ , THIS TEST MUST BE DONE BEFORE SURGERY,  COVID TESTING SITE [address] [place] [address], IT IS ON THE RIGHT GOING OUT [address] APPROXIMATELY  2 MINUTES PAST [employer] ON THE RIGHT. ONCE YOUR COVID TEST IS COMPLETED,  PLEASE BEGIN THE QUARANTINE INSTRUCTIONS AS OUTLINED IN YOUR HANDOUT.                Sherry [name]  02/21/2020   Your procedure is scheduled on: 03/01/2020    Report to [facility] Main  Entrance   Report to admitting at    1030 AM     Call this number if you have problems the morning of surgery [phone]    REMEMBER: NO  SOLID FOOD CANDY OR GUM AFTER MIDNIGHT. CLEAR LIQUIDS UNTIL 1000am        . NOTHING BY MOUTH EXCEPT CLEAR LIQUIDS UNTIL    . PLEASE FINISH ENSURE DRINK PER SURGEON ORDER  WHICH NEEDS TO BE COMPLETED AT  1000am    .      CLEAR LIQUID DIET   Foods Allowed                                                                    Coffee and tea, regular and decaf                            Fruit ices (not with fruit pulp)                                      Iced Popsicles                                    Carbonated beverages, regular and diet                                    Cranberry, grape and apple juices Sports drinks like Gatorade Lightly seasoned clear broth or consume(fat free) Sugar, honey syrup ___________________________________________________________________      BRUSH YOUR TEETH MORNING OF SURGERY AND RINSE YOUR MOUTH OUT, NO CHEWING GUM CANDY OR MINTS.     Take these medicines the morning of surgery with A SIP OF WATER: none                                  You may not have any metal on your  body including hair pins and              piercings  Do not wear jewelry, make-up, lotions, powders or perfumes, deodorant  Do not wear nail polish on your fingernails.  Do not shave  48 hours prior to surgery.              Men may shave face and neck.   Do not bring valuables to the hospital. [place].  Contacts, dentures or bridgework may not be worn into surgery.  Leave suitcase in the car. After surgery it may be brought to your room.     Patients discharged the day of surgery will not be allowed to drive home. IF YOU ARE HAVING SURGERY AND GOING HOME THE SAME DAY, YOU MUST HAVE AN ADULT TO DRIVE YOU HOME AND BE WITH YOU FOR 24 HOURS. YOU MAY GO HOME BY TAXI OR UBER OR ORTHERWISE, BUT AN ADULT MUST ACCOMPANY YOU HOME AND STAY WITH YOU FOR 24 HOURS.  Name and phone number of your driver:  Special Instructions: N/A              Please read over the following fact sheets you were given: _____________________________________________________________________  [facility] - Preparing for Surgery Before surgery, you can play an important role.  Because skin is not sterile, your skin needs to be as free of germs as possible.  You can reduce the number of germs on your skin by washing with CHG (chlorahexidine gluconate) soap before surgery.  CHG is an antiseptic cleaner which kills germs and bonds with the skin to continue killing germs even after washing. Please DO NOT use if you have an allergy to CHG or antibacterial soaps.  If your skin becomes reddened/irritated stop using the CHG and inform your nurse when you arrive at Short Stay. Do not shave (including legs and underarms) for at least 48 hours prior to the first CHG shower.  You may shave your face/neck. Please follow these instructions carefully:  1.  Shower with CHG Soap the night before surgery and the  morning of Surgery.  2.  If you choose to wash your hair, wash your hair  first as usual with your  normal  shampoo.  3.  After you shampoo, rinse your hair and body thoroughly to remove the  shampoo.                           4.  Use CHG as you would any other liquid soap.  You can apply chg directly  to the skin and wash                       Gently with a scrungie or clean washcloth.  5.  Apply the CHG Soap to your body ONLY FROM THE NECK DOWN.   Do not use on face/ open                           Wound or open sores. Avoid contact with eyes, ears mouth and genitals (private parts).                       Wash face,  Genitals (private parts) with your normal soap.             6.  Wash thoroughly, paying special attention to the area where your surgery  will be performed.  7.  Thoroughly rinse your body with  warm water from the neck down.  8.  DO NOT shower/wash with your normal soap after using and rinsing off  the CHG Soap.                9.  Pat yourself dry with a clean towel.            10.  Wear clean pajamas.            11.  Place clean sheets on your bed the night of your first shower and do not  sleep with pets. Day of Surgery : Do not apply any lotions/deodorants the morning of surgery.  Please wear clean clothes to the hospital/surgery center.  FAILURE TO FOLLOW THESE INSTRUCTIONS MAY RESULT IN THE CANCELLATION OF YOUR SURGERY PATIENT SIGNATURE_________________________________  NURSE SIGNATURE__________________________________  ________________________________________________________________________   [facility]- Preparing for Total Shoulder Arthroplasty    Before surgery, you can play an important role. Because skin is not sterile, your skin needs to be as free of germs as possible. You can reduce the number of germs on your skin by using the following products. . Benzoyl Peroxide Gel o Reduces the number of germs present on the skin o Applied twice a day to shoulder area starting two days before surgery     ==================================================================  Please follow these instructions carefully:  BENZOYL PEROXIDE 5% GEL  Please do not use if you have an allergy to benzoyl peroxide.   If your skin becomes reddened/irritated stop using the benzoyl peroxide.  Starting two days before surgery, apply as follows: 1. Apply benzoyl peroxide in the morning and at night. Apply after taking a shower. If you are not taking a shower clean entire shoulder front, back, and side along with the armpit with a clean wet washcloth.  2. Place a quarter-sized dollop on your shoulder and rub in thoroughly, making sure to cover the front, back, and side of your shoulder, along with the armpit.   2 days before ____ AM   ____ PM              1 day before ____ AM   ____ PM                         3. Do this twice a day for two days.  (Last application is the night before surgery, AFTER using the CHG soap as described below).  4. Do NOT apply benzoyl peroxide gel on the day of surgery.

## 2020-02-23 ENCOUNTER — Encounter (HOSPITAL_COMMUNITY)
Admission: RE | Admit: 2020-02-23 | Discharge: 2020-02-23 | Disposition: A | Discharge status: Home / Self Care | Payer: Medicare Other | Source: Ambulatory Visit | Attending: Orthopedic Surgery | Admitting: Orthopedic Surgery

## 2020-02-23 ENCOUNTER — Other Ambulatory Visit: Payer: Self-pay

## 2020-02-23 ENCOUNTER — Encounter (HOSPITAL_COMMUNITY): Payer: Self-pay

## 2020-02-23 DIAGNOSIS — Z01812 Encounter for preprocedural laboratory examination: Secondary | ICD-10-CM | POA: Insufficient documentation

## 2020-02-23 LAB — SURGICAL PCR SCREEN
MRSA, PCR: NEGATIVE
Staphylococcus aureus: POSITIVE — AB

## 2020-02-23 LAB — CBC
HCT: 41.9 % (ref 36.0–46.0)
Hemoglobin: 13.6 g/dL (ref 12.0–15.0)
MCH: 32 pg (ref 26.0–34.0)
MCHC: 32.5 g/dL (ref 30.0–36.0)
MCV: 98.6 fL (ref 80.0–100.0)
Platelets: 307 10*3/uL (ref 150–400)
RBC: 4.25 MIL/uL (ref 3.87–5.11)
RDW: 12.9 % (ref 11.5–15.5)
WBC: 5.2 10*3/uL (ref 4.0–10.5)
nRBC: 0 % (ref 0.0–0.2)

## 2020-02-27 ENCOUNTER — Other Ambulatory Visit (HOSPITAL_COMMUNITY)
Admission: RE | Admit: 2020-02-27 | Discharge: 2020-02-27 | Disposition: A | Discharge status: Home / Self Care | Payer: Medicare Other | Source: Ambulatory Visit | Attending: Orthopedic Surgery | Admitting: Orthopedic Surgery

## 2020-02-27 DIAGNOSIS — Z20822 Contact with and (suspected) exposure to covid-19: Secondary | ICD-10-CM | POA: Insufficient documentation

## 2020-02-27 DIAGNOSIS — Z01812 Encounter for preprocedural laboratory examination: Secondary | ICD-10-CM | POA: Insufficient documentation

## 2020-02-27 LAB — SARS CORONAVIRUS 2 (TAT 6-24 HRS): SARS Coronavirus 2: NEGATIVE

## 2020-03-01 ENCOUNTER — Ambulatory Visit (HOSPITAL_COMMUNITY): Payer: Medicare Other | Admitting: Anesthesiology

## 2020-03-01 ENCOUNTER — Encounter (HOSPITAL_COMMUNITY)
Admission: RE | Disposition: A | Discharge status: Home / Self Care | Payer: Self-pay | Source: Ambulatory Visit | Attending: Orthopedic Surgery

## 2020-03-01 ENCOUNTER — Encounter (HOSPITAL_COMMUNITY): Payer: Self-pay | Admitting: Orthopedic Surgery

## 2020-03-01 ENCOUNTER — Ambulatory Visit (HOSPITAL_COMMUNITY)
Admission: RE | Admit: 2020-03-01 | Discharge: 2020-03-01 | Disposition: A | Discharge status: Home / Self Care | Payer: Medicare Other | Source: Ambulatory Visit | Attending: Orthopedic Surgery | Admitting: Orthopedic Surgery

## 2020-03-01 ENCOUNTER — Other Ambulatory Visit: Payer: Self-pay | Admitting: Orthopedic Surgery

## 2020-03-01 DIAGNOSIS — M6281 Muscle weakness (generalized): Secondary | ICD-10-CM | POA: Insufficient documentation

## 2020-03-01 DIAGNOSIS — Z96653 Presence of artificial knee joint, bilateral: Secondary | ICD-10-CM | POA: Diagnosis not present

## 2020-03-01 DIAGNOSIS — Z87891 Personal history of nicotine dependence: Secondary | ICD-10-CM | POA: Diagnosis not present

## 2020-03-01 DIAGNOSIS — Z79899 Other long term (current) drug therapy: Secondary | ICD-10-CM | POA: Diagnosis not present

## 2020-03-01 DIAGNOSIS — M19011 Primary osteoarthritis, right shoulder: Secondary | ICD-10-CM | POA: Insufficient documentation

## 2020-03-01 DIAGNOSIS — Z8261 Family history of arthritis: Secondary | ICD-10-CM | POA: Insufficient documentation

## 2020-03-01 DIAGNOSIS — Z8616 Personal history of COVID-19: Secondary | ICD-10-CM | POA: Insufficient documentation

## 2020-03-01 HISTORY — PX: TOTAL SHOULDER ARTHROPLASTY: SHX126

## 2020-03-01 SURGERY — ARTHROPLASTY, SHOULDER, TOTAL
Anesthesia: General | Site: Shoulder | Laterality: Right

## 2020-03-01 MED ORDER — FENTANYL CITRATE (PF) 100 MCG/2ML IJ SOLN
50.0000 ug | INTRAMUSCULAR | Status: AC
  Administered 2020-03-01: 100 ug via INTRAVENOUS
  Filled 2020-03-01: qty 2

## 2020-03-01 MED ORDER — ONDANSETRON HCL 4 MG/2ML IJ SOLN: 4.0000 mg | Freq: Once | INTRAMUSCULAR | Status: DC | PRN

## 2020-03-01 MED ORDER — ACETAMINOPHEN 160 MG/5ML PO SOLN: 325.0000 mg | ORAL | Status: DC | PRN

## 2020-03-01 MED ORDER — MIDAZOLAM HCL 2 MG/2ML IJ SOLN
1.0000 mg | INTRAMUSCULAR | Status: AC
  Administered 2020-03-01: 2 mg via INTRAVENOUS
  Filled 2020-03-01: qty 2

## 2020-03-01 MED ORDER — ONDANSETRON HCL 4 MG PO TABS: 4.0000 mg | ORAL_TABLET | Freq: Three times a day (TID) | ORAL | 0 refills | Status: DC | PRN

## 2020-03-01 MED ORDER — SUGAMMADEX SODIUM 200 MG/2ML IV SOLN
INTRAVENOUS | Status: DC | PRN
  Administered 2020-03-01: 200 mg via INTRAVENOUS

## 2020-03-01 MED ORDER — SUCCINYLCHOLINE CHLORIDE 200 MG/10ML IV SOSY
PREFILLED_SYRINGE | INTRAVENOUS | Status: DC | PRN
  Administered 2020-03-01: 140 mg via INTRAVENOUS

## 2020-03-01 MED ORDER — KETOROLAC TROMETHAMINE 15 MG/ML IJ SOLN: 15.0000 mg | Freq: Once | INTRAMUSCULAR | Status: DC

## 2020-03-01 MED ORDER — ONDANSETRON HCL 4 MG/2ML IJ SOLN
INTRAMUSCULAR | Status: AC
  Filled 2020-03-01: qty 4

## 2020-03-01 MED ORDER — LIDOCAINE 2% (20 MG/ML) 5 ML SYRINGE
INTRAMUSCULAR | Status: DC | PRN
  Administered 2020-03-01: 100 mg via INTRAVENOUS

## 2020-03-01 MED ORDER — ROCURONIUM BROMIDE 10 MG/ML (PF) SYRINGE
PREFILLED_SYRINGE | INTRAVENOUS | Status: AC
  Filled 2020-03-01: qty 10

## 2020-03-01 MED ORDER — PHENYLEPHRINE HCL-NACL 10-0.9 MG/250ML-% IV SOLN
INTRAVENOUS | Status: DC | PRN
  Administered 2020-03-01 (×2): 20 ug/min via INTRAVENOUS

## 2020-03-01 MED ORDER — PHENYLEPHRINE 40 MCG/ML (10ML) SYRINGE FOR IV PUSH (FOR BLOOD PRESSURE SUPPORT)
PREFILLED_SYRINGE | INTRAVENOUS | Status: AC
  Filled 2020-03-01: qty 10

## 2020-03-01 MED ORDER — FENTANYL CITRATE (PF) 250 MCG/5ML IJ SOLN
INTRAMUSCULAR | Status: AC
  Filled 2020-03-01: qty 5

## 2020-03-01 MED ORDER — ROCURONIUM BROMIDE 10 MG/ML (PF) SYRINGE
PREFILLED_SYRINGE | INTRAVENOUS | Status: AC
  Filled 2020-03-01: qty 20

## 2020-03-01 MED ORDER — PROPOFOL 10 MG/ML IV BOLUS
INTRAVENOUS | Status: AC
  Filled 2020-03-01: qty 20

## 2020-03-01 MED ORDER — OXYCODONE HCL 5 MG/5ML PO SOLN: 5.0000 mg | Freq: Once | ORAL | Status: DC | PRN

## 2020-03-01 MED ORDER — CELECOXIB 200 MG PO CAPS
200.0000 mg | ORAL_CAPSULE | Freq: Every day | ORAL | 1 refills | Status: DC | PRN
Start: 2020-03-01 — End: 2021-06-03

## 2020-03-01 MED ORDER — CYCLOBENZAPRINE HCL 10 MG PO TABS: 10.0000 mg | ORAL_TABLET | Freq: Three times a day (TID) | ORAL | 1 refills | Status: DC | PRN

## 2020-03-01 MED ORDER — EPHEDRINE 5 MG/ML INJ
INTRAVENOUS | Status: AC
  Filled 2020-03-01: qty 10

## 2020-03-01 MED ORDER — TRANEXAMIC ACID-NACL 1000-0.7 MG/100ML-% IV SOLN
1000.0000 mg | INTRAVENOUS | Status: AC
  Administered 2020-03-01: 1000 mg via INTRAVENOUS
  Filled 2020-03-01: qty 100

## 2020-03-01 MED ORDER — DEXAMETHASONE SODIUM PHOSPHATE 10 MG/ML IJ SOLN
INTRAMUSCULAR | Status: DC | PRN
  Administered 2020-03-01: 10 mg via INTRAVENOUS

## 2020-03-01 MED ORDER — ROCURONIUM BROMIDE 10 MG/ML (PF) SYRINGE
PREFILLED_SYRINGE | INTRAVENOUS | Status: DC | PRN
  Administered 2020-03-01: 10 mg via INTRAVENOUS
  Administered 2020-03-01 (×2): 20 mg via INTRAVENOUS

## 2020-03-01 MED ORDER — OXYCODONE HCL 5 MG PO TABS: 5.0000 mg | ORAL_TABLET | Freq: Once | ORAL | Status: DC | PRN

## 2020-03-01 MED ORDER — ACETAMINOPHEN 325 MG PO TABS: 325.0000 mg | ORAL_TABLET | ORAL | Status: DC | PRN

## 2020-03-01 MED ORDER — LIDOCAINE 2% (20 MG/ML) 5 ML SYRINGE
INTRAMUSCULAR | Status: AC
  Filled 2020-03-01: qty 5

## 2020-03-01 MED ORDER — DEXAMETHASONE SODIUM PHOSPHATE 10 MG/ML IJ SOLN
INTRAMUSCULAR | Status: AC
  Filled 2020-03-01: qty 2

## 2020-03-01 MED ORDER — LACTATED RINGERS IV BOLUS
250.0000 mL | Freq: Once | INTRAVENOUS | Status: AC
  Administered 2020-03-01: 250 mL via INTRAVENOUS

## 2020-03-01 MED ORDER — BUPIVACAINE LIPOSOME 1.3 % IJ SUSP
INTRAMUSCULAR | Status: DC | PRN
  Administered 2020-03-01 (×5): 2 mL via PERINEURAL

## 2020-03-01 MED ORDER — CHLORHEXIDINE GLUCONATE 0.12 % MT SOLN
15.0000 mL | Freq: Once | OROMUCOSAL | Status: AC
  Administered 2020-03-01: 15 mL via OROMUCOSAL

## 2020-03-01 MED ORDER — SUCCINYLCHOLINE CHLORIDE 200 MG/10ML IV SOSY
PREFILLED_SYRINGE | INTRAVENOUS | Status: AC
  Filled 2020-03-01: qty 20

## 2020-03-01 MED ORDER — ORAL CARE MOUTH RINSE: 15.0000 mL | Freq: Once | OROMUCOSAL | Status: AC

## 2020-03-01 MED ORDER — PHENYLEPHRINE HCL (PRESSORS) 10 MG/ML IV SOLN
INTRAVENOUS | Status: AC
  Filled 2020-03-01: qty 2

## 2020-03-01 MED ORDER — MEPERIDINE HCL 50 MG/ML IJ SOLN: 6.2500 mg | INTRAMUSCULAR | Status: DC | PRN

## 2020-03-01 MED ORDER — ONDANSETRON HCL 4 MG/2ML IJ SOLN
INTRAMUSCULAR | Status: DC | PRN
  Administered 2020-03-01: 4 mg via INTRAVENOUS

## 2020-03-01 MED ORDER — PROPOFOL 10 MG/ML IV BOLUS
INTRAVENOUS | Status: DC | PRN
  Administered 2020-03-01: 150 mg via INTRAVENOUS

## 2020-03-01 MED ORDER — FENTANYL CITRATE (PF) 100 MCG/2ML IJ SOLN: 25.0000 ug | INTRAMUSCULAR | Status: DC | PRN

## 2020-03-01 MED ORDER — LACTATED RINGERS IV SOLN: INTRAVENOUS | Status: DC

## 2020-03-01 MED ORDER — OXYCODONE-ACETAMINOPHEN 5-325 MG PO TABS
1.0000 | ORAL_TABLET | ORAL | 0 refills | Status: DC | PRN
Start: 2020-03-01 — End: 2021-01-02

## 2020-03-01 MED ORDER — CEFAZOLIN SODIUM-DEXTROSE 2-4 GM/100ML-% IV SOLN
2.0000 g | INTRAVENOUS | Status: AC
  Administered 2020-03-01: 2 g via INTRAVENOUS
  Filled 2020-03-01: qty 100

## 2020-03-01 MED ORDER — BUPIVACAINE-EPINEPHRINE (PF) 0.5% -1:200000 IJ SOLN
INTRAMUSCULAR | Status: DC | PRN
  Administered 2020-03-01 (×5): 3 mL via PERINEURAL

## 2020-03-01 MED ORDER — FENTANYL CITRATE (PF) 250 MCG/5ML IJ SOLN
INTRAMUSCULAR | Status: DC | PRN
  Administered 2020-03-01: 100 ug via INTRAVENOUS

## 2020-03-01 MED ORDER — LACTATED RINGERS IV BOLUS
500.0000 mL | Freq: Once | INTRAVENOUS | Status: AC
  Administered 2020-03-01: 500 mL via INTRAVENOUS

## 2020-03-01 MED ORDER — ACETAMINOPHEN 10 MG/ML IV SOLN: 1000.0000 mg | Freq: Once | INTRAVENOUS | Status: DC | PRN

## 2020-03-01 SURGICAL SUPPLY — 65 items
BAG ZIPLOCK 12X15 (MISCELLANEOUS) IMPLANT
BIT DRILL 2.0X128 (BIT) ×2 IMPLANT
BLADE SAW SGTL 83.5X18.5 (BLADE) ×2 IMPLANT
BODY TRUNION ECLIPSE 41 SL (Shoulder) ×2 IMPLANT
CALIBRATOR GLENOID VIP 5-D (SYSTAGENIX WOUND MANAGEMENT) ×2 IMPLANT
CEMENT BONE DEPUY (Cement) ×2 IMPLANT
COOLER ICEMAN CLASSIC (MISCELLANEOUS) ×2 IMPLANT
COVER BACK TABLE 60X90IN (DRAPES) ×2 IMPLANT
COVER SURGICAL LIGHT HANDLE (MISCELLANEOUS) ×2 IMPLANT
COVER WAND RF STERILE (DRAPES) IMPLANT
DERMABOND ADVANCED (GAUZE/BANDAGES/DRESSINGS) ×1
DERMABOND ADVANCED .7 DNX12 (GAUZE/BANDAGES/DRESSINGS) ×1 IMPLANT
DRAPE ORTHO SPLIT 77X108 STRL (DRAPES) ×2
DRAPE SHEET LG 3/4 BI-LAMINATE (DRAPES) ×2 IMPLANT
DRAPE SURG 17X11 SM STRL (DRAPES) ×2 IMPLANT
DRAPE SURG ORHT 6 SPLT 77X108 (DRAPES) ×2 IMPLANT
DRAPE U-SHAPE 47X51 STRL (DRAPES) ×2 IMPLANT
DRSG AQUACEL AG ADV 3.5X10 (GAUZE/BANDAGES/DRESSINGS) ×2 IMPLANT
DURAPREP 26ML APPLICATOR (WOUND CARE) ×2 IMPLANT
ELECT BLADE TIP CTD 4 INCH (ELECTRODE) ×2 IMPLANT
ELECT REM PT RETURN 15FT ADLT (MISCELLANEOUS) ×2 IMPLANT
FACESHIELD WRAPAROUND (MASK) ×8 IMPLANT
GLENOID WITH CLEAT MEDIUM (Shoulder) ×2 IMPLANT
GLOVE BIO SURGEON STRL SZ7.5 (GLOVE) ×2 IMPLANT
GLOVE BIO SURGEON STRL SZ8 (GLOVE) ×2 IMPLANT
GLOVE SS BIOGEL STRL SZ 7 (GLOVE) ×1 IMPLANT
GLOVE SS BIOGEL STRL SZ 7.5 (GLOVE) ×1 IMPLANT
GLOVE SUPERSENSE BIOGEL SZ 7 (GLOVE) ×1
GLOVE SUPERSENSE BIOGEL SZ 7.5 (GLOVE) ×1
GOWN STRL REUS W/TWL LRG LVL3 (GOWN DISPOSABLE) ×8 IMPLANT
HEAD HUMERAL ECLIPSE 41/16 (Shoulder) ×2 IMPLANT
HEAD HUMERAL USP II 44/17 (Head) ×2 IMPLANT
IMPL ECLIPSE SPEEDCAP (Shoulder) ×1 IMPLANT
IMPLANT ECLIPSE SPEEDCAP (Shoulder) ×2 IMPLANT
KIT BASIN OR (CUSTOM PROCEDURE TRAY) ×2 IMPLANT
KIT TURNOVER KIT A (KITS) ×2 IMPLANT
MANIFOLD NEPTUNE II (INSTRUMENTS) ×2 IMPLANT
NEEDLE TAPERED W/ NITINOL LOOP (MISCELLANEOUS) IMPLANT
NS IRRIG 1000ML POUR BTL (IV SOLUTION) ×2 IMPLANT
PACK SHOULDER (CUSTOM PROCEDURE TRAY) ×2 IMPLANT
PAD COLD SHLDR WRAP-ON (PAD) ×2 IMPLANT
PIN NITINOL TARGETER 2.8 (PIN) ×2 IMPLANT
PIN SET MODULAR GLENOID SYSTEM (PIN) IMPLANT
PROTECTOR NERVE ULNAR (MISCELLANEOUS) ×2 IMPLANT
RESTRAINT HEAD UNIVERSAL NS (MISCELLANEOUS) ×2 IMPLANT
SCREW SM FOR ECLIPSE CAGE 30 (Screw) ×2 IMPLANT
SIZER ECLIPSE CAGE SCREW (ORTHOPEDIC DISPOSABLE SUPPLIES) ×2 IMPLANT
SLING ARM FOAM STRAP LRG (SOFTGOODS) IMPLANT
SLING ARM FOAM STRAP MED (SOFTGOODS) IMPLANT
SMARTMIX MINI TOWER (MISCELLANEOUS) ×2
SPONGE LAP 18X18 RF (DISPOSABLE) IMPLANT
STEM HUM SHLD UNIV 12X151 (Stem) ×2 IMPLANT
SUCTION FRAZIER HANDLE 12FR (TUBING) ×1
SUCTION TUBE FRAZIER 12FR DISP (TUBING) ×1 IMPLANT
SUT FIBERWIRE #2 38 T-5 BLUE (SUTURE)
SUT MNCRL AB 3-0 PS2 18 (SUTURE) ×2 IMPLANT
SUT MON AB 2-0 CT1 36 (SUTURE) ×2 IMPLANT
SUT VIC AB 1 CT1 36 (SUTURE) ×6 IMPLANT
SUTURE FIBERWR #2 38 T-5 BLUE (SUTURE) IMPLANT
SUTURE TAPE 1.3 40 TPR END (SUTURE) IMPLANT
SUTURETAPE 1.3 40 TPR END (SUTURE)
TOWEL OR 17X26 10 PK STRL BLUE (TOWEL DISPOSABLE) ×2 IMPLANT
TOWEL OR NON WOVEN STRL DISP B (DISPOSABLE) ×2 IMPLANT
TOWER SMARTMIX MINI (MISCELLANEOUS) ×1 IMPLANT
WATER STERILE IRR 1000ML POUR (IV SOLUTION) ×4 IMPLANT

## 2020-03-01 NOTE — Evaluation (Signed)
Occupational Therapy Evaluation Patient Details Name: Sherry [name] MRN: [id number] DOB: February 19, 1946 Today's Date: 03/01/2020    History of Present Illness S/p R TSA   Clinical Impression   Mrs. Sherry [name] is a 74 year old woman s/p shoulder replacement without functional use of right dominant upper extremity secondary to effects of surgery and interscalene block and shoulder precautions. Therapist provided education and instruction to patient and spouse in regards to exercises, precautions, positioning, donning upper extremity clothing and bathing while maintaining shoulder precautions, ice and edema management and donning/doffing sling. Patient and spouse verbalized understanding and demonstrated as needed. Handouts provided to maximize retention of education. Patient needed assistance to donn shirt and sling and able to don lower body clothing. Min guard for standing during ADLs due to mild imbalances from medication/surgery. Patient to follow up with MD for further therapy needs.      Follow Up Recommendations  Follow surgeon's recommendation for DC plan and follow-up therapies    Equipment Recommendations  None recommended by OT    Recommendations for Other Services       Precautions / Restrictions Precautions Precautions: Shoulder Type of Shoulder Precautions: May come out of sling if sitting in controlled environment. ie while watching tv, eating etc to give neck and skin break from sling. Please sleep in sling though until 4 weeks post op. Ok to use operative arm to assist in feeding, bathing, ADL's. New PROM restrictions (8/18) for use in hygiene and ADL only ER 20 ABD 45 FE 60 Pendulums are to be gentle and are the preferred exercise to be instructed for patients to perform at home.( Along with elbow wrist and hand exercise) Shoulder Interventions: At all times;Off for dressing/bathing/exercises;Shoulder sling/immobilizer Restrictions Weight Bearing Restrictions: Yes RUE  Weight Bearing: Non weight bearing      Mobility Bed Mobility                    Transfers                      Balance Overall balance assessment: Mild deficits observed, not formally tested                                         ADL either performed or assessed with clinical judgement   ADL Overall ADL's : Needs assistance/impaired Eating/Feeding: Set up   Grooming: Modified independent   Upper Body Bathing: Minimal assistance;Adhering to UE precautions   Lower Body Bathing: Modified independent   Upper Body Dressing : Moderate assistance;Adhering to UE precautions   Lower Body Dressing: Modified independent Lower Body Dressing Details (indicate cue type and reason): increased time to don pants and underwear Toilet Transfer: Min guard   Toileting- Clothing Manipulation and Hygiene: Min guard       Functional mobility during ADLs: Min guard General ADL Comments: min guard for safety secondary to effects of medication     Vision Patient Visual Report: No change from baseline       Perception     Praxis      Pertinent Vitals/Pain Pain Assessment: No/denies pain     Hand Dominance     Extremity/Trunk Assessment Upper Extremity Assessment Upper Extremity Assessment: RUE deficits/detail RUE Deficits / Details: Decreased ROM, strength secondary to effects of interscalene block and shoulder precautions.           Communication  Cognition Arousal/Alertness: Awake/alert Behavior During Therapy: WFL for tasks assessed/performed Overall Cognitive Status: Within Functional Limits for tasks assessed                                     General Comments       Exercises     Shoulder Instructions Shoulder Instructions Donning/doffing shirt without moving shoulder: Caregiver independent with task Method for sponge bathing under operated UE: Caregiver independent with task Donning/doffing  sling/immobilizer: Caregiver independent with task Correct positioning of sling/immobilizer: Caregiver independent with task ROM for elbow, wrist and digits of operated UE: Independent;Caregiver independent with task Proper positioning of operated UE when showering: Caregiver independent with task;Independent Dressing change: Caregiver independent with task;Independent Positioning of UE while sleeping: Caregiver independent with task;[place] expects to be discharged to:: Private residence Living Arrangements: Spouse/significant other                                      Prior Functioning/Environment                   OT Problem List: Decreased strength;Decreased range of motion;Impaired UE functional use      OT Treatment/Interventions:      OT Goals(Current goals can be found in the care plan section) Acute Rehab OT Goals OT Goal Formulation: All assessment and education complete, DC therapy  OT Frequency:     Barriers to D/C:            Co-evaluation              AM-PAC OT "6 Clicks" Daily Activity     Outcome Measure Help from another person eating meals?: A Little Help from another person taking care of personal grooming?: None Help from another person toileting, which includes using toliet, bedpan, or urinal?: A Little Help from another person bathing (including washing, rinsing, drying)?: A Little Help from another person to put on and taking off regular upper body clothing?: A Lot Help from another person to put on and taking off regular lower body clothing?: A Little 6 Click Score: 18   End of Session Nurse Communication:  (OT education complete)  Activity Tolerance: Patient tolerated treatment well Patient left: in chair;with call bell/phone within reach  OT Visit Diagnosis: Muscle weakness (generalized) (M62.81);Pain                Time: 4628-6381 OT Time Calculation (min): 27 min Charges:  OT  General Charges $OT Visit: 1 Visit OT Evaluation $OT Eval Low Complexity: 1 Low OT Treatments $Self Care/Home Management : 8-22 mins  Sherry [name], OTR/L [place] [phone] Pager: [place] 03/01/2020, 5:59 PM

## 2020-03-01 NOTE — Progress Notes (Signed)
Assisted Dr. Hatchett with right, ultrasound guided, interscalene  block. Side rails up, monitors on throughout procedure. See vital signs in flow sheet. Tolerated Procedure well.  

## 2020-03-01 NOTE — Discharge Instructions (Signed)
 Kevin M. Supple, M.D., F.A.A.O.S. Orthopaedic Surgery Specializing in Arthroscopic and Reconstructive Surgery of the Shoulder [phone] [address] - [place], [place] [address] - Fax [phone]   POST-OP TOTAL SHOULDER REPLACEMENT INSTRUCTIONS  1. Follow up in the office for your first post-op appointment 10-14 days from the date of your surgery. If you do not already have a scheduled appointment, our office will contact you to schedule.  2. The bandage over your incision is waterproof. You may begin showering with this dressing on. You may leave this dressing on until first follow up appointment within 2 weeks. We prefer you leave this dressing in place until follow up however after 5-7 days if you are having itching or skin irritation and would like to remove it you may do so. Go slow and tug at the borders gently to break the bond the dressing has with the skin. At this point if there is no drainage it is okay to go without a bandage or you may cover it with a light guaze and tape. You can also expect significant bruising around your shoulder that will drift down your arm and into your chest wall. This is very normal and should resolve over several days.   3. Wear your sling/immobilizer at all times except to perform the exercises below or to occasionally let your arm dangle by your side to stretch your elbow. You also need to sleep in your sling immobilizer until instructed otherwise. It is ok to remove your sling if you are sitting in a controlled environment and allow your arm to rest in a position of comfort by your side or on your lap with pillows to give your neck and skin a break from the sling. You may remove it to allow arm to dangle by side to shower. If you are up walking around and when you go to sleep at night you need to wear it.  4. Range of motion to your elbow, wrist, and hand are encouraged 3-5 times daily. Exercise to your hand and fingers helps to reduce  swelling you may experience.   5. Prescriptions for a pain medication and a muscle relaxant are provided for you. It is recommended that if you are experiencing pain that you pain medication alone is not controlling, add the muscle relaxant along with the pain medication which can give additional pain relief. The first 1-2 days is generally the most severe of your pain and then should gradually decrease. As your pain lessens it is recommended that you decrease your use of the pain medications to an "as needed basis'" only and to always comply with the recommended dosages of the pain medications.  6. Pain medications can produce constipation along with their use. If you experience this, the use of an over the counter stool softener or laxative daily is recommended.   7. For additional questions or concerns, please do not hesitate to call the office. If after hours there is an answering service to forward your concerns to the physician on call.  8.Pain control following an exparel block  To help control your post-operative pain you received a nerve block  performed with Exparel which is a long acting anesthetic (numbing agent) which can provide pain relief and sensations of numbness (and relief of pain) in the operative shoulder and arm for up to 3 days. Sometimes it provides mixed relief, meaning you may still have numbness in certain areas of the arm but can still be able to   move  parts of that arm, hand, and fingers. We recommend that your prescribed pain medications  be used as needed. We do not feel it is necessary to "pre medicate" and "stay ahead" of pain.  Taking narcotic pain medications when you are not having any pain can lead to unnecessary and potentially dangerous side effects.    9. Use the ice machine as much as possible in the first 5-7 days from surgery, then you can wean its use to as needed. The ice typically needs to be replaced every 6 hours, instead of ice you can actually freeze  water bottles to put in the cooler and then fill water around them to avoid having to purchase ice. You can have spare water bottles freezing to allow you to rotate them once they have melted. Try to have a thin shirt or light cloth or towel under the ice wrap to protect your skin.   FOR ADDITIONAL INFO ON ICE MACHINE AND INSTRUCTIONS GO TO THE WEBSITE AT  https://[email or web address].com/products/donjoy/[email or web address]-iceman-[email or web address]  10.  We recommend that you avoid any dental work or cleaning in the first 3 months following your joint replacement. This is to help minimize the possibility of infection from the bacteria in your mouth that enters your bloodstream during dental work. We also recommend that you take an antibiotic prior to your dental work for the first year after your shoulder replacement to further help reduce that risk. Please simply contact our office for antibiotics to be sent to your pharmacy prior to dental work.  11. Dental Antibiotics:  In most cases prophylactic antibiotics for Dental procdeures after total joint surgery are not necessary.  Exceptions are as follows:  1. History of prior total joint infection  2. Severely immunocompromised (Organ Transplant, cancer chemotherapy, Rheumatoid biologic meds such as Humera)  3. Poorly controlled diabetes (A1C &gt; 8.0, blood glucose over 200)  If you have one of these conditions, contact your surgeon for an antibiotic prescription, prior to your dental procedure.   POST-OP EXERCISES  Pendulum Exercises  Perform pendulum exercises while standing and bending at the waist. Support your uninvolved arm on a table or chair and allow your operated arm to hang freely. Make sure to do these exercises passively - not using you shoulder muscles. These exercises can be performed once your nerve block effects have worn off.  Repeat 20 times. Do 3 sessions per day.     

## 2020-03-01 NOTE — Anesthesia Preprocedure Evaluation (Signed)
Anesthesia Evaluation  Patient identified by MRN, date of birth, ID band Patient awake    Reviewed: Allergy & Precautions, NPO status , Patient's Chart, lab work & pertinent test results  Airway Mallampati: I       Dental no notable dental hx.    Pulmonary former smoker,    Pulmonary exam normal        Cardiovascular Normal cardiovascular exam     Neuro/Psych PSYCHIATRIC DISORDERS Anxiety Depression    GI/Hepatic Neg liver ROS, GERD  Medicated,  Endo/Other  negative endocrine ROS  Renal/GU negative Renal ROS     Musculoskeletal  (+) Arthritis , Osteoarthritis,    Abdominal Normal abdominal exam  (+)   Peds  Hematology   Anesthesia Other Findings   Reproductive/Obstetrics                             Anesthesia Physical  Anesthesia Plan  ASA: II  Anesthesia Plan: General   Post-op Pain Management:  Regional for Post-op pain   Induction: Intravenous  PONV Risk Score and Plan: 3  Airway Management Planned: Oral ETT  Additional Equipment: None  Intra-op Plan:   Post-operative Plan: Extubation in OR  Informed Consent: I have reviewed the patients History and Physical, chart, labs and discussed the procedure including the risks, benefits and alternatives for the proposed anesthesia with the patient or authorized representative who has indicated his/her understanding and acceptance.     Dental advisory given  Plan Discussed with: CRNA  Anesthesia Plan Comments:         Anesthesia Quick Evaluation  

## 2020-03-01 NOTE — H&P (Signed)
Sherry [name]    Chief Complaint: Right shoulder osteoarthritis HPI: The patient is a 74 y.o. female with chronic and progressively increasing right shoulder pain related to severe osteoarthritis.  Due to her increasing functional imitations and failure to respond to conservative management she is brought to the operating room this time for planned right anatomic total shoulder arthroplasty.  Past Medical History:  Diagnosis Date  . Anxiety   . Carpal tunnel syndrome on both sides   . Chronic back pain    herniated disc,stenosis,radiculopathy,spondylolisthesis  . Chronic neck pain   . Constipation, chronic   . Degenerative spinal arthritis    cervical and lumbar  . Depression   . Eczema   . GERD (gastroesophageal reflux disease)   . Hemorrhoids   . Hiatal hernia   . History of bronchitis    "I've had it a number of times"  . History of COVID-19 04/22/2019  . IBS (irritable bowel syndrome)    lactose intolerant  . Osteoarthritis    bilateral knees and shoulders,  little fingers  . TMJ (dislocation of temporomandibular joint)   . Wears glasses     Past Surgical History:  Procedure Laterality Date  . APPENDECTOMY  1967  . BACK SURGERY    . BLADDER SUSPENSION  1980's   sling  . BREAST BIOPSY Left 1980's   "twice on the left"  . BREAST LUMPECTOMY     x 2  . BREAST REDUCTION SURGERY Bilateral 1988  . CARPAL TUNNEL RELEASE Bilateral 2020  . CERVIX LESION DESTRUCTION  age 63s  . DEBRIDEMENT COMPLEX MUCOID CYST, ARTHROTOMY W/ SYNOVECTOMY Left 07-21-2008  dr sypher [facility]   Left long finger  . DILATION AND CURETTAGE OF UTERUS  1988  . INCISION AND DRAINAGE ABSCESS / HEMATOMA OF BURSA / KNEE / [place]   "day after knee scope"  . JOINT REPLACEMENT     bilateral knees   . KNEE ARTHROSCOPY  1994   unilateral  . POSTERIOR FUSION LUMBAR SPINE  02/2011   L3-L5  . REDUCTION MAMMAPLASTY Bilateral   . TONSILLECTOMY  1951  . TOTAL KNEE ARTHROPLASTY Bilateral 05/27/2017    Procedure: TOTAL KNEE BILATERAL;  Surgeon: Gaynelle Arabian, MD;  Location: [facility];  Service: Orthopedics;  Laterality: Bilateral;    Family History  Problem Relation Age of Onset  . Kidney disease Father   . Rheum arthritis Father   . Parkinson's disease Mother   . Leukemia Sister   . Colon cancer Maternal Grandfather        questionable  . Breast cancer Paternal Aunt   . Anesthesia problems Neg Hx   . Hypotension Neg Hx   . Malignant hyperthermia Neg Hx   . Pseudochol deficiency Neg Hx     Social History:  reports that she quit smoking about 51 years ago. Her smoking use included cigarettes. She quit after 4.00 years of use. She has never used smokeless tobacco. She reports current alcohol use of about 5.0 - 6.0 standard drinks of alcohol per week. She reports that she does not use drugs.   Medications Prior to Admission  Medication Sig Dispense Refill  . acetaminophen (TYLENOL) 325 MG tablet Take 325 mg by mouth every 6 (six) hours as needed for moderate pain or headache.    . celecoxib (CELEBREX) 200 MG capsule Take 200 mg by mouth daily as needed for pain.    . famotidine (PEPCID) 20 MG tablet Take 20 mg by mouth as needed for  heartburn or indigestion.    . gabapentin (NEURONTIN) 300 MG capsule Take 300 mg by mouth at bedtime.     . naproxen sodium (ALEVE) 220 MG tablet Take 220-440 mg by mouth daily as needed (pain).    . polyethylene glycol (MIRALAX) packet Take 17 g by mouth 3 (three) times daily. Until bowels move; maximum 2 consecutive days (Patient taking differently: Take 8.5-17 g by mouth daily. ) 6 each 0  . Vitamin D, Ergocalciferol, (DRISDOL) 1.25 MG (50000 UNIT) CAPS capsule Take 50,000 Units by mouth every Monday.     Marland Kitchen ketoconazole (NIZORAL) 2 % cream Apply 1 application topically daily as needed (scaly skin).    Marland Kitchen ketoconazole (NIZORAL) 2 % shampoo Apply 1 application topically daily as needed (itchy scalp).        Physical Exam: Right shoulder demonstrates  painful and guarded motion as noted at her recent office visits.  She does maintain good strength to manual muscle testing.  Preoperative x-rays and CT scans demonstrate complete obliteration of the joint space with a B2 glenoid and moderate posterior erosion although the humeral head is relatively well centered on the glenoid.  The CT scan has been used to allow for intraoperative alignment of the glenoid.  Vitals  Temp:  [98 F (36.7 C)] 98 F (36.7 C) (11/18 1121) Pulse Rate:  [62-70] 62 (11/18 1251) Resp:  [12-24] 19 (11/18 1251) BP: (138-141)/(71-78) 141/78 (11/18 1246) SpO2:  [98 %-100 %] 99 % (11/18 1251) Weight:  [67.1 kg] 67.1 kg (11/18 1133)  Assessment/Plan  Impression: Right shoulder osteoarthritis  Plan of Action: Procedure(s): Right shoulder stemless anatomic arthroplasty  Joley Utecht M Teyah Rossy 03/01/2020, 12:57 PM Contact # [phone]

## 2020-03-01 NOTE — Op Note (Signed)
03/01/2020  3:59 PM  PATIENT:   Sherry [name]  74 y.o. female  PRE-OPERATIVE DIAGNOSIS:  Right shoulder osteoarthritis  POST-OPERATIVE DIAGNOSIS: Same  PROCEDURE: Right shoulder anatomic arthroplasty utilizing a press-fit size 10 Arthrex universe 2 stem with a 44 x 17 eccentric humeral head and a medium glenoid  SURGEON:  Marquette Blodgett, Metta Clines M.D.  ASSISTANTS: Jenetta Loges, PA-C  ANESTHESIA:   General endotracheal and interscalene block with Exparel  EBL: 100 cc  SPECIMEN: None  Drains: None   PATIENT DISPOSITION:  PACU - hemodynamically stable.    PLAN OF CARE: Discharge to home after PACU  Brief history:  Patient is a 74 year old female who has had chronic and progressively increasing right shoulder pain related to severe osteoarthritis.  Her symptoms have deteriorated such that she is experiencing significant functional limitations with symptoms that have failed to respond to prolonged attempts at conservative management.  She is brought to the operating this time for planned right shoulder anatomic arthroplasty.  Preoperatively, I counseled the patient regarding treatment options and risks versus benefits thereof.  Possible surgical complications were all reviewed including potential for bleeding, infection, neurovascular injury, persistent pain, loss of motion, anesthetic complication, failure of the implant, and possible need for additional surgery. They understand and accept and agrees with our planned procedure.   Procedure in detail:  After undergoing routine preop evaluation the patient received prophylactic antibiotics and interscalene block with Exparel established in the holding area by the anesthesia department.  Patient was subsequently placed supine on the operating table and underwent the smooth induction of a general endotracheal anesthesia.  Placed into the beachchair position and appropriately padded and protected.  The right shoulder girdle region was  sterilely prepped and draped in standard fashion.  Timeout was called.  Anterior deltopectoral approach to the right shoulder is made through an 8 cm incision.  Skin flaps were elevated dissection carried deeply and the deltopectoral interval was developed from proximal to distal with the vein taken laterally.  The upper centimeter the pectoralis major was tenotomized for exposure and the conjoined tendon was then retracted medially and the adhesions were divided beneath the deltoid.  The long head biceps tendon was then tenodesed at the upper border the pectoralis major tendon with proximal segment excised.  The rotator cuff was then split along the rotator interval to the base of the coracoid and the insertion of the subscapularis was then identified and an oscillating saw was then used to perform a lesser tuberosity osteotomy with the subscapularis then mobilized tagged and reflected medially.  Capsular attachments were then divided from the anterior and inferior margins of the humeral neck allowing deliver the humeral head through the wound.  Extra medullary guide was then utilized to outline our proposed humeral head resection at approximate 20 degrees retroversion.  This was performed with an oscillating saw taking care to protect the rotator cuff superiorly and posteriorly.  The large osteophytes were then removed from the rim of the humeral neck and the humeral metaphysis was then sized for what we initially planned to use a stemless implant.  This was sized at a 43 and a metal cap was then placed over the proximal humeral surface.  We then exposed the glenoid with appropriate retractors and performed a circumferential labral resection as well as extensive synovectomy.  A B2 glenoid with moderate retroversion was noted as we had identified on the preop CT scan we utilized the VIP guide to direct a guidepin into the center  of the glenoid and the glenoid was then reamed to a stable subchondral bone bed.  It  was terminally prepared with the central drill followed by the superior and inferior peg and slot respectively.  The glenoid was then broached and a trial showed excellent fit and fixation.  The glenoid was irrigated cleaned and dried cement was then introduced into the superior and inferior peg and slot respectively and bone graft was impacted about the central peg and the glenoid was then impacted with excellent fit and fixation.  We then returned our attention to the proximal humerus where we completed preparation with our trunnion and cage screw and performed trial reduction and ultimately selected a 43 x 18 head.  This was impacted then as I was performing my final reduction the humeral head became levered against the glenoid and the reduction in the head destabilized and became completely loose.  With this finding we went ahead and removed the stemless implant and converted to a standard universe to stem.  At this time the canal was then prepared hand reaming and then broached up to a size 10.  We did place suture anchors for our subscap repair and these were maintained and the size 10 stem was then impacted with excellent fit and fixation at approximately 30 degrees retroversion and the proximal locking screws were then tightened.  I did place a suture about the collar of the implant to use as an accessory medial row anchor for our subscap repair.  Once the implant was properly seated trial reductions were then performed and this time it 44 x 17 head gave [place] the best soft tissue balance with 50% translation good stability.  The [facility] taper was then cleaned and dried.  The humeral head was then impacted on the [facility] taper and final reduction was performed showing excellent motion good stability good soft tissue balance good tissue tension.  We then mobilized the subscapularis achieving excellent elasticity.  The lesser tuberosity osteotomy was then reapproximated and our 3 medial row anchors were then passed  through the bone tendon junction.  Our rotator cuff repair was initiated with a stitch at the apex of the rotator interval and then the subsequent sutures transfixing the LTO were sutured in a side-to-side fashion which allowed multiple strands traversing the bone fragment allowing excellent reapposition compression and stability.  The rotator interval was then repaired with suture tape sutures.  Upon completion the arm easily achieved 45 degrees of external rotation without excessive tension on the subscapularis repair.  Wound was copes irrigated.  Hemostasis was obtained.  The deltopectoral interval was repaired with a series of figure-of-eight and 1 Vicryl sutures.  2-0 Vicryl used with subcu layer and intracuticular 3-0 Monocryl for the skin followed by Dermabond and Aquasol dressing.  Right arm was then placed into a sling and the patient was awakened, extubated, and taken to the recovery room in stable condition.  Jenetta Loges, PA-C was utilized as an Environmental consultant throughout this case, essential for help with positioning the patient, positioning extremity, tissue manipulation, implantation of the prosthesis, suture management, wound closure, and intraoperative decision-making.  Marin Shutter MD   Contact # [phone]

## 2020-03-01 NOTE — Anesthesia Procedure Notes (Signed)
Procedure Name: Intubation Date/Time: 03/01/2020 1:36 PM Performed by: Cynda Familia, CRNA Pre-anesthesia Checklist: Patient identified, Emergency Drugs available, Suction available and Patient being monitored Patient Re-evaluated:Patient Re-evaluated prior to induction Oxygen Delivery Method: Circle System Utilized Preoxygenation: Pre-oxygenation with 100% oxygen Induction Type: IV induction Ventilation: Mask ventilation without difficulty Laryngoscope Size: Miller and 2 Grade View: Grade I Tube type: Oral Tube size: 7.0 mm Number of attempts: 1 Airway Equipment and Method: Stylet Placement Confirmation: ETT inserted through vocal cords under direct vision,  positive ETCO2 and breath sounds checked- equal and bilateral Secured at: 21 cm Tube secured with: Tape Dental Injury: Teeth and Oropharynx as per pre-operative assessment  Difficulty Due To: Difficulty was anticipated, Difficult Airway- due to anterior larynx, Difficult Airway- due to reduced neck mobility and Difficult Airway- due to limited oral opening Future Recommendations: Recommend- induction with short-acting agent, and alternative techniques readily available Comments: Smooth IV induction [place]-- intubation AM CRNA atraumatic-- teeth and mouth as preop -- bilat BS [place] -CONSIDER DIFFICULT INTUBATION-- ANTERIOR --NO NECK OR JAW MOBILITY

## 2020-03-01 NOTE — Transfer of Care (Signed)
Immediate Anesthesia Transfer of Care Note  Patient: MINSA WEDDINGTON  Procedure(s) Performed: Right shoulder anatomic arthroplasty (Right Shoulder)  Patient Location: PACU  Anesthesia Type:General  Level of Consciousness: sedated  Airway & Oxygen Therapy: Patient Spontanous Breathing and Patient connected to face mask oxygen  Post-op Assessment: Report given to RN and Post -op Vital signs reviewed and stable  Post vital signs: Reviewed and stable  Last Vitals:  Vitals Value Taken Time  BP 133/77 03/01/20 1604  Temp    Pulse 80 03/01/20 1606  Resp 8 03/01/20 1606  SpO2 100 % 03/01/20 1606  Vitals shown include unvalidated device data.  Last Pain:  Vitals:   03/01/20 1133  TempSrc:   PainSc: 3          Complications: No complications documented.

## 2020-03-01 NOTE — Anesthesia Postprocedure Evaluation (Signed)
Anesthesia Post Note  Patient: Sherry [name]  Procedure(s) Performed: Right shoulder anatomic arthroplasty (Right Shoulder)     Patient location during evaluation: Phase II Anesthesia Type: General Level of consciousness: awake Pain management: pain level controlled Vital Signs Assessment: post-procedure vital signs reviewed and stable Respiratory status: spontaneous breathing Cardiovascular status: stable Postop Assessment: no apparent nausea or vomiting Anesthetic complications: no   No complications documented.  Last Vitals:  Vitals:   03/01/20 1645 03/01/20 1650  BP: 137/76 (!) 152/72  Pulse: 73 75  Resp: 13 16  Temp: 36.4 C 36.4 C  SpO2: 94% 96%    Last Pain:  Vitals:   03/01/20 1650  TempSrc:   PainSc: 0-No pain                 Huston Foley

## 2020-03-01 NOTE — Anesthesia Procedure Notes (Signed)
Anesthesia Regional Block: Interscalene brachial plexus block   Pre-Anesthetic Checklist: ,, timeout performed, Correct Patient, Correct Site, Correct Laterality, Correct Procedure, Correct Position, site marked, Risks and benefits discussed,  Surgical consent,  Pre-op evaluation,  At surgeon's request and post-op pain management  Laterality: Right and Upper  Prep: chloraprep       Needles:  Injection technique: Single-shot  Needle Type: Echogenic Stimulator Needle     Needle Length: 9cm  Needle Gauge: 20   Needle insertion depth: 1 cm   Additional Needles:   Procedures:,,,, ultrasound used (permanent image in chart),,,,  Narrative:  Start time: 03/01/2020 1:05 PM End time: 03/01/2020 1:15 PM Injection made incrementally with aspirations every 5 mL.  Performed by: Personally  Anesthesiologist: Lyn Hollingshead, MD

## 2020-03-13 ENCOUNTER — Encounter (HOSPITAL_COMMUNITY): Payer: Self-pay | Admitting: Orthopedic Surgery

## 2020-04-16 DIAGNOSIS — M25611 Stiffness of right shoulder, not elsewhere classified: Secondary | ICD-10-CM | POA: Diagnosis not present

## 2020-04-18 DIAGNOSIS — M1811 Unilateral primary osteoarthritis of first carpometacarpal joint, right hand: Secondary | ICD-10-CM | POA: Diagnosis not present

## 2020-04-18 DIAGNOSIS — M19011 Primary osteoarthritis, right shoulder: Secondary | ICD-10-CM | POA: Diagnosis not present

## 2020-04-18 DIAGNOSIS — M79642 Pain in left hand: Secondary | ICD-10-CM | POA: Diagnosis not present

## 2020-04-18 DIAGNOSIS — M19012 Primary osteoarthritis, left shoulder: Secondary | ICD-10-CM | POA: Diagnosis not present

## 2020-04-19 DIAGNOSIS — H35371 Puckering of macula, right eye: Secondary | ICD-10-CM | POA: Diagnosis not present

## 2020-04-20 DIAGNOSIS — M25611 Stiffness of right shoulder, not elsewhere classified: Secondary | ICD-10-CM | POA: Diagnosis not present

## 2020-04-23 DIAGNOSIS — M25611 Stiffness of right shoulder, not elsewhere classified: Secondary | ICD-10-CM | POA: Diagnosis not present

## 2020-04-26 DIAGNOSIS — M25611 Stiffness of right shoulder, not elsewhere classified: Secondary | ICD-10-CM | POA: Diagnosis not present

## 2020-05-03 DIAGNOSIS — M25611 Stiffness of right shoulder, not elsewhere classified: Secondary | ICD-10-CM | POA: Diagnosis not present

## 2020-05-07 DIAGNOSIS — M25611 Stiffness of right shoulder, not elsewhere classified: Secondary | ICD-10-CM | POA: Diagnosis not present

## 2020-05-09 DIAGNOSIS — M25611 Stiffness of right shoulder, not elsewhere classified: Secondary | ICD-10-CM | POA: Diagnosis not present

## 2020-05-15 DIAGNOSIS — M25611 Stiffness of right shoulder, not elsewhere classified: Secondary | ICD-10-CM | POA: Diagnosis not present

## 2020-05-17 DIAGNOSIS — M25611 Stiffness of right shoulder, not elsewhere classified: Secondary | ICD-10-CM | POA: Diagnosis not present

## 2020-05-21 DIAGNOSIS — Z96611 Presence of right artificial shoulder joint: Secondary | ICD-10-CM | POA: Diagnosis not present

## 2020-06-04 ENCOUNTER — Ambulatory Visit: Payer: Medicare Other | Admitting: Obstetrics and Gynecology

## 2020-06-12 DIAGNOSIS — H2511 Age-related nuclear cataract, right eye: Secondary | ICD-10-CM | POA: Diagnosis not present

## 2020-06-26 DIAGNOSIS — H2512 Age-related nuclear cataract, left eye: Secondary | ICD-10-CM | POA: Diagnosis not present

## 2020-07-04 DIAGNOSIS — J45909 Unspecified asthma, uncomplicated: Secondary | ICD-10-CM | POA: Diagnosis not present

## 2020-07-04 DIAGNOSIS — K219 Gastro-esophageal reflux disease without esophagitis: Secondary | ICD-10-CM | POA: Diagnosis not present

## 2020-07-04 DIAGNOSIS — K589 Irritable bowel syndrome without diarrhea: Secondary | ICD-10-CM | POA: Diagnosis not present

## 2020-07-04 DIAGNOSIS — Z1389 Encounter for screening for other disorder: Secondary | ICD-10-CM | POA: Diagnosis not present

## 2020-07-04 DIAGNOSIS — Z0000 Encounter for general adult medical examination without abnormal findings: Secondary | ICD-10-CM | POA: Diagnosis not present

## 2020-07-04 DIAGNOSIS — G4700 Insomnia, unspecified: Secondary | ICD-10-CM | POA: Diagnosis not present

## 2020-07-04 DIAGNOSIS — Z79899 Other long term (current) drug therapy: Secondary | ICD-10-CM | POA: Diagnosis not present

## 2020-07-04 DIAGNOSIS — Z1239 Encounter for other screening for malignant neoplasm of breast: Secondary | ICD-10-CM | POA: Diagnosis not present

## 2020-07-04 DIAGNOSIS — Z1211 Encounter for screening for malignant neoplasm of colon: Secondary | ICD-10-CM | POA: Diagnosis not present

## 2020-07-04 DIAGNOSIS — K648 Other hemorrhoids: Secondary | ICD-10-CM | POA: Diagnosis not present

## 2020-07-04 DIAGNOSIS — K5900 Constipation, unspecified: Secondary | ICD-10-CM | POA: Diagnosis not present

## 2020-07-04 DIAGNOSIS — E559 Vitamin D deficiency, unspecified: Secondary | ICD-10-CM | POA: Diagnosis not present

## 2020-08-31 DIAGNOSIS — M19012 Primary osteoarthritis, left shoulder: Secondary | ICD-10-CM | POA: Diagnosis not present

## 2020-08-31 DIAGNOSIS — M25512 Pain in left shoulder: Secondary | ICD-10-CM | POA: Diagnosis not present

## 2020-08-31 DIAGNOSIS — Z96611 Presence of right artificial shoulder joint: Secondary | ICD-10-CM | POA: Diagnosis not present

## 2020-09-04 ENCOUNTER — Other Ambulatory Visit: Payer: Self-pay | Admitting: Orthopedic Surgery

## 2020-09-04 DIAGNOSIS — M19012 Primary osteoarthritis, left shoulder: Secondary | ICD-10-CM

## 2020-09-14 DIAGNOSIS — K5900 Constipation, unspecified: Secondary | ICD-10-CM | POA: Diagnosis not present

## 2020-09-14 DIAGNOSIS — K219 Gastro-esophageal reflux disease without esophagitis: Secondary | ICD-10-CM | POA: Diagnosis not present

## 2020-09-14 DIAGNOSIS — R109 Unspecified abdominal pain: Secondary | ICD-10-CM | POA: Diagnosis not present

## 2020-09-14 DIAGNOSIS — K644 Residual hemorrhoidal skin tags: Secondary | ICD-10-CM | POA: Diagnosis not present

## 2020-09-17 DIAGNOSIS — M1811 Unilateral primary osteoarthritis of first carpometacarpal joint, right hand: Secondary | ICD-10-CM | POA: Diagnosis not present

## 2020-09-17 DIAGNOSIS — M79642 Pain in left hand: Secondary | ICD-10-CM | POA: Diagnosis not present

## 2020-09-20 DIAGNOSIS — K648 Other hemorrhoids: Secondary | ICD-10-CM | POA: Diagnosis not present

## 2020-09-20 DIAGNOSIS — K641 Second degree hemorrhoids: Secondary | ICD-10-CM | POA: Diagnosis not present

## 2020-11-21 DIAGNOSIS — K648 Other hemorrhoids: Secondary | ICD-10-CM | POA: Diagnosis not present

## 2020-11-23 DIAGNOSIS — R051 Acute cough: Secondary | ICD-10-CM | POA: Diagnosis not present

## 2020-11-23 DIAGNOSIS — J019 Acute sinusitis, unspecified: Secondary | ICD-10-CM | POA: Diagnosis not present

## 2020-11-28 ENCOUNTER — Other Ambulatory Visit: Payer: Self-pay

## 2020-11-28 ENCOUNTER — Ambulatory Visit
Admission: RE | Admit: 2020-11-28 | Discharge: 2020-11-28 | Disposition: A | Discharge status: Home / Self Care | Payer: Medicare Other | Source: Ambulatory Visit | Attending: Orthopedic Surgery | Admitting: Orthopedic Surgery

## 2020-11-28 DIAGNOSIS — J019 Acute sinusitis, unspecified: Secondary | ICD-10-CM | POA: Diagnosis not present

## 2020-11-28 DIAGNOSIS — M19012 Primary osteoarthritis, left shoulder: Secondary | ICD-10-CM

## 2020-11-28 DIAGNOSIS — M25412 Effusion, left shoulder: Secondary | ICD-10-CM | POA: Diagnosis not present

## 2020-11-28 DIAGNOSIS — Z01818 Encounter for other preprocedural examination: Secondary | ICD-10-CM | POA: Diagnosis not present

## 2020-12-05 ENCOUNTER — Ambulatory Visit
Admission: RE | Admit: 2020-12-05 | Discharge: 2020-12-05 | Disposition: A | Discharge status: Home / Self Care | Payer: Medicare Other | Source: Ambulatory Visit | Attending: Physician Assistant | Admitting: Physician Assistant

## 2020-12-05 ENCOUNTER — Other Ambulatory Visit: Payer: Self-pay | Admitting: Physician Assistant

## 2020-12-05 DIAGNOSIS — R519 Headache, unspecified: Secondary | ICD-10-CM

## 2020-12-05 DIAGNOSIS — R002 Palpitations: Secondary | ICD-10-CM | POA: Diagnosis not present

## 2020-12-05 DIAGNOSIS — R42 Dizziness and giddiness: Secondary | ICD-10-CM | POA: Diagnosis not present

## 2020-12-05 DIAGNOSIS — Z8709 Personal history of other diseases of the respiratory system: Secondary | ICD-10-CM | POA: Diagnosis not present

## 2020-12-24 DIAGNOSIS — H10413 Chronic giant papillary conjunctivitis, bilateral: Secondary | ICD-10-CM | POA: Diagnosis not present

## 2020-12-25 DIAGNOSIS — M25512 Pain in left shoulder: Secondary | ICD-10-CM | POA: Diagnosis not present

## 2020-12-25 DIAGNOSIS — M19012 Primary osteoarthritis, left shoulder: Secondary | ICD-10-CM | POA: Diagnosis not present

## 2020-12-27 ENCOUNTER — Other Ambulatory Visit: Payer: Self-pay | Admitting: Internal Medicine

## 2020-12-27 DIAGNOSIS — Z1231 Encounter for screening mammogram for malignant neoplasm of breast: Secondary | ICD-10-CM

## 2021-01-04 NOTE — Patient Instructions (Signed)
DUE TO COVID-19 ONLY ONE VISITOR IS ALLOWED TO COME WITH YOU AND STAY IN THE WAITING ROOM ONLY DURING PRE OP AND PROCEDURE.   **NO VISITORS ARE ALLOWED IN THE SHORT STAY AREA OR RECOVERY ROOM!!**  IF YOU WILL BE ADMITTED INTO THE HOSPITAL YOU ARE ALLOWED ONLY TWO SUPPORT PEOPLE DURING VISITATION HOURS ONLY (10AM -8PM)   The support person(s) may change daily. The support person(s) must pass our screening, gel in and out, and wear a mask at all times, including in the patient's room. Patients must also wear a mask when staff or their support person are in the room.  No visitors under the age of 110. Any visitor under the age of 90 must be accompanied by an adult.     Your procedure is scheduled on: 01/17/21   Report to [facility] Main Entrance   Report to Short Stay at 5:15 AM   [facility])   Call this number if you have problems the morning of surgery [phone]   Do not eat food :After Midnight.   May have liquids until: 4:30 AM   day of surgery  CLEAR LIQUID DIET  Foods Allowed                                                                     Foods Excluded  Water, Black Coffee and tea, regular and decaf                             liquids that you cannot  Plain Jell-O in any flavor  (No red)                                           see through such as: Fruit ices (not with fruit pulp)                                     milk, soups, orange juice              Iced Popsicles (No red)                                    All solid food                                   Apple juices Sports drinks like Gatorade (No red) Lightly seasoned clear broth or consume(fat free) Sugar,   Sample Menu Breakfast                                Lunch                                     Supper Cranberry juice  Beef broth                            Chicken broth Jell-O                                     Grape juice                           Apple juice Coffee or  tea                        Jell-O                                      Popsicle                                                Coffee or tea                        Coffee or tea      Complete one Ensure drink the morning of surgery at : 4:30 AM.      the day of surgery.   Drink 2 Ensure drinks the night before surgery.  Complete one Ensure drink the morning of surgery 3 hours prior to scheduled surgery.     The day of surgery:  Drink ONE (1) Pre-Surgery Clear Ensure or G2 by am the morning of surgery. Drink in one sitting. Do not sip.  This drink was given to you during your hospital  pre-op appointment visit. Nothing else to drink after completing the  Pre-Surgery Clear Ensure or G2.          If you have questions, please contact your surgeon's office.     Oral Hygiene is also important to reduce your risk of infection.                                    Remember - BRUSH YOUR TEETH THE MORNING OF SURGERY WITH YOUR REGULAR TOOTHPASTE   Do NOT smoke after Midnight   Take these medicines the morning of surgery with A SIP OF WATER: famotidine as needed.  DO NOT TAKE ANY ORAL DIABETIC MEDICATIONS DAY OF YOUR SURGERY                              You may not have any metal on your body including hair pins, jewelry, and body piercing             Do not wear make-up, lotions, powders, perfumes/cologne, or deodorant  Do not wear nail polish including gel and S&S, artificial/acrylic nails, or any other type of covering on natural nails including finger and toenails. If you have artificial nails, gel coating, etc. that needs to be removed by a nail salon please have this removed prior to surgery or surgery may need to be canceled/ delayed if the surgeon/ anesthesia feels like they are unable to be safely  monitored.   Do not shave  48 hours prior to surgery.    Do not bring valuables to the hospital. [place].   Contacts, dentures or  bridgework may not be worn into surgery.   Bring small overnight bag day of surgery.    Patients discharged on the day of surgery will not be allowed to drive home.   Special Instructions: Bring a copy of your healthcare power of attorney and living will documents         the day of surgery if you haven't scanned them before.              Please read over the following fact sheets you were given: IF YOU HAVE QUESTIONS ABOUT YOUR PRE-OP INSTRUCTIONS PLEASE CALL [phone]   [facility] - Preparing for Surgery Before surgery, you can play an important role.  Because skin is not sterile, your skin needs to be as free of germs as possible.  You can reduce the number of germs on your skin by washing with CHG (chlorahexidine gluconate) soap before surgery.  CHG is an antiseptic cleaner which kills germs and bonds with the skin to continue killing germs even after washing. Please DO NOT use if you have an allergy to CHG or antibacterial soaps.  If your skin becomes reddened/irritated stop using the CHG and inform your nurse when you arrive at Short Stay. Do not shave (including legs and underarms) for at least 48 hours prior to the first CHG shower.  You may shave your face/neck. Please follow these instructions carefully:  1.  Shower with CHG Soap the night before surgery and the  morning of Surgery.  2.  If you choose to wash your hair, wash your hair first as usual with your  normal  shampoo.  3.  After you shampoo, rinse your hair and body thoroughly to remove the  shampoo.                           4.  Use CHG as you would any other liquid soap.  You can apply chg directly  to the skin and wash                       Gently with a scrungie or clean washcloth.  5.  Apply the CHG Soap to your body ONLY FROM THE NECK DOWN.   Do not use on face/ open                           Wound or open sores. Avoid contact with eyes, ears mouth and genitals (private parts).                       Wash face,   Genitals (private parts) with your normal soap.             6.  Wash thoroughly, paying special attention to the area where your surgery  will be performed.  7.  Thoroughly rinse your body with warm water from the neck down.  8.  DO NOT shower/wash with your normal soap after using and rinsing off  the CHG Soap.                9.  Pat yourself dry  with a clean towel.            10.  Wear clean pajamas.            11.  Place clean sheets on your bed the night of your first shower and do not  sleep with pets. Day of Surgery : Do not apply any lotions/deodorants the morning of surgery.  Please wear clean clothes to the hospital/surgery center.  FAILURE TO FOLLOW THESE INSTRUCTIONS MAY RESULT IN THE CANCELLATION OF YOUR SURGERY PATIENT SIGNATURE_________________________________  NURSE SIGNATURE__________________________________  ________________________________________________________________________ [facility]- Preparing for Total Shoulder Arthroplasty    Before surgery, you can play an important role. Because skin is not sterile, your skin needs to be as free of germs as possible. You can reduce the number of germs on your skin by using the following products. Benzoyl Peroxide Gel Reduces the number of germs present on the skin Applied twice a day to shoulder area starting two days before surgery    ==================================================================  Please follow these instructions carefully:  BENZOYL PEROXIDE 5% GEL  Please do not use if you have an allergy to benzoyl peroxide.   If your skin becomes reddened/irritated stop using the benzoyl peroxide.  Starting two days before surgery, apply as follows: Apply benzoyl peroxide in the morning and at night. Apply after taking a shower. If you are not taking a shower clean entire shoulder front, back, and side along with the armpit with a clean wet washcloth.  Place a quarter-sized dollop on your shoulder and rub in  thoroughly, making sure to cover the front, back, and side of your shoulder, along with the armpit.   2 days before ____ AM   ____ PM              1 day before ____ AM   ____ PM                         Do this twice a day for two days.  (Last application is the night before surgery, AFTER using the CHG soap as described below).  Do NOT apply benzoyl peroxide gel on the day of surgery.   Incentive Spirometer  An incentive spirometer is a tool that can help keep your lungs clear and active. This tool measures how well you are filling your lungs with each breath. Taking long deep breaths may help reverse or decrease the chance of developing breathing (pulmonary) problems (especially infection) following: A long period of time when you are unable to move or be active. BEFORE THE PROCEDURE  If the spirometer includes an indicator to show your best effort, your nurse or respiratory therapist will set it to a desired goal. If possible, sit up straight or lean slightly forward. Try not to slouch. Hold the incentive spirometer in an upright position. INSTRUCTIONS FOR USE  Sit on the edge of your bed if possible, or sit up as far as you can in bed or on a chair. Hold the incentive spirometer in an upright position. Breathe out normally. Place the mouthpiece in your mouth and seal your lips tightly around it. Breathe in slowly and as deeply as possible, raising the piston or the ball toward the top of the column. Hold your breath for 3-5 seconds or for as long as possible. Allow the piston or ball to fall to the bottom of the column. Remove the mouthpiece from your mouth and breathe out normally. Rest for a few  seconds and repeat Steps 1 through 7 at least 10 times every 1-2 hours when you are awake. Take your time and take a few normal breaths between deep breaths. The spirometer may include an indicator to show your best effort. Use the indicator as a goal to work toward during each  repetition. After each set of 10 deep breaths, practice coughing to be sure your lungs are clear. If you have an incision (the cut made at the time of surgery), support your incision when coughing by placing a pillow or rolled up towels firmly against it. Once you are able to get out of bed, walk around indoors and cough well. You may stop using the incentive spirometer when instructed by your caregiver.  RISKS AND COMPLICATIONS Take your time so you do not get dizzy or light-headed. If you are in pain, you may need to take or ask for pain medication before doing incentive spirometry. It is harder to take a deep breath if you are having pain. AFTER USE Rest and breathe slowly and easily. It can be helpful to keep track of a log of your progress. Your caregiver can provide you with a simple table to help with this. If you are using the spirometer at home, follow these instructions: [place] IF:  You are having difficultly using the spirometer. You have trouble using the spirometer as often as instructed. Your pain medication is not giving enough relief while using the spirometer. You develop fever of 100.5 F (38.1 C) or higher. SEEK IMMEDIATE MEDICAL CARE IF:  You cough up bloody sputum that had not been present before. You develop fever of 102 F (38.9 C) or greater. You develop worsening pain at or near the incision site. MAKE SURE YOU:  Understand these instructions. Will watch your condition. Will get help right away if you are not doing well or get worse. Document Released: 08/11/2006 Document Revised: 06/23/2011 Document Reviewed: 10/12/2006 [facility] Patient Information 2014 [place], [place].   ________________________________________________________________________

## 2021-01-08 ENCOUNTER — Other Ambulatory Visit: Payer: Self-pay

## 2021-01-08 ENCOUNTER — Encounter (HOSPITAL_COMMUNITY)
Admission: RE | Admit: 2021-01-08 | Discharge: 2021-01-08 | Disposition: A | Discharge status: Home / Self Care | Payer: Medicare Other | Source: Ambulatory Visit | Attending: Orthopedic Surgery | Admitting: Orthopedic Surgery

## 2021-01-08 ENCOUNTER — Encounter (HOSPITAL_COMMUNITY): Payer: Self-pay

## 2021-01-08 DIAGNOSIS — E119 Type 2 diabetes mellitus without complications: Secondary | ICD-10-CM | POA: Insufficient documentation

## 2021-01-08 DIAGNOSIS — Z01812 Encounter for preprocedural laboratory examination: Secondary | ICD-10-CM | POA: Insufficient documentation

## 2021-01-08 HISTORY — DX: Malignant (primary) neoplasm, unspecified: C80.1

## 2021-01-08 LAB — SURGICAL PCR SCREEN
MRSA, PCR: NEGATIVE
Staphylococcus aureus: NEGATIVE

## 2021-01-08 LAB — CBC
HCT: 44.6 % (ref 36.0–46.0)
Hemoglobin: 14.9 g/dL (ref 12.0–15.0)
MCH: 32 pg (ref 26.0–34.0)
MCHC: 33.4 g/dL (ref 30.0–36.0)
MCV: 95.9 fL (ref 80.0–100.0)
Platelets: 368 10*3/uL (ref 150–400)
RBC: 4.65 MIL/uL (ref 3.87–5.11)
RDW: 13 % (ref 11.5–15.5)
WBC: 5.4 10*3/uL (ref 4.0–10.5)
nRBC: 0 % (ref 0.0–0.2)

## 2021-01-08 NOTE — Progress Notes (Addendum)
COVID Vaccine Completed: Yes Date COVID Vaccine completed: 07/2019. X 2 COVID vaccine manufacturer:   Moderna    COVID Test: N/A  PCP - Dr. Josetta Huddle Cardiologist -   Chest x-ray -  EKG -  Stress Test -  ECHO -  Cardiac Cath -  Pacemaker/ICD device last checked:  Sleep Study -  CPAP -   Fasting Blood Sugar -  Checks Blood Sugar _____ times a day  Blood Thinner Instructions: Aspirin Instructions: Last Dose:  Anesthesia review:   Patient denies shortness of breath, fever, cough and chest pain at PAT appointment   Patient verbalized understanding of instructions that were given to them at the PAT appointment. Patient was also instructed that they will need to review over the PAT instructions again at home before surgery.

## 2021-01-17 ENCOUNTER — Encounter (HOSPITAL_COMMUNITY)
Admission: RE | Disposition: A | Discharge status: Home / Self Care | Payer: Self-pay | Source: Ambulatory Visit | Attending: Orthopedic Surgery

## 2021-01-17 ENCOUNTER — Ambulatory Visit (HOSPITAL_COMMUNITY): Payer: Medicare Other | Admitting: Certified Registered"

## 2021-01-17 ENCOUNTER — Ambulatory Visit (HOSPITAL_COMMUNITY)
Admission: RE | Admit: 2021-01-17 | Discharge: 2021-01-17 | Disposition: A | Discharge status: Home / Self Care | Payer: Medicare Other | Source: Ambulatory Visit | Attending: Orthopedic Surgery | Admitting: Orthopedic Surgery

## 2021-01-17 ENCOUNTER — Encounter (HOSPITAL_COMMUNITY): Payer: Self-pay | Admitting: Orthopedic Surgery

## 2021-01-17 DIAGNOSIS — D62 Acute posthemorrhagic anemia: Secondary | ICD-10-CM | POA: Diagnosis not present

## 2021-01-17 DIAGNOSIS — Z8616 Personal history of COVID-19: Secondary | ICD-10-CM | POA: Diagnosis not present

## 2021-01-17 DIAGNOSIS — M19012 Primary osteoarthritis, left shoulder: Secondary | ICD-10-CM | POA: Insufficient documentation

## 2021-01-17 DIAGNOSIS — Z96611 Presence of right artificial shoulder joint: Secondary | ICD-10-CM | POA: Insufficient documentation

## 2021-01-17 DIAGNOSIS — Z87891 Personal history of nicotine dependence: Secondary | ICD-10-CM | POA: Insufficient documentation

## 2021-01-17 DIAGNOSIS — K219 Gastro-esophageal reflux disease without esophagitis: Secondary | ICD-10-CM | POA: Diagnosis not present

## 2021-01-17 DIAGNOSIS — Z79899 Other long term (current) drug therapy: Secondary | ICD-10-CM | POA: Diagnosis not present

## 2021-01-17 DIAGNOSIS — Z96653 Presence of artificial knee joint, bilateral: Secondary | ICD-10-CM | POA: Diagnosis not present

## 2021-01-17 DIAGNOSIS — G8918 Other acute postprocedural pain: Secondary | ICD-10-CM | POA: Diagnosis not present

## 2021-01-17 HISTORY — PX: TOTAL SHOULDER ARTHROPLASTY: SHX126

## 2021-01-17 SURGERY — ARTHROPLASTY, SHOULDER, TOTAL
Anesthesia: General | Site: Shoulder | Laterality: Left

## 2021-01-17 MED ORDER — MIDAZOLAM HCL 5 MG/5ML IJ SOLN
INTRAMUSCULAR | Status: DC | PRN
  Administered 2021-01-17 (×2): 1 mg via INTRAVENOUS

## 2021-01-17 MED ORDER — PHENYLEPHRINE 40 MCG/ML (10ML) SYRINGE FOR IV PUSH (FOR BLOOD PRESSURE SUPPORT)
PREFILLED_SYRINGE | INTRAVENOUS | Status: AC
  Filled 2021-01-17: qty 10

## 2021-01-17 MED ORDER — SUGAMMADEX SODIUM 200 MG/2ML IV SOLN
INTRAVENOUS | Status: DC | PRN
  Administered 2021-01-17: 200 mg via INTRAVENOUS

## 2021-01-17 MED ORDER — OXYCODONE-ACETAMINOPHEN 5-325 MG PO TABS
1.0000 | ORAL_TABLET | Freq: Once | ORAL | Status: AC
Start: 2021-01-17 — End: 2021-01-17
  Administered 2021-01-17: 1 via ORAL

## 2021-01-17 MED ORDER — OXYCODONE-ACETAMINOPHEN 5-325 MG PO TABS
ORAL_TABLET | ORAL | Status: AC
  Filled 2021-01-17: qty 1

## 2021-01-17 MED ORDER — LACTATED RINGERS IV SOLN: INTRAVENOUS | Status: DC

## 2021-01-17 MED ORDER — ROPIVACAINE HCL 5 MG/ML IJ SOLN
INTRAMUSCULAR | Status: DC | PRN
  Administered 2021-01-17: 10 mL via PERINEURAL

## 2021-01-17 MED ORDER — BUPIVACAINE HCL (PF) 0.5 % IJ SOLN
INTRAMUSCULAR | Status: DC | PRN
  Administered 2021-01-17: 20 mL via PERINEURAL

## 2021-01-17 MED ORDER — ROCURONIUM BROMIDE 10 MG/ML (PF) SYRINGE
PREFILLED_SYRINGE | INTRAVENOUS | Status: DC | PRN
  Administered 2021-01-17: 7 mg via INTRAVENOUS

## 2021-01-17 MED ORDER — VANCOMYCIN HCL 1000 MG IV SOLR
INTRAVENOUS | Status: DC | PRN
  Administered 2021-01-17: 1000 mg via TOPICAL

## 2021-01-17 MED ORDER — PHENYLEPHRINE 40 MCG/ML (10ML) SYRINGE FOR IV PUSH (FOR BLOOD PRESSURE SUPPORT)
PREFILLED_SYRINGE | INTRAVENOUS | Status: DC | PRN
  Administered 2021-01-17: 80 ug via INTRAVENOUS

## 2021-01-17 MED ORDER — PROMETHAZINE HCL 25 MG/ML IJ SOLN: 6.2500 mg | INTRAMUSCULAR | Status: DC | PRN

## 2021-01-17 MED ORDER — CEFAZOLIN SODIUM-DEXTROSE 2-4 GM/100ML-% IV SOLN
2.0000 g | INTRAVENOUS | Status: AC
  Administered 2021-01-17: 2 g via INTRAVENOUS
  Filled 2021-01-17: qty 100

## 2021-01-17 MED ORDER — MIDAZOLAM HCL 2 MG/2ML IJ SOLN
INTRAMUSCULAR | Status: AC
  Filled 2021-01-17: qty 2

## 2021-01-17 MED ORDER — CHLORHEXIDINE GLUCONATE 0.12 % MT SOLN
15.0000 mL | Freq: Once | OROMUCOSAL | Status: AC
  Administered 2021-01-17: 15 mL via OROMUCOSAL

## 2021-01-17 MED ORDER — ORAL CARE MOUTH RINSE: 15.0000 mL | Freq: Once | OROMUCOSAL | Status: AC

## 2021-01-17 MED ORDER — DIPHENHYDRAMINE HCL 12.5 MG/5ML PO ELIX
12.5000 mg | ORAL_SOLUTION | ORAL | Status: DC | PRN
  Filled 2021-01-17: qty 10

## 2021-01-17 MED ORDER — LACTATED RINGERS IV BOLUS: 500.0000 mL | Freq: Once | INTRAVENOUS | Status: DC

## 2021-01-17 MED ORDER — MENTHOL 3 MG MT LOZG: 1.0000 | LOZENGE | OROMUCOSAL | Status: DC | PRN

## 2021-01-17 MED ORDER — DEXAMETHASONE SODIUM PHOSPHATE 4 MG/ML IJ SOLN
INTRAMUSCULAR | Status: DC | PRN
Start: 2021-01-17 — End: 2021-01-17
  Administered 2021-01-17: 1 mg via INTRAVENOUS

## 2021-01-17 MED ORDER — FENTANYL CITRATE PF 50 MCG/ML IJ SOSY: 50.0000 ug | PREFILLED_SYRINGE | Freq: Once | INTRAMUSCULAR | Status: AC

## 2021-01-17 MED ORDER — PHENOL 1.4 % MT LIQD: 1.0000 | OROMUCOSAL | Status: DC | PRN

## 2021-01-17 MED ORDER — FENTANYL CITRATE (PF) 100 MCG/2ML IJ SOLN
INTRAMUSCULAR | Status: DC | PRN
  Administered 2021-01-17 (×2): 50 ug via INTRAVENOUS

## 2021-01-17 MED ORDER — 0.9 % SODIUM CHLORIDE (POUR BTL) OPTIME
TOPICAL | Status: DC | PRN
  Administered 2021-01-17: 1000 mL

## 2021-01-17 MED ORDER — OXYCODONE-ACETAMINOPHEN 5-325 MG PO TABS: 1.0000 | ORAL_TABLET | ORAL | 0 refills | Status: DC | PRN

## 2021-01-17 MED ORDER — DOCUSATE SODIUM 100 MG PO CAPS: 100.0000 mg | ORAL_CAPSULE | Freq: Two times a day (BID) | ORAL | Status: DC

## 2021-01-17 MED ORDER — HYDROMORPHONE HCL 1 MG/ML IJ SOLN: 0.2500 mg | INTRAMUSCULAR | Status: DC | PRN

## 2021-01-17 MED ORDER — ALUM & MAG HYDROXIDE-SIMETH 200-200-20 MG/5ML PO SUSP: 30.0000 mL | ORAL | Status: DC | PRN

## 2021-01-17 MED ORDER — BISACODYL 5 MG PO TBEC: 5.0000 mg | DELAYED_RELEASE_TABLET | Freq: Every day | ORAL | Status: DC | PRN

## 2021-01-17 MED ORDER — LACTATED RINGERS IV BOLUS
250.0000 mL | Freq: Once | INTRAVENOUS | Status: AC
  Administered 2021-01-17: 250 mL via INTRAVENOUS

## 2021-01-17 MED ORDER — CYCLOBENZAPRINE HCL 10 MG PO TABS: 10.0000 mg | ORAL_TABLET | Freq: Three times a day (TID) | ORAL | 1 refills | Status: DC | PRN

## 2021-01-17 MED ORDER — FLEET ENEMA 7-19 GM/118ML RE ENEM: 1.0000 | ENEMA | Freq: Once | RECTAL | Status: DC | PRN

## 2021-01-17 MED ORDER — ONDANSETRON HCL 4 MG/2ML IJ SOLN
INTRAMUSCULAR | Status: AC
  Filled 2021-01-17: qty 2

## 2021-01-17 MED ORDER — STERILE WATER FOR IRRIGATION IR SOLN
Status: DC | PRN
  Administered 2021-01-17: 2000 mL

## 2021-01-17 MED ORDER — LACTATED RINGERS IV SOLN
INTRAVENOUS | Status: DC
Start: 2021-01-17 — End: 2021-01-17

## 2021-01-17 MED ORDER — LIDOCAINE 2% (20 MG/ML) 5 ML SYRINGE
INTRAMUSCULAR | Status: DC | PRN
  Administered 2021-01-17: 60 mg via INTRAVENOUS

## 2021-01-17 MED ORDER — PHENYLEPHRINE HCL-NACL 20-0.9 MG/250ML-% IV SOLN
INTRAVENOUS | Status: DC | PRN
  Administered 2021-01-17: 30 ug/min via INTRAVENOUS

## 2021-01-17 MED ORDER — PHENYLEPHRINE HCL (PRESSORS) 10 MG/ML IV SOLN
INTRAVENOUS | Status: AC
  Filled 2021-01-17: qty 2

## 2021-01-17 MED ORDER — PROPOFOL 10 MG/ML IV BOLUS
INTRAVENOUS | Status: AC
  Filled 2021-01-17: qty 20

## 2021-01-17 MED ORDER — DEXAMETHASONE SODIUM PHOSPHATE 10 MG/ML IJ SOLN
INTRAMUSCULAR | Status: AC
  Filled 2021-01-17: qty 1

## 2021-01-17 MED ORDER — AMISULPRIDE (ANTIEMETIC) 5 MG/2ML IV SOLN
10.0000 mg | Freq: Once | INTRAVENOUS | Status: DC | PRN
Start: 2021-01-17 — End: 2021-01-17

## 2021-01-17 MED ORDER — PANTOPRAZOLE SODIUM 40 MG PO TBEC: 40.0000 mg | DELAYED_RELEASE_TABLET | Freq: Every day | ORAL | Status: DC

## 2021-01-17 MED ORDER — ONDANSETRON HCL 4 MG/2ML IJ SOLN: 4.0000 mg | Freq: Four times a day (QID) | INTRAMUSCULAR | Status: DC | PRN

## 2021-01-17 MED ORDER — TRANEXAMIC ACID-NACL 1000-0.7 MG/100ML-% IV SOLN
1000.0000 mg | INTRAVENOUS | Status: AC
  Administered 2021-01-17: 1000 mg via INTRAVENOUS
  Filled 2021-01-17: qty 100

## 2021-01-17 MED ORDER — BUPIVACAINE LIPOSOME 1.3 % IJ SUSP
INTRAMUSCULAR | Status: DC | PRN
  Administered 2021-01-17: 10 mL via PERINEURAL

## 2021-01-17 MED ORDER — FENTANYL CITRATE (PF) 100 MCG/2ML IJ SOLN
INTRAMUSCULAR | Status: AC
  Filled 2021-01-17: qty 2

## 2021-01-17 MED ORDER — METOCLOPRAMIDE HCL 5 MG PO TABS: 5.0000 mg | ORAL_TABLET | Freq: Three times a day (TID) | ORAL | Status: DC | PRN

## 2021-01-17 MED ORDER — VANCOMYCIN HCL 1000 MG IV SOLR
INTRAVENOUS | Status: AC
  Filled 2021-01-17: qty 20

## 2021-01-17 MED ORDER — METOCLOPRAMIDE HCL 5 MG/ML IJ SOLN: 5.0000 mg | Freq: Three times a day (TID) | INTRAMUSCULAR | Status: DC | PRN

## 2021-01-17 MED ORDER — ONDANSETRON HCL 4 MG/2ML IJ SOLN
INTRAMUSCULAR | Status: DC | PRN
  Administered 2021-01-17: 4 mg via INTRAVENOUS

## 2021-01-17 MED ORDER — TRANEXAMIC ACID 1000 MG/10ML IV SOLN: 1000.0000 mg | INTRAVENOUS | Status: DC

## 2021-01-17 MED ORDER — PROPOFOL 10 MG/ML IV BOLUS
INTRAVENOUS | Status: DC | PRN
  Administered 2021-01-17: 50 mg via INTRAVENOUS
  Administered 2021-01-17: 150 mg via INTRAVENOUS

## 2021-01-17 MED ORDER — POLYETHYLENE GLYCOL 3350 17 G PO PACK: 17.0000 g | PACK | Freq: Every day | ORAL | Status: DC | PRN

## 2021-01-17 MED ORDER — FENTANYL CITRATE PF 50 MCG/ML IJ SOSY
PREFILLED_SYRINGE | INTRAMUSCULAR | Status: AC
  Administered 2021-01-17: 50 ug via INTRAVENOUS
  Filled 2021-01-17: qty 3

## 2021-01-17 MED ORDER — MEPERIDINE HCL 50 MG/ML IJ SOLN: 6.2500 mg | INTRAMUSCULAR | Status: DC | PRN

## 2021-01-17 MED ORDER — ONDANSETRON HCL 4 MG PO TABS: 4.0000 mg | ORAL_TABLET | Freq: Three times a day (TID) | ORAL | 0 refills | Status: DC | PRN

## 2021-01-17 MED ORDER — ONDANSETRON HCL 4 MG PO TABS: 4.0000 mg | ORAL_TABLET | Freq: Four times a day (QID) | ORAL | Status: DC | PRN

## 2021-01-17 MED ORDER — TRAMADOL HCL 50 MG PO TABS: 50.0000 mg | ORAL_TABLET | Freq: Four times a day (QID) | ORAL | 0 refills | Status: DC | PRN

## 2021-01-17 SURGICAL SUPPLY — 65 items
BAG COUNTER SPONGE SURGICOUNT (BAG) IMPLANT
BAG ZIPLOCK 12X15 (MISCELLANEOUS) ×2 IMPLANT
BIT DRILL 2.0X128 (BIT) IMPLANT
BLADE SAW SGTL 83.5X18.5 (BLADE) ×2 IMPLANT
BODY TRUNION ECLIPSE 41 SL (Shoulder) ×2 IMPLANT
CALIBRATOR GLENOID VIP 5-D (SYSTAGENIX WOUND MANAGEMENT) ×2 IMPLANT
CEMENT BONE DEPUY (Cement) ×2 IMPLANT
COOLER ICEMAN CLASSIC (MISCELLANEOUS) IMPLANT
COVER BACK TABLE 60X90IN (DRAPES) ×2 IMPLANT
COVER SURGICAL LIGHT HANDLE (MISCELLANEOUS) ×2 IMPLANT
DERMABOND ADVANCED (GAUZE/BANDAGES/DRESSINGS) ×1
DERMABOND ADVANCED .7 DNX12 (GAUZE/BANDAGES/DRESSINGS) ×1 IMPLANT
DRAPE ORTHO SPLIT 77X108 STRL (DRAPES) ×4
DRAPE SHEET LG 3/4 BI-LAMINATE (DRAPES) ×2 IMPLANT
DRAPE SURG 17X11 SM STRL (DRAPES) ×2 IMPLANT
DRAPE SURG ORHT 6 SPLT 77X108 (DRAPES) ×2 IMPLANT
DRAPE TOP 10253 STERILE (DRAPES) ×2 IMPLANT
DRAPE U-SHAPE 47X51 STRL (DRAPES) ×2 IMPLANT
DRSG AQUACEL AG ADV 3.5X 6 (GAUZE/BANDAGES/DRESSINGS) ×2 IMPLANT
DRSG AQUACEL AG ADV 3.5X10 (GAUZE/BANDAGES/DRESSINGS) ×2 IMPLANT
DURAPREP 26ML APPLICATOR (WOUND CARE) ×2 IMPLANT
ELECT BLADE TIP CTD 4 INCH (ELECTRODE) ×2 IMPLANT
ELECT REM PT RETURN 15FT ADLT (MISCELLANEOUS) ×2 IMPLANT
FACESHIELD WRAPAROUND (MASK) ×8 IMPLANT
GLENOID WITH CLEAT MEDIUM (Shoulder) ×2 IMPLANT
GLOVE SRG 8 PF TXTR STRL LF DI (GLOVE) ×1 IMPLANT
GLOVE SURG ENC MOIS LTX SZ7 (GLOVE) ×2 IMPLANT
GLOVE SURG ENC MOIS LTX SZ7.5 (GLOVE) ×2 IMPLANT
GLOVE SURG ENC MOIS LTX SZ8 (GLOVE) ×2 IMPLANT
GLOVE SURG UNDER POLY LF SZ7 (GLOVE) ×2 IMPLANT
GLOVE SURG UNDER POLY LF SZ8 (GLOVE) ×1
GOWN STRL REUS W/TWL LRG LVL3 (GOWN DISPOSABLE) ×4 IMPLANT
HEAD HUMERAL ECLIPSE 41/18 (Shoulder) ×2 IMPLANT
IMPL ECLIPSE SPEEDCAP (Shoulder) ×1 IMPLANT
IMPLANT ECLIPSE SPEEDCAP (Shoulder) ×2 IMPLANT
KIT BASIN OR (CUSTOM PROCEDURE TRAY) ×2 IMPLANT
KIT TURNOVER KIT A (KITS) ×2 IMPLANT
MANIFOLD NEPTUNE II (INSTRUMENTS) ×2 IMPLANT
NEEDLE TAPERED W/ NITINOL LOOP (MISCELLANEOUS) IMPLANT
NS IRRIG 1000ML POUR BTL (IV SOLUTION) ×2 IMPLANT
PACK SHOULDER (CUSTOM PROCEDURE TRAY) ×2 IMPLANT
PAD COLD SHLDR WRAP-ON (PAD) IMPLANT
PIN NITINOL TARGETER 2.8 (PIN) IMPLANT
PIN SET MODULAR GLENOID SYSTEM (PIN) IMPLANT
PROTECTOR NERVE ULNAR (MISCELLANEOUS) ×2 IMPLANT
RESTRAINT HEAD UNIVERSAL NS (MISCELLANEOUS) ×2 IMPLANT
SCREW MED ECLIPSE 35 (Screw) ×2 IMPLANT
SLING ARM FOAM STRAP LRG (SOFTGOODS) IMPLANT
SLING ARM FOAM STRAP MED (SOFTGOODS) ×2 IMPLANT
SMARTMIX MINI TOWER (MISCELLANEOUS)
SPONGE T-LAP 18X18 ~~LOC~~+RFID (SPONGE) IMPLANT
SUCTION FRAZIER HANDLE 12FR (TUBING) ×2
SUCTION TUBE FRAZIER 12FR DISP (TUBING) ×1 IMPLANT
SUT FIBERWIRE #2 38 T-5 BLUE (SUTURE)
SUT MNCRL AB 3-0 PS2 18 (SUTURE) ×2 IMPLANT
SUT MON AB 2-0 CT1 36 (SUTURE) ×2 IMPLANT
SUT VIC AB 1 CT1 36 (SUTURE) ×2 IMPLANT
SUTURE FIBERWR #2 38 T-5 BLUE (SUTURE) IMPLANT
SUTURE TAPE 1.3 40 TPR END (SUTURE) IMPLANT
SUTURETAPE 1.3 40 TPR END (SUTURE)
TOWEL OR 17X26 10 PK STRL BLUE (TOWEL DISPOSABLE) ×2 IMPLANT
TOWEL OR NON WOVEN STRL DISP B (DISPOSABLE) ×2 IMPLANT
TOWER SMARTMIX MINI (MISCELLANEOUS) IMPLANT
WATER STERILE IRR 1000ML POUR (IV SOLUTION) ×4 IMPLANT
YANKAUER SUCT BULB TIP 10FT TU (MISCELLANEOUS) ×2 IMPLANT

## 2021-01-17 NOTE — Evaluation (Signed)
Occupational Therapy Evaluation Patient Details Name: Sherry [name] MRN: [id number] DOB: 04-08-1946 Today's Date: 01/17/2021   History of Present Illness Patient is a 75 year old female s/p L total shoulder arthroplasty. PMH includes breast lumpectomy, bilateral knee replacements, R shoulder replacement   Clinical Impression   Patient is a 75 year old female s/p shoulder replacement without functional use of left non-dominant upper extremity secondary to effects of surgery and interscalene block and shoulder precautions. Therapist provided education and instruction to patient and spouse in regards to exercises, precautions, positioning, donning upper extremity clothing and bathing while maintaining shoulder precautions, ice and edema management and donning/doffing sling. Patient and spouse verbalized understanding and demonstrated as needed. Patient needed assistance to donn shirt and provided with instruction on compensatory strategies to perform ADLs. Patient to follow up with MD for further therapy needs.        Recommendations for follow up therapy are one component of a multi-disciplinary discharge planning process, led by the attending physician.  Recommendations may be updated based on patient status, additional functional criteria and insurance authorization.   Follow Up Recommendations  Follow surgeon's recommendation for DC plan and follow-up therapies    Equipment Recommendations  None recommended by OT       Precautions / Restrictions Precautions Precautions: Shoulder Type of Shoulder Precautions: May come out of sling if sitting in controlled environment. ie while watching tv, eating etc to give neck and skin break from sling. Please sleep in sling though until 4 weeks post op.     Ok to use operative arm to assist in feeding, bathing, ADL's.   PROM restrictions for use in hygiene and ADL only  ER 20   ABD 45   FE 60 Pendulums are to be gentle and are the preferred exercise to  be instructed for patients to perform at home.( Along with elbow wrist and hand exercise) Shoulder Interventions: Shoulder sling/immobilizer;Off for dressing/bathing/exercises Precaution Booklet Issued: Yes (comment) Required Braces or Orthoses: Sling Restrictions Weight Bearing Restrictions: Yes LUE Weight Bearing: Non weight bearing      Mobility Bed Mobility                    Transfers Overall transfer level: Independent Equipment used: None                  Balance Overall balance assessment: No apparent balance deficits (not formally assessed)                                         ADL either performed or assessed with clinical judgement   ADL Overall ADL's : Needs assistance/impaired Eating/Feeding: Independent   Grooming: Independent   Upper Body Bathing: Minimal assistance;Standing   Lower Body Bathing: Independent   Upper Body Dressing : Minimal assistance;Standing Upper Body Dressing Details (indicate cue type and reason): to thread L UE due to nerve block Lower Body Dressing: Independent;Sit to/from stand;Sitting/lateral leans Lower Body Dressing Details (indicate cue type and reason): able to thread LEs into udnerwear + pants and pull up over hips without assistance Toilet Transfer: Independent;Ambulation   Toileting- Clothing Manipulation and Hygiene: Independent       Functional mobility during ADLs: Independent General ADL Comments: patient and spouse educated in compensatory strategies for self care tasks in order to maintain shoulder precautions.      Pertinent Vitals/Pain Pain Assessment: Faces Faces  Pain Scale: Hurts a little bit Pain Location: L UE Pain Descriptors / Indicators: Heaviness;Numbness Pain Intervention(s): Monitored during session     Hand Dominance Right   Extremity/Trunk Assessment Upper Extremity Assessment Upper Extremity Assessment: LUE deficits/detail LUE Deficits / Details: + nerve  block   Lower Extremity Assessment Lower Extremity Assessment: Overall WFL for tasks assessed   Cervical / Trunk Assessment Cervical / Trunk Assessment: Normal   Communication Communication Communication: No difficulties   Cognition Arousal/Alertness: Awake/alert Behavior During Therapy: WFL for tasks assessed/performed Overall Cognitive Status: Within Functional Limits for tasks assessed                                        Exercises Exercises: Shoulder   Shoulder Instructions Shoulder Instructions Donning/doffing shirt without moving shoulder: Minimal assistance;Caregiver independent with task;Patient able to independently direct caregiver Method for sponge bathing under operated UE: Minimal assistance;Caregiver independent with task;Patient able to independently direct caregiver Donning/doffing sling/immobilizer: Moderate assistance;Caregiver independent with task;Patient able to independently direct caregiver Correct positioning of sling/immobilizer: Minimal assistance;Caregiver independent with task;Patient able to independently direct caregiver Pendulum exercises (written home exercise program): Caregiver independent with task;Patient able to independently direct caregiver ROM for elbow, wrist and digits of operated UE: Caregiver independent with task;Patient able to independently direct caregiver Sling wearing schedule (on at all times/off for ADL's): Caregiver independent with task;Patient able to independently direct caregiver Proper positioning of operated UE when showering: Patient able to independently direct caregiver;Caregiver independent with task Dressing change:  (N/A) Positioning of UE while sleeping: Patient able to independently direct caregiver;Caregiver independent with task    Home Living Family/patient expects to be discharged to:: Private residence Living Arrangements: Spouse/significant other Available Help at Discharge: Family Type of  Home: House Home Access: Level entry     Home Layout: Able to live on main level with bedroom/bathroom     Bathroom Shower/Tub: Occupational psychologist: Handicapped height     Home Equipment: Clinical cytogeneticist - 2 wheels          Prior Functioning/Environment Level of Independence: Independent                 OT Problem List: Pain;Impaired UE functional use;Decreased knowledge of precautions         OT Goals(Current goals can be found in the care plan section) Acute Rehab OT Goals Patient Stated Goal: home OT Goal Formulation: All assessment and education complete, DC therapy   AM-PAC OT "6 Clicks" Daily Activity     Outcome Measure Help from another person eating meals?: None Help from another person taking care of personal grooming?: None Help from another person toileting, which includes using toliet, bedpan, or urinal?: None Help from another person bathing (including washing, rinsing, drying)?: A Little Help from another person to put on and taking off regular upper body clothing?: A Little Help from another person to put on and taking off regular lower body clothing?: None 6 Click Score: 22   End of Session Equipment Utilized During Treatment: Other (comment) (sling) Nurse Communication: Other (comment) (OT complete)  Activity Tolerance: Patient tolerated treatment well Patient left: in chair;with call bell/phone within reach;with family/visitor present  OT Visit Diagnosis: Pain Pain - Right/Left: Left Pain - part of body: Shoulder                Time: 3532-9924 OT Time Calculation (min): 31  min Charges:  OT General Charges $OT Visit: 1 Visit OT Evaluation $OT Eval Low Complexity: 1 Low OT Treatments $Self Care/Home Management : 8-22 mins  Delbert Phenix OT OT pager: [phone]   Rosemary Holms 01/17/2021, 1:24 PM

## 2021-01-17 NOTE — H&P (Signed)
Sherry [name]    Chief Complaint: Left shoulder osteoarthritis HPI: The patient is a 75 y.o. female with chronic and progressively increasing left shoulder pain related to severe osteoarthritis.  Due to her increasing functional rotations and failure to respond to prolonged attempts at conservative management, the patient is brought to the operating room this time for planned left shoulder anatomic arthroplasty.  Past Medical History:  Diagnosis Date   Anxiety    Cancer ([place])    Carpal tunnel syndrome on both sides    Chronic back pain    herniated disc,stenosis,radiculopathy,spondylolisthesis   Chronic neck pain    Constipation, chronic    Degenerative spinal arthritis    cervical and lumbar   Depression    Eczema    GERD (gastroesophageal reflux disease)    Hemorrhoids    Hiatal hernia    History of bronchitis    "I've had it a number of times"   History of COVID-19 04/22/2019   IBS (irritable bowel syndrome)    lactose intolerant   Osteoarthritis    bilateral knees and shoulders,  little fingers   TMJ (dislocation of temporomandibular joint)    Wears glasses     Past Surgical History:  Procedure Laterality Date   APPENDECTOMY  1967   BACK SURGERY     BLADDER SUSPENSION  1980's   sling   BREAST BIOPSY Left 1980's   "twice on the left"   BREAST LUMPECTOMY     x 2   BREAST REDUCTION SURGERY Bilateral 1988   CARPAL TUNNEL RELEASE Bilateral 2020   CERVIX LESION DESTRUCTION  age 68s   DEBRIDEMENT COMPLEX MUCOID CYST, ARTHROTOMY W/ SYNOVECTOMY Left 07-21-2008  dr sypher [name]   Left long finger   [place] / [place] / KNEE / THIGH  1994   "day after knee scope"   JOINT REPLACEMENT     bilateral knees    KNEE ARTHROSCOPY  1994   unilateral   POSTERIOR FUSION LUMBAR SPINE  02/2011   L3-L5   REDUCTION MAMMAPLASTY Bilateral    TONSILLECTOMY  1951   TOTAL KNEE ARTHROPLASTY Bilateral 05/27/2017    Procedure: TOTAL KNEE BILATERAL;  Surgeon: Gaynelle Arabian, MD;  Location: [facility];  Service: Orthopedics;  Laterality: Bilateral;   TOTAL SHOULDER ARTHROPLASTY Right 03/01/2020   Procedure: Right shoulder anatomic arthroplasty;  Surgeon: Justice Britain, MD;  Location: [facility];  Service: Orthopedics;  Laterality: Right;  166min    Family History  Problem Relation Age of Onset   Kidney disease Father    Rheum arthritis Father    Parkinson's disease Mother    Leukemia Sister    Colon cancer Maternal Grandfather        questionable   Breast cancer Paternal Aunt    Anesthesia problems Neg Hx    Hypotension Neg Hx    Malignant hyperthermia Neg Hx    Pseudochol deficiency Neg Hx     Social History:  reports that she quit smoking about 52 years ago. Her smoking use included cigarettes. She has never used smokeless tobacco. She reports current alcohol use of about 5.0 - 6.0 standard drinks per week. She reports that she does not use drugs.   Medications Prior to Admission  Medication Sig Dispense Refill   acetaminophen (TYLENOL) 500 MG tablet Take 500 mg by mouth every 8 (eight) hours as needed for moderate pain.     celecoxib (CELEBREX) 200 MG  capsule Take 1 capsule (200 mg total) by mouth daily as needed. 30 capsule 1   cyclobenzaprine (FLEXERIL) 10 MG tablet Take 1 tablet (10 mg total) by mouth 3 (three) times daily as needed for muscle spasms. (Patient taking differently: Take 10 mg by mouth at bedtime as needed for muscle spasms.) 30 tablet 1   diclofenac Sodium (VOLTAREN) 1 % GEL Apply 1 application topically daily as needed (pain).     diphenhydramine-acetaminophen (TYLENOL PM) 25-500 MG TABS tablet Take 0.5 tablets by mouth at bedtime as needed (pain).     famotidine (PEPCID) 20 MG tablet Take 20 mg by mouth as needed for heartburn or indigestion.     gabapentin (NEURONTIN) 300 MG capsule Take 300 mg by mouth at bedtime.      ketoconazole (NIZORAL) 2 % cream Apply 1 application topically  daily as needed (scaly skin).     ketoconazole (NIZORAL) 2 % shampoo Apply 1 application topically daily as needed (itchy scalp).      naproxen sodium (ALEVE) 220 MG tablet Take 220 mg by mouth daily as needed (pain).     polyethylene glycol (MIRALAX) packet Take 17 g by mouth 3 (three) times daily. Until bowels move; maximum 2 consecutive days (Patient taking differently: Take 8.5-17 g by mouth daily as needed for moderate constipation.) 6 each 0   Propylene Glycol (SYSTANE BALANCE) 0.6 % SOLN Place 1 drop into both eyes daily as needed (dry eyes).     traMADol (ULTRAM) 50 MG tablet Take 50 mg by mouth at bedtime as needed for moderate pain.       Physical Exam: Left shoulder demonstrates painful and guarded motion is noted at her recent office visits.  She maintains good strength to manual muscle testing.  Otherwise remains neurovascular intact in left upper extremity.  Plain radiographs confirm severe osteoarthritis with complete obliteration of the joint space, subchondral sclerosis, and peripheral osteophyte formation.  Preoperative CT scan was obtained for surgical planning and implant positioning optimization  Vitals  Temp:  [97.7 F (36.5 C)] 97.7 F (36.5 C) (10/06 0609) Pulse Rate:  [84] 84 (10/06 0609) Resp:  [16] 16 (10/06 0609) BP: (144)/(86) 144/86 (10/06 0609) SpO2:  [98 %] 98 % (10/06 0609)  Assessment/Plan  Impression: Left shoulder osteoarthritis  Plan of Action: Procedure(s): TOTAL SHOULDER ARTHROPLASTY  Shakila Mak M Jawanna Dykman 01/17/2021, 6:57 AM Contact # [phone]

## 2021-01-17 NOTE — Anesthesia Preprocedure Evaluation (Signed)
Anesthesia Evaluation  Patient identified by MRN, date of birth, ID band Patient awake    Reviewed: Allergy & Precautions, NPO status , Patient's Chart, lab work & pertinent test results  Airway Mallampati: I       Dental no notable dental hx.    Pulmonary former smoker,    Pulmonary exam normal        Cardiovascular Normal cardiovascular exam     Neuro/Psych PSYCHIATRIC DISORDERS Anxiety Depression    GI/Hepatic Neg liver ROS, GERD  Medicated,  Endo/Other  negative endocrine ROS  Renal/GU negative Renal ROS     Musculoskeletal  (+) Arthritis , Osteoarthritis,    Abdominal Normal abdominal exam  (+)   Peds  Hematology   Anesthesia Other Findings   Reproductive/Obstetrics                             Anesthesia Physical  Anesthesia Plan  ASA: II  Anesthesia Plan: General   Post-op Pain Management:  Regional for Post-op pain   Induction: Intravenous  PONV Risk Score and Plan: 3  Airway Management Planned: Oral ETT  Additional Equipment: None  Intra-op Plan:   Post-operative Plan: Extubation in OR  Informed Consent: I have reviewed the patients History and Physical, chart, labs and discussed the procedure including the risks, benefits and alternatives for the proposed anesthesia with the patient or authorized representative who has indicated his/her understanding and acceptance.     Dental advisory given  Plan Discussed with: CRNA  Anesthesia Plan Comments:         Anesthesia Quick Evaluation

## 2021-01-17 NOTE — Anesthesia Procedure Notes (Signed)
Procedure Name: Intubation Date/Time: 01/17/2021 7:45 AM Performed by: Claudia Desanctis, CRNA Pre-anesthesia Checklist: Patient identified, Emergency Drugs available, Suction available and Patient being monitored Patient Re-evaluated:Patient Re-evaluated prior to induction Oxygen Delivery Method: Circle system utilized Preoxygenation: Pre-oxygenation with 100% oxygen Induction Type: IV induction Ventilation: Mask ventilation without difficulty Laryngoscope Size: 2 and Miller Grade View: Grade I Tube type: Oral Tube size: 7.0 mm Number of attempts: 1 Airway Equipment and Method: Stylet Placement Confirmation: ETT inserted through vocal cords under direct vision, positive ETCO2 and breath sounds checked- equal and bilateral Secured at: 21 cm Tube secured with: Tape Dental Injury: Teeth and Oropharynx as per pre-operative assessment  Difficulty Due To: Difficult Airway- due to anterior larynx Comments: Laryngeal pressure needed for view

## 2021-01-17 NOTE — Op Note (Signed)
01/17/2021  9:36 AM  PATIENT:   Sherry [name]  75 y.o. female  PRE-OPERATIVE DIAGNOSIS:  Left shoulder osteoarthritis  POST-OPERATIVE DIAGNOSIS: Same  PROCEDURE: Left shoulder anatomic stemless arthroplasty utilizing a size 41 trunnion, medium cage screw, 41 x 18 humeral head, medium glenoid  SURGEON:  Ciji Boston, Metta Clines M.D.  ASSISTANTS: Jenetta Loges, PA-C  ANESTHESIA:   General endotracheal and interscalene block with Exparel  EBL: 100 cc  SPECIMEN: None  Drains: None   PATIENT DISPOSITION:  PACU - hemodynamically stable.    PLAN OF CARE: Discharge to home after PACU  Brief history:  Patient is a 75 year old female well-known to our practice after previous right shoulder anatomic arthroplasty who has been followed for chronic and progressively increasing left shoulder pain related to severe osteoarthritis.  Due to her increasing functional imitations and failure to respond to prolonged attempts at conservative management, she is brought to the operating this time for planned left shoulder anatomic arthroplasty.  Preoperatively, I counseled the patient regarding treatment options and risks versus benefits thereof.  Possible surgical complications were all reviewed including potential for bleeding, infection, neurovascular injury, persistent pain, loss of motion, anesthetic complication, failure of the implant, and possible need for additional surgery. They understand and accept and agrees with our planned procedure.   Procedure in detail:  After undergoing routine preop evaluation the patient received prophylactic antibiotics and interscalene block with Exparel was established in the holding area by the anesthesia department.  Patient was subsequently placed supine on the operating table and underwent the smooth induction of a general endotracheal anesthesia.  Placed into the beachchair position and appropriately padded and protected.  The left shoulder girdle region was  sterilely prepped and draped in standard fashion.  Timeout was called.  A deltopectoral approach to the left shoulder is made to an approximately 7 cm incision.  Skin flaps were elevated dissection carried deeply and the deltopectoral interval was then developed from proximal to distal with the vein taken laterally.  The conjoined tendon was mobilized and retracted medially and the upper centimeter the pectoralis major tendon was tenotomized for exposure.  The long head biceps tendon was then tenodesed at the upper border the pectoralis major tendon with the proximal segment unroofed and excised.  The rotator cuff was then split from the apex of the bicipital groove to the base of the coracoid and the insertion of the subscapularis was then carefully identified at its superior and inferior insertions into the lesser tuberosity.  An oscillating saw was then used to perform a lesser tuberosity osteotomy removing a thin wafer of bone.  The subscap was then tagged and capsular attachments were then divided from the anterior and inferior margins of the humeral neck allowing delivery of the humeral head through the wound.  An extra medullary guide was then used to outline a proposed humeral head resection which we performed with an oscillating saw taking care to protect the surrounding soft tissues.  Rondure was then used to remove the large osteophytes on the anterior and infra margins of the humeral neck.  The metaphysis was then prepared for a size 41 trunnion which showed the best fit.  Our cage screw was measured at a medium and metaphyseal preparation was then completed and a metal cap was then placed over the cut proximal humeral surface.  At this point we exposed the glenoid with appropriate retractors and performed a circumferential labral resection gaining complete visualization of the periphery of the glenoid.  We  then utilized the glenoid guide which had been set up based on the preoperative CT scan planning  software.  A guidepin was then directed into the center of the glenoid and the glenoid was then reamed to a stable subchondral bony bed.  Preparation completed with the central drill hole followed by the placement of our superior and inferior peg and slot respectively and the glenoid was then broached and a trial showed excellent fit and fixation.  This point the glenoid was cleaned and dried cement was mixed and introduced into the superior and inferior peg and slot respectively.  Our glenoid was then impacted after morselized bone was placed around the central peg and excellent fixation was achieved.  We then returned our attention to the metaphysis and confirmed that the bone quality was appropriate for the use of a stemless implant.  We selected the size 41 trunnion we did place 2 suture tape sutures through the eyelets on the collar of the trunnion and the trunnion was then seated in a medium cage screw was packed with bone graft harvested from the humeral head and the cage screw was then seated with excellent purchase and fixation.  We then performed trial reductions and felt ultimately that the 41 x 18 head gave [place] the best motion stability and soft tissue balance with approximately 50% translation of the humeral head over the glenoid.  The trial was then removed.  We placed an additional medial row suture anchor such that we would have 3 suture limbs available for the repair of the LTO.  At this time the implant was cleaned and dried and the final 41 x 18 head was impacted and final reduction was then performed again showing good motion stability and soft tissue balance much to our satisfaction.  We then confirmed good mobility and elasticity of the subscapularis and we then repaired the LTO passing our medial row suture limbs up through the bone tendon junction and they these were then passed in an alternating fashion into 2 lateral anchors allowing a double row repair with excellent compression across the  LTO with stability much to our satisfaction.  The arm easily achieved 45 degrees of external [place] without excessive tension on the subscap repair.  We then placed a series of figure-of-eight suture tape sutures along the rotator interval to complete the repair.  This point final irrigation was completed.  Hemostasis was obtained.  Vancomycin powder was then spread liberally throughout the deep soft tissue layers.  The deltopectoral interval was reapproximated with a series of figure-of-eight #1 Vicryl sutures.  2-0 Monocryl used to the subcu layer and intracuticular 3-0 Monocryl for the skin followed by Dermabond and Aquacel dressing.  The left arm was then placed into a sling.  The patient was awakened, extubated, and taken to the recovery room in stable condition.  Jenetta Loges, PA-C was utilized as an Environmental consultant throughout this case, essential for help with positioning the patient, positioning extremity, tissue manipulation, implantation of the prosthesis, suture management, wound closure, and intraoperative decision-making.  Marin Shutter MD   Contact # [phone]

## 2021-01-17 NOTE — Progress Notes (Signed)
Assisted Dr. Ambrose Pancoast with left, ultrasound guided, axillary brachial plexus block. See his note Side rails up, monitors on throughout procedure. See vital signs in flow sheet. Tolerated Procedure well.

## 2021-01-17 NOTE — Transfer of Care (Signed)
Immediate Anesthesia Transfer of Care Note  Patient: Sherry [name]  Procedure(s) Performed: TOTAL SHOULDER ARTHROPLASTY (Left: Shoulder)  Patient Location: PACU  Anesthesia Type:GA combined with regional for post-op pain  Level of Consciousness: awake and patient cooperative  Airway & Oxygen Therapy: Patient Spontanous Breathing and Patient connected to face mask  Post-op Assessment: Report given to RN and Post -op Vital signs reviewed and stable  Post vital signs: Reviewed and stable  Last Vitals:  Vitals Value Taken Time  BP 154/86 01/17/21 0939  Temp    Pulse 89 01/17/21 0939  Resp    SpO2 98 % 01/17/21 0939  Vitals shown include unvalidated device data.  Last Pain:  Vitals:   01/17/21 0609  TempSrc: Oral  PainSc:          Complications: No notable events documented.

## 2021-01-17 NOTE — Anesthesia Procedure Notes (Signed)
Anesthesia Regional Block: Interscalene brachial plexus block   Pre-Anesthetic Checklist: , timeout performed,  Correct Patient, Correct Site, Correct Laterality,  Correct Procedure, Correct Position, site marked,  Risks and benefits discussed,  Surgical consent,  Pre-op evaluation,  At surgeon's request and post-op pain management  Laterality: Left  Prep: chloraprep       Needles:  Injection technique: Single-shot  Needle Type: Stimiplex     Needle Length: 9cm  Needle Gauge: 21     Additional Needles:   Narrative:  Start time: 01/17/2021 7:17 AM End time: 01/17/2021 7:22 AM Injection made incrementally with aspirations every 5 mL.  Performed by: Personally  Anesthesiologist: Lynda Rainwater, MD

## 2021-01-17 NOTE — Discharge Instructions (Signed)
 Kevin M. Supple, M.D., F.A.A.O.S. Orthopaedic Surgery Specializing in Arthroscopic and Reconstructive Surgery of the Shoulder [phone] [address] - [place], [place] [address] - Fax [phone]   POST-OP TOTAL SHOULDER REPLACEMENT INSTRUCTIONS  1. Follow up in the office for your first post-op appointment 10-14 days from the date of your surgery. If you do not already have a scheduled appointment, our office will contact you to schedule.  2. The bandage over your incision is waterproof. You may begin showering with this dressing on. You may leave this dressing on until first follow up appointment within 2 weeks. We prefer you leave this dressing in place until follow up however after 5-7 days if you are having itching or skin irritation and would like to remove it you may do so. Go slow and tug at the borders gently to break the bond the dressing has with the skin. At this point if there is no drainage it is okay to go without a bandage or you may cover it with a light guaze and tape. You can also expect significant bruising around your shoulder that will drift down your arm and into your chest wall. This is very normal and should resolve over several days.   3. Wear your sling/immobilizer at all times except to perform the exercises below or to occasionally let your arm dangle by your side to stretch your elbow. You also need to sleep in your sling immobilizer until instructed otherwise. It is ok to remove your sling if you are sitting in a controlled environment and allow your arm to rest in a position of comfort by your side or on your lap with pillows to give your neck and skin a break from the sling. You may remove it to allow arm to dangle by side to shower. If you are up walking around and when you go to sleep at night you need to wear it.  4. Range of motion to your elbow, wrist, and hand are encouraged 3-5 times daily. Exercise to your hand and fingers helps to reduce  swelling you may experience.   5. Prescriptions for a pain medication and a muscle relaxant are provided for you. It is recommended that if you are experiencing pain that you pain medication alone is not controlling, add the muscle relaxant along with the pain medication which can give additional pain relief. The first 1-2 days is generally the most severe of your pain and then should gradually decrease. As your pain lessens it is recommended that you decrease your use of the pain medications to an "as needed basis'" only and to always comply with the recommended dosages of the pain medications.  6. Pain medications can produce constipation along with their use. If you experience this, the use of an over the counter stool softener or laxative daily is recommended.   7. For additional questions or concerns, please do not hesitate to call the office. If after hours there is an answering service to forward your concerns to the physician on call.  8.Pain control following an exparel block  To help control your post-operative pain you received a nerve block  performed with Exparel which is a long acting anesthetic (numbing agent) which can provide pain relief and sensations of numbness (and relief of pain) in the operative shoulder and arm for up to 3 days. Sometimes it provides mixed relief, meaning you may still have numbness in certain areas of the arm but can still be able to   move  parts of that arm, hand, and fingers. We recommend that your prescribed pain medications  be used as needed. We do not feel it is necessary to "pre medicate" and "stay ahead" of pain.  Taking narcotic pain medications when you are not having any pain can lead to unnecessary and potentially dangerous side effects.    9. Use the ice machine as much as possible in the first 5-7 days from surgery, then you can wean its use to as needed. The ice typically needs to be replaced every 6 hours, instead of ice you can actually freeze  water bottles to put in the cooler and then fill water around them to avoid having to purchase ice. You can have spare water bottles freezing to allow you to rotate them once they have melted. Try to have a thin shirt or light cloth or towel under the ice wrap to protect your skin.   FOR ADDITIONAL INFO ON ICE MACHINE AND INSTRUCTIONS GO TO THE WEBSITE AT  https://[email or web address].com/products/donjoy/[email or web address]-iceman-[email or web address]  10.  We recommend that you avoid any dental work or cleaning in the first 3 months following your joint replacement. This is to help minimize the possibility of infection from the bacteria in your mouth that enters your bloodstream during dental work. We also recommend that you take an antibiotic prior to your dental work for the first year after your shoulder replacement to further help reduce that risk. Please simply contact our office for antibiotics to be sent to your pharmacy prior to dental work.  11. Dental Antibiotics:  In most cases prophylactic antibiotics for Dental procdeures after total joint surgery are not necessary.  Exceptions are as follows:  1. History of prior total joint infection  2. Severely immunocompromised (Organ Transplant, cancer chemotherapy, Rheumatoid biologic meds such as Humera)  3. Poorly controlled diabetes (A1C &gt; 8.0, blood glucose over 200)  If you have one of these conditions, contact your surgeon for an antibiotic prescription, prior to your dental procedure.   POST-OP EXERCISES  Pendulum Exercises  Perform pendulum exercises while standing and bending at the waist. Support your uninvolved arm on a table or chair and allow your operated arm to hang freely. Make sure to do these exercises passively - not using you shoulder muscles. These exercises can be performed once your nerve block effects have worn off.  Repeat 20 times. Do 3 sessions per day.     

## 2021-01-17 NOTE — Anesthesia Procedure Notes (Signed)
Anesthesia Regional Block: Axillary brachial plexus block (musculocutaneous)   Pre-Anesthetic Checklist: , timeout performed,  Correct Patient, Correct Site, Correct Laterality,  Correct Procedure, Correct Position, site marked,  Risks and benefits discussed,  Surgical consent,  Pre-op evaluation,  At surgeon's request and post-op pain management  Laterality: Left  Prep: chloraprep       Needles:  Injection technique: Single-shot  Needle Type: Echogenic Stimulator Needle     Needle Length: 5cm  Needle Gauge: 22     Additional Needles:   Procedures:, nerve stimulator,,, ultrasound used (permanent image in chart),,    Narrative:  Start time: 01/17/2021 11:45 AM End time: 01/17/2021 11:52 AM Injection made incrementally with aspirations every 5 mL.  Performed by: Personally  Anesthesiologist: Janeece Riggers, MD  Additional Notes: Functioning IV was confirmed and monitors were applied.  A 66mm 22ga Arrow echogenic stimulator needle was used. Sterile prep and drape,hand hygiene and sterile gloves were used. Ultrasound guidance: relevant anatomy identified, needle position confirmed, local anesthetic spread visualized around nerve(s)., vascular puncture avoided.  Image printed for medical record. Negative aspiration and negative test dose prior to incremental administration of local anesthetic. The patient tolerated the procedure well.   NO PIK

## 2021-01-18 NOTE — Anesthesia Postprocedure Evaluation (Signed)
Anesthesia Post Note  Patient: Sherry [name]  Procedure(s) Performed: TOTAL SHOULDER ARTHROPLASTY (Left: Shoulder)     Patient location during evaluation: PACU Anesthesia Type: General Level of consciousness: awake and alert Pain management: pain level controlled Vital Signs Assessment: post-procedure vital signs reviewed and stable Respiratory status: spontaneous breathing, nonlabored ventilation and respiratory function stable Cardiovascular status: blood pressure returned to baseline and stable Postop Assessment: no apparent nausea or vomiting Anesthetic complications: no   No notable events documented.  Last Vitals:  Vitals:   01/17/21 1215 01/17/21 1245  BP: (!) 143/81 (!) 150/86  Pulse: 84 86  Resp: (!) 23 15  Temp:    SpO2: 94% 95%    Last Pain:  Vitals:   01/17/21 1300  TempSrc:   PainSc: 1    Pain Goal:                   Lynda Rainwater

## 2021-01-21 ENCOUNTER — Encounter (HOSPITAL_COMMUNITY): Payer: Self-pay | Admitting: Orthopedic Surgery

## 2021-01-28 DIAGNOSIS — M19012 Primary osteoarthritis, left shoulder: Secondary | ICD-10-CM | POA: Diagnosis not present

## 2021-01-29 ENCOUNTER — Ambulatory Visit: Payer: Medicare Other

## 2021-01-30 DIAGNOSIS — K219 Gastro-esophageal reflux disease without esophagitis: Secondary | ICD-10-CM | POA: Diagnosis not present

## 2021-01-30 DIAGNOSIS — R109 Unspecified abdominal pain: Secondary | ICD-10-CM | POA: Diagnosis not present

## 2021-02-14 DIAGNOSIS — M25512 Pain in left shoulder: Secondary | ICD-10-CM | POA: Diagnosis not present

## 2021-02-15 ENCOUNTER — Other Ambulatory Visit (HOSPITAL_COMMUNITY): Payer: Medicare Other

## 2021-02-19 ENCOUNTER — Ambulatory Visit: Payer: Medicare Other

## 2021-02-19 DIAGNOSIS — M25512 Pain in left shoulder: Secondary | ICD-10-CM | POA: Diagnosis not present

## 2021-02-22 ENCOUNTER — Other Ambulatory Visit (HOSPITAL_COMMUNITY): Payer: Medicare Other

## 2021-02-22 DIAGNOSIS — M25512 Pain in left shoulder: Secondary | ICD-10-CM | POA: Diagnosis not present

## 2021-02-25 DIAGNOSIS — Z96612 Presence of left artificial shoulder joint: Secondary | ICD-10-CM | POA: Diagnosis not present

## 2021-02-25 DIAGNOSIS — M25512 Pain in left shoulder: Secondary | ICD-10-CM | POA: Diagnosis not present

## 2021-02-26 DIAGNOSIS — K645 Perianal venous thrombosis: Secondary | ICD-10-CM | POA: Diagnosis not present

## 2021-02-26 DIAGNOSIS — K625 Hemorrhage of anus and rectum: Secondary | ICD-10-CM | POA: Diagnosis not present

## 2021-02-28 DIAGNOSIS — M25512 Pain in left shoulder: Secondary | ICD-10-CM | POA: Diagnosis not present

## 2021-03-04 DIAGNOSIS — M25512 Pain in left shoulder: Secondary | ICD-10-CM | POA: Diagnosis not present

## 2021-03-06 DIAGNOSIS — M25512 Pain in left shoulder: Secondary | ICD-10-CM | POA: Diagnosis not present

## 2021-03-12 DIAGNOSIS — M25512 Pain in left shoulder: Secondary | ICD-10-CM | POA: Diagnosis not present

## 2021-03-14 DIAGNOSIS — L821 Other seborrheic keratosis: Secondary | ICD-10-CM | POA: Diagnosis not present

## 2021-03-14 DIAGNOSIS — M25512 Pain in left shoulder: Secondary | ICD-10-CM | POA: Diagnosis not present

## 2021-03-14 DIAGNOSIS — L570 Actinic keratosis: Secondary | ICD-10-CM | POA: Diagnosis not present

## 2021-03-14 DIAGNOSIS — L814 Other melanin hyperpigmentation: Secondary | ICD-10-CM | POA: Diagnosis not present

## 2021-03-14 DIAGNOSIS — D225 Melanocytic nevi of trunk: Secondary | ICD-10-CM | POA: Diagnosis not present

## 2021-03-25 DIAGNOSIS — M25512 Pain in left shoulder: Secondary | ICD-10-CM | POA: Diagnosis not present

## 2021-03-28 DIAGNOSIS — M25512 Pain in left shoulder: Secondary | ICD-10-CM | POA: Diagnosis not present

## 2021-04-01 ENCOUNTER — Ambulatory Visit
Admission: RE | Admit: 2021-04-01 | Discharge: 2021-04-01 | Disposition: A | Discharge status: Home / Self Care | Payer: Medicare Other | Source: Ambulatory Visit | Attending: Internal Medicine | Admitting: Internal Medicine

## 2021-04-01 DIAGNOSIS — M25512 Pain in left shoulder: Secondary | ICD-10-CM | POA: Diagnosis not present

## 2021-04-01 DIAGNOSIS — Z1231 Encounter for screening mammogram for malignant neoplasm of breast: Secondary | ICD-10-CM | POA: Diagnosis not present

## 2021-04-04 DIAGNOSIS — M25512 Pain in left shoulder: Secondary | ICD-10-CM | POA: Diagnosis not present

## 2021-04-09 DIAGNOSIS — M25512 Pain in left shoulder: Secondary | ICD-10-CM | POA: Diagnosis not present

## 2021-04-09 DIAGNOSIS — Z4789 Encounter for other orthopedic aftercare: Secondary | ICD-10-CM | POA: Diagnosis not present

## 2021-04-12 DIAGNOSIS — M25512 Pain in left shoulder: Secondary | ICD-10-CM | POA: Diagnosis not present

## 2021-04-18 DIAGNOSIS — M25512 Pain in left shoulder: Secondary | ICD-10-CM | POA: Diagnosis not present

## 2021-04-18 DIAGNOSIS — M1812 Unilateral primary osteoarthritis of first carpometacarpal joint, left hand: Secondary | ICD-10-CM | POA: Diagnosis not present

## 2021-04-22 DIAGNOSIS — M25512 Pain in left shoulder: Secondary | ICD-10-CM | POA: Diagnosis not present

## 2021-04-26 DIAGNOSIS — M25512 Pain in left shoulder: Secondary | ICD-10-CM | POA: Diagnosis not present

## 2021-04-30 DIAGNOSIS — M25512 Pain in left shoulder: Secondary | ICD-10-CM | POA: Diagnosis not present

## 2021-05-03 DIAGNOSIS — M25512 Pain in left shoulder: Secondary | ICD-10-CM | POA: Diagnosis not present

## 2021-05-06 DIAGNOSIS — M25512 Pain in left shoulder: Secondary | ICD-10-CM | POA: Diagnosis not present

## 2021-05-09 DIAGNOSIS — M25512 Pain in left shoulder: Secondary | ICD-10-CM | POA: Diagnosis not present

## 2021-05-15 DIAGNOSIS — R52 Pain, unspecified: Secondary | ICD-10-CM | POA: Diagnosis not present

## 2021-05-15 DIAGNOSIS — M1812 Unilateral primary osteoarthritis of first carpometacarpal joint, left hand: Secondary | ICD-10-CM | POA: Diagnosis not present

## 2021-06-03 ENCOUNTER — Other Ambulatory Visit: Payer: Self-pay

## 2021-06-03 ENCOUNTER — Ambulatory Visit: Payer: Medicare Other | Admitting: Podiatry

## 2021-06-03 DIAGNOSIS — B351 Tinea unguium: Secondary | ICD-10-CM

## 2021-06-03 MED ORDER — TERBINAFINE HCL 250 MG PO TABS: 250.0000 mg | ORAL_TABLET | Freq: Every day | ORAL | 0 refills | Status: AC

## 2021-06-03 NOTE — Progress Notes (Signed)
Subjective:   Patient ID: Sherry [name], female   DOB: 76 y.o.   MRN: [id number]   HPI 2 patient states over the last year she has developed significant yellow discoloration of the hallux nail left distal two thirds with very slight looseness but more the significant discoloration with history of mycotic nail infection which did well in the past.  Patient does not smoke likes to be active   Review of Systems  All other systems reviewed and are negative.      Objective:  Physical Exam Vitals and nursing note reviewed.  Constitutional:      Appearance: She is well-developed.  Pulmonary:     Effort: Pulmonary effort is normal.  Musculoskeletal:        General: Normal range of motion.  Skin:    General: Skin is warm.  Neurological:     Mental Status: She is alert.    Neurovascular status intact muscle strength found to be adequate range of motion within normal limits with patient found to have a deformed left hallux nail distal two thirds with redness and thickness of the nailbed.  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  Patient will have this done once a month and is scheduled for these procedures with education given today

## 2021-06-10 ENCOUNTER — Telehealth: Payer: Self-pay | Admitting: *Deleted

## 2021-06-10 NOTE — Telephone Encounter (Signed)
Should stop taking

## 2021-06-10 NOTE — Telephone Encounter (Signed)
Patient called again about the medication she was put on, she is having side effects from the medication and wants to know should she continue taking it or can it be changed to something else ?

## 2021-06-10 NOTE — Telephone Encounter (Signed)
Patient is calling because the Terbinafine-250 mg is causing negative side effects(extreme exhaustions), loose stools, upset stomach. Please advise.

## 2021-06-11 NOTE — Telephone Encounter (Signed)
Spoke with patient this morning about stopping the medication . She said she was not taking the 1/2 dose that Dr Paulla Dolly had prescribed that she was taking a full dose. She took a 1/2 dose last night since it makes her tired and she will keep [place] posted on how she feels later this morning. Would like to speak with a nurse or Dr Paulla Dolly.

## 2021-06-13 NOTE — Telephone Encounter (Signed)
Patient took half dose  of medication for 2 days and her stomach only acted up a little , but she stopped taking that until she talk to nurse or Dr Sherry [name] .  Can she continue taking as a half dose instead of the full 250?  Or is there another alternative?

## 2021-06-13 NOTE — Telephone Encounter (Signed)
Not another good alternative. Fine if she can tolerate the 1/2 dose

## 2021-06-13 NOTE — Telephone Encounter (Signed)
Called patient back and let her know it was ok to continue on the 1/2 dose if she can tolerate it.

## 2021-07-05 ENCOUNTER — Other Ambulatory Visit: Payer: Self-pay

## 2021-07-05 ENCOUNTER — Ambulatory Visit (INDEPENDENT_AMBULATORY_CARE_PROVIDER_SITE_OTHER): Payer: Medicare Other

## 2021-07-05 DIAGNOSIS — B351 Tinea unguium: Secondary | ICD-10-CM

## 2021-07-05 NOTE — Patient Instructions (Signed)

## 2021-07-05 NOTE — Progress Notes (Signed)
Patient presents today for the 1st laser treatment. Diagnosed with mycotic nail infection by Dr. Paulla Dolly.  ? ?Toenail most affected left hallux. ? ?All other systems are negative. ? ?Nails were filed thin. Laser therapy was administered to left great  toenail and patient tolerated the treatment well. All safety precautions were in place.  ? ? ?Follow up in 4 weeks for laser # 2. ? ?Picture of nails taken today to document visual progress  ?

## 2021-07-08 DIAGNOSIS — J45909 Unspecified asthma, uncomplicated: Secondary | ICD-10-CM | POA: Diagnosis not present

## 2021-07-08 DIAGNOSIS — G4700 Insomnia, unspecified: Secondary | ICD-10-CM | POA: Diagnosis not present

## 2021-07-08 DIAGNOSIS — H9202 Otalgia, left ear: Secondary | ICD-10-CM | POA: Diagnosis not present

## 2021-07-08 DIAGNOSIS — R197 Diarrhea, unspecified: Secondary | ICD-10-CM | POA: Diagnosis not present

## 2021-07-08 DIAGNOSIS — R519 Headache, unspecified: Secondary | ICD-10-CM | POA: Diagnosis not present

## 2021-07-08 DIAGNOSIS — E559 Vitamin D deficiency, unspecified: Secondary | ICD-10-CM | POA: Diagnosis not present

## 2021-07-08 DIAGNOSIS — Z0000 Encounter for general adult medical examination without abnormal findings: Secondary | ICD-10-CM | POA: Diagnosis not present

## 2021-07-08 DIAGNOSIS — K5900 Constipation, unspecified: Secondary | ICD-10-CM | POA: Diagnosis not present

## 2021-07-08 DIAGNOSIS — K589 Irritable bowel syndrome without diarrhea: Secondary | ICD-10-CM | POA: Diagnosis not present

## 2021-07-08 DIAGNOSIS — G5603 Carpal tunnel syndrome, bilateral upper limbs: Secondary | ICD-10-CM | POA: Diagnosis not present

## 2021-07-08 DIAGNOSIS — K648 Other hemorrhoids: Secondary | ICD-10-CM | POA: Diagnosis not present

## 2021-07-08 DIAGNOSIS — Z7189 Other specified counseling: Secondary | ICD-10-CM | POA: Diagnosis not present

## 2021-07-08 DIAGNOSIS — K219 Gastro-esophageal reflux disease without esophagitis: Secondary | ICD-10-CM | POA: Diagnosis not present

## 2021-07-08 DIAGNOSIS — Z1389 Encounter for screening for other disorder: Secondary | ICD-10-CM | POA: Diagnosis not present

## 2021-07-26 DIAGNOSIS — R197 Diarrhea, unspecified: Secondary | ICD-10-CM | POA: Diagnosis not present

## 2021-07-31 DIAGNOSIS — Z96612 Presence of left artificial shoulder joint: Secondary | ICD-10-CM | POA: Diagnosis not present

## 2021-08-01 DIAGNOSIS — H31001 Unspecified chorioretinal scars, right eye: Secondary | ICD-10-CM | POA: Diagnosis not present

## 2021-08-01 DIAGNOSIS — Z961 Presence of intraocular lens: Secondary | ICD-10-CM | POA: Diagnosis not present

## 2021-08-01 DIAGNOSIS — H524 Presbyopia: Secondary | ICD-10-CM | POA: Diagnosis not present

## 2021-08-02 ENCOUNTER — Ambulatory Visit (INDEPENDENT_AMBULATORY_CARE_PROVIDER_SITE_OTHER): Payer: Self-pay

## 2021-08-02 DIAGNOSIS — B351 Tinea unguium: Secondary | ICD-10-CM

## 2021-08-02 NOTE — Progress Notes (Signed)
Patient presents today for the 1st laser treatment. Diagnosed with mycotic nail infection by Dr. Paulla Dolly.  ? ?Toenail most affected left hallux. ? ?All other systems are negative. ? ?Nails were filed thin. Laser therapy was administered to left great  toenail and patient tolerated the treatment well. All safety precautions were in place.  ? ? ?Follow up in 4 weeks for laser # 3. ? ?Picture of nails taken today to document visual progress  ?

## 2021-08-04 ENCOUNTER — Emergency Department (HOSPITAL_BASED_OUTPATIENT_CLINIC_OR_DEPARTMENT_OTHER)
Admission: EM | Admit: 2021-08-04 | Discharge: 2021-08-04 | Disposition: A | Discharge status: Home / Self Care | Payer: Medicare Other | Attending: Emergency Medicine | Admitting: Emergency Medicine

## 2021-08-04 ENCOUNTER — Emergency Department (HOSPITAL_BASED_OUTPATIENT_CLINIC_OR_DEPARTMENT_OTHER): Payer: Medicare Other | Admitting: Radiology

## 2021-08-04 ENCOUNTER — Other Ambulatory Visit: Payer: Self-pay

## 2021-08-04 ENCOUNTER — Encounter (HOSPITAL_BASED_OUTPATIENT_CLINIC_OR_DEPARTMENT_OTHER): Payer: Self-pay

## 2021-08-04 DIAGNOSIS — K5900 Constipation, unspecified: Secondary | ICD-10-CM | POA: Insufficient documentation

## 2021-08-04 DIAGNOSIS — R110 Nausea: Secondary | ICD-10-CM | POA: Diagnosis not present

## 2021-08-04 NOTE — ED Notes (Signed)
At discharge patient stated, "this is the worst experience I ever had. I had to wait hours just for an enema. I should have gone to urgent care." ?

## 2021-08-04 NOTE — ED Notes (Signed)
Pt disconnected to go to the restroom ?

## 2021-08-04 NOTE — ED Notes (Signed)
Patient transported to X-ray 

## 2021-08-04 NOTE — ED Provider Notes (Signed)
?[facility] ?Provider Note ? ? ?CSN: [id number] ?Arrival date & time: 08/04/21  1600 ? ?  ? ?History ? ?Chief Complaint  ?Patient presents with  ? Constipation  ? ? ?Sherry [name] is a 76 y.o. female. ? ?Patient is a 76 year old female who presents with constipation.  She has a history of IBS-C with chronic constipation.  She says she normally keeps it under fairly good control.  Recently she has had some worsening constipation with increased pain in her rectum and feels like she may have an impaction.  She had an impaction several years ago that had to be manually disimpacted.  She has tried over-the-counter medicines including mag citrate and fleets enemas without improvement in symptoms.  She at times has had some difficulty urinating due to the large amount of stool but says she is urinating okay currently.  She has no fevers.  She has some mild nausea.  No vomiting.  She has some crampy abdominal pain and bloating.  Her symptoms are similar to her prior episodes of constipation. ? ? ?  ? ?Home Medications ?Prior to Admission medications   ?Medication Sig Start Date End Date Taking? Authorizing Provider  ?diclofenac Sodium (VOLTAREN) 1 % GEL Apply 1 application topically daily as needed (pain).    [provider]  ?diphenhydramine-acetaminophen (TYLENOL PM) 25-500 MG TABS tablet Take 0.5 tablets by mouth at bedtime as needed (pain).    [provider]  ?famotidine (PEPCID) 20 MG tablet Take 20 mg by mouth as needed for heartburn or indigestion.    [provider]  ?gabapentin (NEURONTIN) 300 MG capsule Take 300 mg by mouth at bedtime.     [provider]  ?ketoconazole (NIZORAL) 2 % cream Apply 1 application topically daily as needed (scaly skin).    [provider]  ?ketoconazole (NIZORAL) 2 % shampoo Apply 1 application topically daily as needed (itchy scalp).  01/12/19   [provider]  ?polyethylene glycol (MIRALAX) packet  Take 17 g by mouth 3 (three) times daily. Until bowels move; maximum 2 consecutive days ?Patient taking differently: Take 8.5-17 g by mouth daily as needed for moderate constipation. 10/09/14   Charlann Lange, PA-C  ?Propylene Glycol (SYSTANE BALANCE) 0.6 % SOLN Place 1 drop into both eyes daily as needed (dry eyes).    [provider]  ?terbinafine (LAMISIL) 250 MG tablet Take 1 tablet (250 mg total) by mouth daily. 06/03/21   Wallene Huh, DPM  ?   ? ?Allergies    ?Adhesive [tape] and Mederma   ? ?Review of Systems   ?Review of Systems  ?Constitutional:  Negative for chills, diaphoresis, fatigue and fever.  ?HENT:  Negative for congestion, rhinorrhea and sneezing.   ?Eyes: Negative.   ?Respiratory:  Negative for cough, chest tightness and shortness of breath.   ?Cardiovascular:  Negative for chest pain and leg swelling.  ?Gastrointestinal:  Positive for abdominal pain, constipation and nausea. Negative for blood in stool, diarrhea and vomiting.  ?Genitourinary:  Negative for difficulty urinating, flank pain, frequency and hematuria.  ?Musculoskeletal:  Negative for arthralgias and back pain.  ?Skin:  Negative for rash.  ?Neurological:  Negative for dizziness, speech difficulty, weakness, numbness and headaches.  ? ?Physical Exam ?Updated Vital Signs ?BP (!) 142/85   Pulse 72   Temp 97.9 ?F (36.6 ?C)   Resp 18   Ht '5\' 5"'$  (1.651 m)   Wt 68 kg   LMP 04/14/1997   SpO2 100%  BMI 24.96 kg/m?  ?Physical Exam ?Constitutional:   ?   Appearance: She is well-developed.  ?HENT:  ?   Head: Normocephalic and atraumatic.  ?Eyes:  ?   Pupils: Pupils are equal, round, and reactive to light.  ?Cardiovascular:  ?   Rate and Rhythm: Normal rate and regular rhythm.  ?   Heart sounds: Normal heart sounds.  ?Pulmonary:  ?   Effort: Pulmonary effort is normal. No respiratory distress.  ?   Breath sounds: Normal breath sounds. No wheezing or rales.  ?Chest:  ?   Chest wall: No tenderness.  ?Abdominal:  ?   General:  Bowel sounds are normal.  ?   Palpations: Abdomen is soft.  ?   Tenderness: There is no abdominal tenderness. There is no guarding or rebound.  ?Genitourinary: ?   Comments: No impaction, soft stool high in the rectum ?Musculoskeletal:     ?   General: Normal range of motion.  ?   Cervical back: Normal range of motion and neck supple.  ?Lymphadenopathy:  ?   Cervical: No cervical adenopathy.  ?Skin: ?   General: Skin is warm and dry.  ?   Findings: No rash.  ?Neurological:  ?   Mental Status: She is alert and oriented to person, place, and time.  ? ? ?ED Results / Procedures / Treatments   ?Labs ?(all labs ordered are listed, but only abnormal results are displayed) ?Labs Reviewed - No data to display ? ?EKG ?None ? ?Radiology ?DG ABD ACUTE 2+V W 1V CHEST ? ?Result Date: 08/04/2021 ?CLINICAL DATA:  Constipation EXAM: DG ABDOMEN ACUTE WITH 1 VIEW CHEST COMPARISON:  07/03/2017 FINDINGS: There is no evidence of dilated bowel loops or free intraperitoneal air. No radiopaque calculi or other significant radiographic abnormality is seen. Heart size and mediastinal contours are within normal limits. Both lungs are clear. IMPRESSION: Negative abdominal radiographs.  No acute cardiopulmonary disease. Electronically Signed   By: Ulyses Jarred M.D.   On: 08/04/2021 21:30   ? ?Procedures ?Procedures  ? ? ?Medications Ordered in ED ?Medications - No data to display ? ?ED Course/ Medical Decision Making/ A&P ?  ?                        ?Medical Decision Making ?Amount and/or Complexity of Data Reviewed ?Radiology: ordered. ? ? ?Patient is a 76 year old female who presents with constipation.  Her abdominal exam is benign.  She has no vomiting.  She has a history of chronic constipation.  No fecal impaction noted on rectal exam.  He abdominal series was interpreted by me.  There is no evidence of obstruction.  She was given a soapsuds enema and per the RN, she had a large bowel movement. ? ?On my reexam, patient is fully dressed  sitting in her chair.  She said that she still feels like she has stool in her rectum.  She does not know if she had a large bowel movement or if it was more just liquid.  I did offer her to have a repeat enema if she still felt like there was a lot of stool in her rectum.  However she says that she is ready to go home and does not want any further treatment here tonight.  I encouraged her to have contact with her primary care doctor tomorrow regarding ongoing treatment of her constipation.  Return precautions were given. ? ?Final Clinical Impression(s) / ED Diagnoses ?Final diagnoses:  ?Constipation,  unspecified constipation type  ? ? ?Rx / DC Orders ?ED Discharge Orders   ? ? None  ? ?  ? ? ?  ?Malvin Johns, MD ?08/04/21 2307 ? ?

## 2021-08-04 NOTE — ED Notes (Signed)
Instilled 1332m's soap suds enema.  ? ?Call light within reach with bedside commode at bedside ?

## 2021-08-04 NOTE — ED Triage Notes (Signed)
Pt presents with constipation and rectal pressure. Pt reports last BM was 5 days ago. Pt states the pressure is also making it difficulty to urinate. Today pt has taken 2 Miralax, 2 enemas, mag citrate, dulcolax suppository, and a dulcolax pill without relief.  ?

## 2021-08-04 NOTE — ED Notes (Signed)
Enema successful. Pt had a large BM ?

## 2021-08-08 DIAGNOSIS — M1811 Unilateral primary osteoarthritis of first carpometacarpal joint, right hand: Secondary | ICD-10-CM | POA: Diagnosis not present

## 2021-08-08 DIAGNOSIS — M79642 Pain in left hand: Secondary | ICD-10-CM | POA: Diagnosis not present

## 2021-08-08 DIAGNOSIS — M79641 Pain in right hand: Secondary | ICD-10-CM | POA: Diagnosis not present

## 2021-08-30 ENCOUNTER — Ambulatory Visit (INDEPENDENT_AMBULATORY_CARE_PROVIDER_SITE_OTHER): Payer: Self-pay

## 2021-08-30 DIAGNOSIS — B351 Tinea unguium: Secondary | ICD-10-CM

## 2021-08-30 NOTE — Progress Notes (Signed)
Patient presents today for the 3rd laser treatment. Diagnosed with mycotic nail infection by Dr. Paulla Dolly.   Toenail most affected left hallux.  All other systems are negative.  Nails were filed thin. Laser therapy was administered to left great  toenail and patient tolerated the treatment well. All safety precautions were in place.   Patient purchased formula 7 today   Follow up in 6 weeks for laser # 4.  Picture of nails taken today to document visual progress

## 2021-10-25 ENCOUNTER — Ambulatory Visit (INDEPENDENT_AMBULATORY_CARE_PROVIDER_SITE_OTHER): Payer: Self-pay | Admitting: Podiatry

## 2021-10-25 DIAGNOSIS — B351 Tinea unguium: Secondary | ICD-10-CM

## 2021-10-25 NOTE — Progress Notes (Signed)
Patient presents today for the 4th laser treatment. Diagnosed with mycotic nail infection by Dr. Paulla Dolly.    Toenail most affected left hallux.   All other systems are negative.   Nails were filed thin. Laser therapy was administered to left great  toenail and patient tolerated the treatment well. All safety precautions were in place.    Patient purchased formula 7 today and using occasionally.    Follow up in 6 weeks for laser # 5.

## 2021-11-22 ENCOUNTER — Other Ambulatory Visit: Payer: Self-pay | Admitting: *Deleted

## 2021-11-22 NOTE — Patient Outreach (Signed)
  Care Coordination   Initial Visit Note   11/22/2021 Name: AKEIBA AXELSON MRN: [id number] DOB: 28-Dec-1945  MICHELENA CULMER is a 76 y.o. year old female who sees Josetta Huddle, MD for primary care. I spoke with  Pricilla Holm by phone today  Per patient I am driving [place] from the beach  and don't have time to talk. Could you call me back later.     SDOH assessments and interventions completed:  No     Care Coordination Interventions Activated:  Yes  Care Coordination Interventions:  No, not indicated   Follow up plan: Follow up call scheduled for [id number]  1030  Encounter Outcome:  Pt. Request to Call [facility] [phone]

## 2021-11-27 DIAGNOSIS — M1811 Unilateral primary osteoarthritis of first carpometacarpal joint, right hand: Secondary | ICD-10-CM | POA: Diagnosis not present

## 2021-11-27 DIAGNOSIS — M79642 Pain in left hand: Secondary | ICD-10-CM | POA: Diagnosis not present

## 2021-11-29 ENCOUNTER — Encounter: Payer: Self-pay | Admitting: *Deleted

## 2021-11-29 ENCOUNTER — Other Ambulatory Visit: Payer: Self-pay | Admitting: *Deleted

## 2021-11-29 NOTE — Patient Outreach (Signed)
  Care Coordination   11/29/2021 Name: GRICELDA FOLAND MRN: [id number] DOB: [birth year]   Care Coordination Outreach Attempts:  An unsuccessful telephone outreach was attempted today to offer the patient information about available care coordination services as a benefit of their health plan.   Follow Up Plan:  Additional outreach attempts will be made to offer the patient care coordination information and services.   Encounter Outcome:  No Answer  Care Coordination Interventions Activated:  No   Care Coordination Interventions:  No, not indicated    Emelia Loron RN, BSN [place] [phone] [email or web address]

## 2021-12-08 DIAGNOSIS — S52502A Unspecified fracture of the lower end of left radius, initial encounter for closed fracture: Secondary | ICD-10-CM | POA: Diagnosis not present

## 2021-12-09 DIAGNOSIS — M25532 Pain in left wrist: Secondary | ICD-10-CM | POA: Diagnosis not present

## 2021-12-10 DIAGNOSIS — J45909 Unspecified asthma, uncomplicated: Secondary | ICD-10-CM | POA: Diagnosis not present

## 2021-12-10 DIAGNOSIS — R058 Other specified cough: Secondary | ICD-10-CM | POA: Diagnosis not present

## 2021-12-10 DIAGNOSIS — Z03818 Encounter for observation for suspected exposure to other biological agents ruled out: Secondary | ICD-10-CM | POA: Diagnosis not present

## 2021-12-11 DIAGNOSIS — S52502D Unspecified fracture of the lower end of left radius, subsequent encounter for closed fracture with routine healing: Secondary | ICD-10-CM | POA: Diagnosis not present

## 2021-12-30 DIAGNOSIS — S52502D Unspecified fracture of the lower end of left radius, subsequent encounter for closed fracture with routine healing: Secondary | ICD-10-CM | POA: Diagnosis not present

## 2021-12-30 DIAGNOSIS — R052 Subacute cough: Secondary | ICD-10-CM | POA: Diagnosis not present

## 2022-01-10 ENCOUNTER — Other Ambulatory Visit: Payer: Self-pay | Admitting: Internal Medicine

## 2022-01-10 ENCOUNTER — Ambulatory Visit (INDEPENDENT_AMBULATORY_CARE_PROVIDER_SITE_OTHER): Payer: Medicare Other | Admitting: *Deleted

## 2022-01-10 DIAGNOSIS — R519 Headache, unspecified: Secondary | ICD-10-CM | POA: Diagnosis not present

## 2022-01-10 DIAGNOSIS — R531 Weakness: Secondary | ICD-10-CM | POA: Diagnosis not present

## 2022-01-10 DIAGNOSIS — R350 Frequency of micturition: Secondary | ICD-10-CM | POA: Diagnosis not present

## 2022-01-10 DIAGNOSIS — G4700 Insomnia, unspecified: Secondary | ICD-10-CM | POA: Diagnosis not present

## 2022-01-10 DIAGNOSIS — R269 Unspecified abnormalities of gait and mobility: Secondary | ICD-10-CM | POA: Diagnosis not present

## 2022-01-10 DIAGNOSIS — B351 Tinea unguium: Secondary | ICD-10-CM

## 2022-01-10 DIAGNOSIS — R053 Chronic cough: Secondary | ICD-10-CM | POA: Diagnosis not present

## 2022-01-10 NOTE — Progress Notes (Signed)
Patient presents today for the 5th laser treatment. Diagnosed with mycotic nail infection by Dr. Paulla Dolly.    Toenail most affected left hallux. The nail is starting to show healthy growth.   All other systems are negative.   Nails were filed thin. Laser therapy was administered to hallux left toenail and patient tolerated the treatment well. All safety precautions were in place.    Patient is no longer using the Formula 7.    Follow up in 8 weeks for laser # 6

## 2022-01-13 DIAGNOSIS — S52502D Unspecified fracture of the lower end of left radius, subsequent encounter for closed fracture with routine healing: Secondary | ICD-10-CM | POA: Diagnosis not present

## 2022-01-16 DIAGNOSIS — E8889 Other specified metabolic disorders: Secondary | ICD-10-CM | POA: Diagnosis not present

## 2022-01-16 DIAGNOSIS — M2550 Pain in unspecified joint: Secondary | ICD-10-CM | POA: Diagnosis not present

## 2022-01-16 DIAGNOSIS — Z8639 Personal history of other endocrine, nutritional and metabolic disease: Secondary | ICD-10-CM | POA: Diagnosis not present

## 2022-01-16 DIAGNOSIS — R5383 Other fatigue: Secondary | ICD-10-CM | POA: Diagnosis not present

## 2022-01-16 DIAGNOSIS — R42 Dizziness and giddiness: Secondary | ICD-10-CM | POA: Diagnosis not present

## 2022-01-16 DIAGNOSIS — K219 Gastro-esophageal reflux disease without esophagitis: Secondary | ICD-10-CM | POA: Diagnosis not present

## 2022-01-16 DIAGNOSIS — Z6824 Body mass index (BMI) 24.0-24.9, adult: Secondary | ICD-10-CM | POA: Diagnosis not present

## 2022-01-16 DIAGNOSIS — E559 Vitamin D deficiency, unspecified: Secondary | ICD-10-CM | POA: Diagnosis not present

## 2022-01-16 DIAGNOSIS — K589 Irritable bowel syndrome without diarrhea: Secondary | ICD-10-CM | POA: Diagnosis not present

## 2022-01-28 ENCOUNTER — Ambulatory Visit
Admission: RE | Admit: 2022-01-28 | Discharge: 2022-01-28 | Disposition: A | Discharge status: Home / Self Care | Payer: Medicare Other | Source: Ambulatory Visit | Attending: Internal Medicine | Admitting: Internal Medicine

## 2022-01-28 DIAGNOSIS — R519 Headache, unspecified: Secondary | ICD-10-CM

## 2022-01-29 DIAGNOSIS — Z96612 Presence of left artificial shoulder joint: Secondary | ICD-10-CM | POA: Diagnosis not present

## 2022-01-30 DIAGNOSIS — E559 Vitamin D deficiency, unspecified: Secondary | ICD-10-CM | POA: Diagnosis not present

## 2022-01-30 DIAGNOSIS — K589 Irritable bowel syndrome without diarrhea: Secondary | ICD-10-CM | POA: Diagnosis not present

## 2022-01-30 DIAGNOSIS — R7303 Prediabetes: Secondary | ICD-10-CM | POA: Diagnosis not present

## 2022-02-11 ENCOUNTER — Other Ambulatory Visit: Payer: Self-pay | Admitting: Internal Medicine

## 2022-02-11 DIAGNOSIS — R7303 Prediabetes: Secondary | ICD-10-CM | POA: Diagnosis not present

## 2022-02-11 DIAGNOSIS — R42 Dizziness and giddiness: Secondary | ICD-10-CM | POA: Diagnosis not present

## 2022-02-12 DIAGNOSIS — Z961 Presence of intraocular lens: Secondary | ICD-10-CM | POA: Diagnosis not present

## 2022-02-12 DIAGNOSIS — H04123 Dry eye syndrome of bilateral lacrimal glands: Secondary | ICD-10-CM | POA: Diagnosis not present

## 2022-02-12 DIAGNOSIS — H524 Presbyopia: Secondary | ICD-10-CM | POA: Diagnosis not present

## 2022-02-17 ENCOUNTER — Other Ambulatory Visit: Payer: Self-pay | Admitting: Internal Medicine

## 2022-02-17 DIAGNOSIS — Z1231 Encounter for screening mammogram for malignant neoplasm of breast: Secondary | ICD-10-CM

## 2022-02-21 ENCOUNTER — Ambulatory Visit (INDEPENDENT_AMBULATORY_CARE_PROVIDER_SITE_OTHER): Payer: Medicare Other | Admitting: *Deleted

## 2022-02-21 DIAGNOSIS — B351 Tinea unguium: Secondary | ICD-10-CM

## 2022-02-21 NOTE — Progress Notes (Signed)
Patient presents today for the 6th laser treatment. Diagnosed with mycotic nail infection by Dr. Paulla Dolly.    Toenail most affected left hallux. The nail is starting to show healthy growth.   All other systems are negative.   Nails were filed thin. Laser therapy was administered to hallux left toenail and patient tolerated the treatment well. All safety precautions were in place.    Patient is no longer using the Formula 7.    Patient has completed the recommended laser treatments. He will follow up with Dr. Paulla Dolly in 3 months to evaluate progress.

## 2022-03-02 ENCOUNTER — Ambulatory Visit
Admission: RE | Admit: 2022-03-02 | Discharge: 2022-03-02 | Disposition: A | Discharge status: Home / Self Care | Payer: Medicare Other | Source: Ambulatory Visit | Attending: Internal Medicine | Admitting: Internal Medicine

## 2022-03-02 DIAGNOSIS — R42 Dizziness and giddiness: Secondary | ICD-10-CM

## 2022-03-02 MED ORDER — GADOPICLENOL 0.5 MMOL/ML IV SOLN
7.0000 mL | Freq: Once | INTRAVENOUS | Status: AC | PRN
  Administered 2022-03-02: 7 mL via INTRAVENOUS

## 2022-03-17 DIAGNOSIS — R2689 Other abnormalities of gait and mobility: Secondary | ICD-10-CM | POA: Diagnosis not present

## 2022-03-17 DIAGNOSIS — Z6824 Body mass index (BMI) 24.0-24.9, adult: Secondary | ICD-10-CM | POA: Diagnosis not present

## 2022-03-17 DIAGNOSIS — K581 Irritable bowel syndrome with constipation: Secondary | ICD-10-CM | POA: Diagnosis not present

## 2022-03-17 DIAGNOSIS — R790 Abnormal level of blood mineral: Secondary | ICD-10-CM | POA: Diagnosis not present

## 2022-03-18 ENCOUNTER — Ambulatory Visit: Payer: Medicare Other | Attending: Internal Medicine | Admitting: Physical Therapy

## 2022-03-18 DIAGNOSIS — H5711 Ocular pain, right eye: Secondary | ICD-10-CM | POA: Diagnosis not present

## 2022-03-18 DIAGNOSIS — R2681 Unsteadiness on feet: Secondary | ICD-10-CM | POA: Insufficient documentation

## 2022-03-18 NOTE — Therapy (Signed)
OUTPATIENT PHYSICAL THERAPY VESTIBULAR EVALUATION     Patient Name: Sherry [name] MRN: [id number] DOB:05/16/1945, 76 y.o., female Today's Date: 03/19/2022  END OF SESSION:  PT End of Session - 03/19/22 1901     Visit Number 1    Authorization Type [employer] Medicare    PT Start Time 1147    PT Stop Time 1243    PT Time Calculation (min) 56 min    Equipment Utilized During Treatment Other (comment)   harness vest used on SOT   Activity Tolerance Patient tolerated treatment well    Behavior During Therapy WFL for tasks assessed/performed             Past Medical History:  Diagnosis Date   Anxiety    Cancer ([place])    Carpal tunnel syndrome on both sides    Chronic back pain    herniated disc,stenosis,radiculopathy,spondylolisthesis   Chronic neck pain    Constipation, chronic    Degenerative spinal arthritis    cervical and lumbar   Depression    Eczema    GERD (gastroesophageal reflux disease)    Hemorrhoids    Hiatal hernia    History of bronchitis    "I've had it a number of times"   History of COVID-19 04/22/2019   IBS (irritable bowel syndrome)    lactose intolerant   Osteoarthritis    bilateral knees and shoulders,  little fingers   TMJ (dislocation of temporomandibular joint)    Wears glasses    Past Surgical History:  Procedure Laterality Date   APPENDECTOMY  1967   BACK SURGERY     BLADDER SUSPENSION  1980's   sling   BREAST BIOPSY Left 1980's   "twice on the left"   BREAST LUMPECTOMY     x 2   BREAST REDUCTION SURGERY Bilateral 1988   CARPAL TUNNEL RELEASE Bilateral 2020   CERVIX LESION DESTRUCTION  age 58s   [place], ARTHROTOMY W/ SYNOVECTOMY Left 07-21-2008  dr sypher [facility]   Left long finger   [place] / [place] / KNEE / THIGH  1994   "day after knee scope"   JOINT REPLACEMENT     bilateral knees    KNEE ARTHROSCOPY  1994   unilateral    POSTERIOR FUSION LUMBAR SPINE  02/2011   L3-L5   REDUCTION MAMMAPLASTY Bilateral    TONSILLECTOMY  1951   TOTAL KNEE ARTHROPLASTY Bilateral 05/27/2017   Procedure: TOTAL KNEE BILATERAL;  Surgeon: Gaynelle Arabian, MD;  Location: [facility];  Service: Orthopedics;  Laterality: Bilateral;   TOTAL SHOULDER ARTHROPLASTY Right 03/01/2020   Procedure: Right shoulder anatomic arthroplasty;  Surgeon: Justice Britain, MD;  Location: [facility];  Service: Orthopedics;  Laterality: Right;  124mn   TOTAL SHOULDER ARTHROPLASTY Left 01/17/2021   Procedure: TOTAL SHOULDER ARTHROPLASTY;  Surgeon: SJustice Britain MD;  Location: [facility];  Service: Orthopedics;  Laterality: Left;  1223m   Patient Active Problem List   Diagnosis Date Noted   Rectocele 05/27/2018   Anticoagulated    Anxiety state    Sleep disturbance    Subtherapeutic international normalized ratio (INR)    Reactive depression    Post-operative pain    Tachycardia    Hypoalbuminemia due to protein-calorie malnutrition (HCC)    Constipation due to pain medication    Acute blood loss anemia    Status post bilateral knee replacements 06/02/2017   OA (osteoarthritis) of  knee 05/27/2017   Flank pain 10/03/2011   Abdominal pain 09/30/2011   DEGENERATIVE DISC DISEASE, LUMBAR SPINE 05/07/2010   BACK PAIN, LUMBAR 05/07/2010   Abdominal pain, unspecified site 06/08/2009   RLQ PAIN 03/30/2009   ABNORMAL FINDINGS GI TRACT 03/30/2009   ANXIETY 03/22/2009   DEPRESSION 03/22/2009   GERD 03/22/2009   Constipation 03/22/2009   FLATULENCE-GAS-BLOATING 03/22/2009   PERSONAL HX COLONIC POLYPS 03/22/2009    PCP: Charlane Ferretti, MD REFERRING PROVIDER: Charlane Ferretti, MD  REFERRING DIAG:  R42 (ICD-10-CM) - Dizziness and giddiness  R42 (ICD-10-CM) - Vertigo    THERAPY DIAG:  Unsteadiness on feet  ONSET DATE: August 2023  Rationale for Evaluation and Treatment: Rehabilitation  SUBJECTIVE:   SUBJECTIVE STATEMENT: Pt states she fell in mid- August and  broke her wrist and is now not taking any chances, states "I'm just holding onto things"; had cold/sinus infection about that same time that may have contributed to it Pt accompanied by: self  PERTINENT HISTORY: anxiety, tachycardia  PAIN:  Are you having pain? No   PRECAUTIONS: None  WEIGHT BEARING RESTRICTIONS: No  FALLS: Has patient fallen in last 6 months? Yes. Number of falls 1  LIVING ENVIRONMENT: Lives with: lives with their spouse Lives in: House/apartment Stairs: Yes: Internal: 12 steps; on right going up and External: 1 steps; none Has following equipment at home: None  PLOF: Independent  PATIENT GOALS: Improve balance; get exercises to do at home - getting ready to go skiing in March   OBJECTIVE:   DIAGNOSTIC FINDINGS: N/A  COGNITION: Overall cognitive status: Within functional limits for tasks assessed   SENSATION: WFL  Cervical ROM:  WNL's   STRENGTH: WNL's   BED MOBILITY:  Independent  TRANSFERS: Assistive device utilized: None  Sit to stand: Complete Independence Stand to sit: Complete Independence  GAIT: Gait pattern: WFL No device used  FUNCTIONAL TESTS:  Neuro Com/Balance master: Composite score: 66/100 :  N= 64/100 (WNL's)  Somatosensory and visual inputs WNL's; vestibular input decreased  at 45/100 with N= 50/100 per SOT  PATIENT SURVEYS:  FOTO Not captured by front office  VESTIBULAR ASSESSMENT:  GENERAL OBSERVATION: Pt reports imbalance/dysequilibrium - no dizziness reported at this time    FUNCTIONAL GAIT: Functional gait assessment: 29/30    [facility] PT Assessment - 03/19/22 0001       Functional Gait  Assessment   Gait assessed  Yes (P)     Gait Level Surface Walks 20 ft in less than 5.5 sec, no assistive devices, good speed, no evidence for imbalance, normal gait pattern, deviates no more than 6 in outside of the 12 in walkway width. (P)     Change in Gait Speed Able to smoothly change walking speed without loss of  balance or gait deviation. Deviate no more than 6 in outside of the 12 in walkway width. (P)     Gait with Horizontal Head Turns Performs head turns smoothly with slight change in gait velocity (eg, minor disruption to smooth gait path), deviates 6-10 in outside 12 in walkway width, or uses an assistive device. (P)     Gait with Vertical Head Turns Performs head turns with no change in gait. Deviates no more than 6 in outside 12 in walkway width. (P)     Gait and Pivot Turn Pivot turns safely within 3 sec and stops quickly with no loss of balance. (P)     Step Over Obstacle Is able to step over 2 stacked shoe  boxes taped together (9 in total height) without changing gait speed. No evidence of imbalance. (P)     Gait with Narrow Base of Support Is able to ambulate for 10 steps heel to toe with no staggering. (P)     Gait with Eyes Closed Walks 20 ft, no assistive devices, good speed, no evidence of imbalance, normal gait pattern, deviates no more than 6 in outside 12 in walkway width. Ambulates 20 ft in less than 7 sec. (P)     Ambulating Backwards Walks 20 ft, no assistive devices, good speed, no evidence for imbalance, normal gait (P)     Steps Alternating feet, no rail. (P)     Total Score 29 (P)               PATIENT EDUCATION: Education details: HEP issued; eval results explained to pt Person educated: Patient Education method: Explanation, Demonstration, and Handouts Education comprehension: verbalized understanding and returned demonstration  HOME EXERCISE PROGRAM:  Pt instructed in SLS, tandem stance and balance on foam for HEP  GOALS: N/A - eval only  ASSESSMENT:  CLINICAL IMPRESSION: Patient is a 76 y.o. lady who was seen today for physical therapy evaluation and treatment for balance deficits.  Pt reports no dizziness at this time. Pt presents with only minimal decreased high level balance deficits with FGA score 29/100;  SOT composite score is WNL's with score 66/100  (N=64/100).  Somatosensory & visual input WNL's with vesibular input slightly decreased at 45/100 with N= 50/100.  Pt requested HEP only - skilled PT intervention not warranted at this time.   OBJECTIVE IMPAIRMENTS:  N/A .   ACTIVITY LIMITATIONS:  N/A  PARTICIPATION LIMITATIONS:  N/A  PERSONAL FACTORS:  N/A  are also affecting patient's functional outcome.   REHAB POTENTIAL: Good  CLINICAL DECISION MAKING: Stable/uncomplicated  EVALUATION COMPLEXITY: Low   PLAN:  PT FREQUENCY: one time visit  PT DURATION: 1 week  PLANNED INTERVENTIONS: Patient/Family education and Self Care  PLAN FOR NEXT SESSION: N/A - eval only; pt was instructed in HEP; was instructed to call with any questions and/or concerns   Alda Lea, PT 03/19/2022, 7:03 PM

## 2022-03-18 NOTE — Patient Instructions (Signed)
   Feet Apart (Compliant Surface) Head Motion - Eyes Closed    Stand on compliant surface: _PILLOWS_______ with feet shoulder width apart. Close eyes and move head slowly, up and down. Repeat _1___ times per session. Do __1-2__ sessions per day.   HEAD TURNS SIDE TO SIDE AND UP/DOWN FOR INCREASED CHALLENGE    Tandem Stance - each position    Right foot in front of left, heel touching toe both feet "straight ahead". Stand on Foot Triangle of Support with both feet. Balance in this position _30__ seconds. Do with left foot in front of right.    ALSO PRACTICE WALKING HEEL TO TOE ALONG COUNTER     SINGLE LIMB STANCE    Stance: single leg on floor. Raise leg. Hold _10-15__ seconds. Repeat with other leg. __1_ reps per set, __2_ sets per day, __5_ days per week  Copyright  [employer]. All rights reserved.

## 2022-03-19 ENCOUNTER — Encounter: Payer: Self-pay | Admitting: Physical Therapy

## 2022-03-19 NOTE — Progress Notes (Signed)
   03/19/22 0001  Functional Gait  Assessment  Gait assessed  Yes  Gait Level Surface 3  Change in Gait Speed 3  Gait with Horizontal Head Turns 2  Gait with Vertical Head Turns 3  Gait and Pivot Turn 3  Step Over Obstacle 3  Gait with Narrow Base of Support 3  Gait with Eyes Closed 3  Ambulating Backwards 3  Steps 3  Total Score 29

## 2022-03-25 DIAGNOSIS — L570 Actinic keratosis: Secondary | ICD-10-CM | POA: Diagnosis not present

## 2022-03-25 DIAGNOSIS — D2372 Other benign neoplasm of skin of left lower limb, including hip: Secondary | ICD-10-CM | POA: Diagnosis not present

## 2022-03-25 DIAGNOSIS — D225 Melanocytic nevi of trunk: Secondary | ICD-10-CM | POA: Diagnosis not present

## 2022-03-25 DIAGNOSIS — L218 Other seborrheic dermatitis: Secondary | ICD-10-CM | POA: Diagnosis not present

## 2022-03-25 DIAGNOSIS — L821 Other seborrheic keratosis: Secondary | ICD-10-CM | POA: Diagnosis not present

## 2022-03-25 DIAGNOSIS — L578 Other skin changes due to chronic exposure to nonionizing radiation: Secondary | ICD-10-CM | POA: Diagnosis not present

## 2022-04-17 ENCOUNTER — Telehealth: Payer: Self-pay

## 2022-04-17 NOTE — Patient Instructions (Signed)
Visit Information  Thank you for taking time to visit with me today. Please don't hesitate to contact me if I can be of assistance to you.   Following are the goals we discussed today:   Goals Addressed             This Visit's Progress    COMPLETED: Care Coordination Activities - no follow up required       Care Coordination Interventions: Provided education to patient re: care coordination services Assessed social determinant of health barriers Last Annual Wellness Visit 07/08/21           If you are experiencing a Mental Health or [place] or need someone to talk to, please call the Suicide and Crisis Lifeline: 988 call the [place] National Suicide Prevention Lifeline: 450-205-6164 or TTY: 731-614-8775 TTY 805-329-6290) to talk to a trained counselor call 1-800-273-TALK (toll free, 24 hour hotline) go to [facility] [place] [phone]) call 911   Patient verbalizes understanding of instructions and care plan provided today and agrees to view in [place]. Active MyChart status and patient understanding of how to access instructions and care plan via MyChart confirmed with patient.     No further follow up required:    Peter Garter RN, Jackquline [place], [place] [facility] [phone]

## 2022-04-17 NOTE — Patient Outreach (Signed)
  Care Coordination   Initial Visit Note   04/17/2022 Name: HOLY BATTENFIELD MRN: [id number] DOB: September 16, 1945  Pricilla Holm is a 77 y.o. year old female who sees Charlane Ferretti, MD for primary care. I spoke with  Pricilla Holm by phone today.  What matters to the patients health and wellness today?  No concerns today    Goals Addressed             This Visit's Progress    COMPLETED: Care Coordination Activities - no follow up required       Care Coordination Interventions: Provided education to patient re: care coordination services Assessed social determinant of health barriers Last Annual Wellness Visit 07/08/21          SDOH assessments and interventions completed:  Yes  SDOH Interventions Today    Flowsheet Row Most Recent Value  SDOH Interventions   Food Insecurity Interventions Intervention Not Indicated  Housing Interventions Intervention Not Indicated  Transportation Interventions Intervention Not Indicated  Utilities Interventions Intervention Not Indicated        Care Coordination Interventions:  Yes, provided   Follow up plan: No further intervention required.   Encounter Outcome:  Pt. Visit Completed  Peter Garter RN, BSN,CCM, CDE Care Management Coordinator [place] [facility] [phone]

## 2022-04-18 ENCOUNTER — Ambulatory Visit
Admission: RE | Admit: 2022-04-18 | Discharge: 2022-04-18 | Disposition: A | Discharge status: Home / Self Care | Payer: Medicare Other | Source: Ambulatory Visit | Attending: Internal Medicine | Admitting: Internal Medicine

## 2022-04-18 DIAGNOSIS — Z1231 Encounter for screening mammogram for malignant neoplasm of breast: Secondary | ICD-10-CM

## 2022-05-01 DIAGNOSIS — E559 Vitamin D deficiency, unspecified: Secondary | ICD-10-CM | POA: Diagnosis not present

## 2022-05-01 DIAGNOSIS — R7303 Prediabetes: Secondary | ICD-10-CM | POA: Diagnosis not present

## 2022-05-01 DIAGNOSIS — G4700 Insomnia, unspecified: Secondary | ICD-10-CM | POA: Diagnosis not present

## 2022-06-05 DIAGNOSIS — R7303 Prediabetes: Secondary | ICD-10-CM | POA: Diagnosis not present

## 2022-06-05 DIAGNOSIS — R790 Abnormal level of blood mineral: Secondary | ICD-10-CM | POA: Diagnosis not present

## 2022-07-02 ENCOUNTER — Encounter: Payer: Self-pay | Admitting: Podiatry

## 2022-07-02 ENCOUNTER — Ambulatory Visit: Payer: Medicare Other | Admitting: Podiatry

## 2022-07-02 DIAGNOSIS — B351 Tinea unguium: Secondary | ICD-10-CM

## 2022-07-02 DIAGNOSIS — M180 Bilateral primary osteoarthritis of first carpometacarpal joints: Secondary | ICD-10-CM | POA: Diagnosis not present

## 2022-07-02 MED ORDER — TERBINAFINE HCL 250 MG PO TABS: 250.0000 mg | ORAL_TABLET | Freq: Every day | ORAL | 0 refills | Status: AC

## 2022-07-02 NOTE — Progress Notes (Signed)
Subjective:   Patient ID: Sherry [name], female   DOB: 77 y.o.   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  Patient will be seen back may require pulsed therapy in future with review of all this with patient today

## 2022-07-18 ENCOUNTER — Ambulatory Visit (INDEPENDENT_AMBULATORY_CARE_PROVIDER_SITE_OTHER): Payer: Medicare Other | Admitting: *Deleted

## 2022-07-18 DIAGNOSIS — B351 Tinea unguium: Secondary | ICD-10-CM

## 2022-07-18 NOTE — Progress Notes (Signed)
Patient presents today for the 1st (second round) laser treatment. Diagnosed with mycotic nail infection by Dr. Charlsie Merles.   Toenail most affected hallux left.  All other systems are negative.  Nails were filed thin. Laser therapy was administered to 1st toenails left and patient tolerated the treatment well. All safety precautions were in place.   She is also taking pulse dose of terbinafine.  Follow up in 6 weeks for laser # 2.   ~She will do 3 treatments total at 6 week intervals

## 2022-08-04 DIAGNOSIS — R2689 Other abnormalities of gait and mobility: Secondary | ICD-10-CM | POA: Diagnosis not present

## 2022-08-04 DIAGNOSIS — R790 Abnormal level of blood mineral: Secondary | ICD-10-CM | POA: Diagnosis not present

## 2022-08-04 DIAGNOSIS — R7303 Prediabetes: Secondary | ICD-10-CM | POA: Diagnosis not present

## 2022-08-04 DIAGNOSIS — Z6822 Body mass index (BMI) 22.0-22.9, adult: Secondary | ICD-10-CM | POA: Diagnosis not present

## 2022-08-11 DIAGNOSIS — H52203 Unspecified astigmatism, bilateral: Secondary | ICD-10-CM | POA: Diagnosis not present

## 2022-08-11 DIAGNOSIS — H35371 Puckering of macula, right eye: Secondary | ICD-10-CM | POA: Diagnosis not present

## 2022-08-11 DIAGNOSIS — Z961 Presence of intraocular lens: Secondary | ICD-10-CM | POA: Diagnosis not present

## 2022-08-22 IMAGING — MG DIGITAL SCREENING BILAT W/ TOMO W/ CAD
8 series · 9 of 24 positions shown · non-contrast
Comparison: Previous exam(s).

CLINICAL DATA: Screening.

EXAM:
DIGITAL SCREENING BILATERAL MAMMOGRAM WITH TOMO AND CAD

[R CC synth-2D]
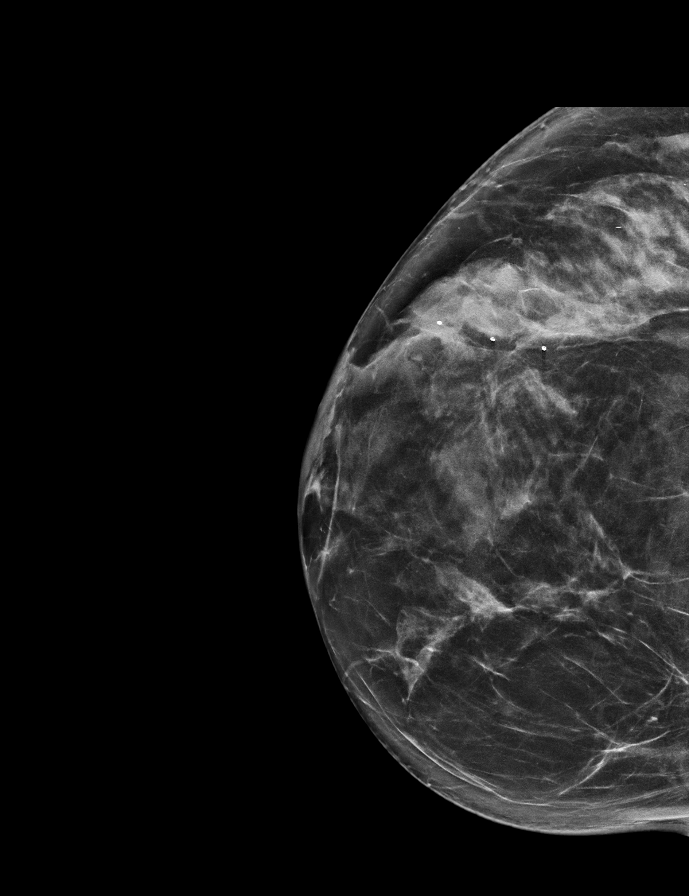

[R MLO synth-2D]
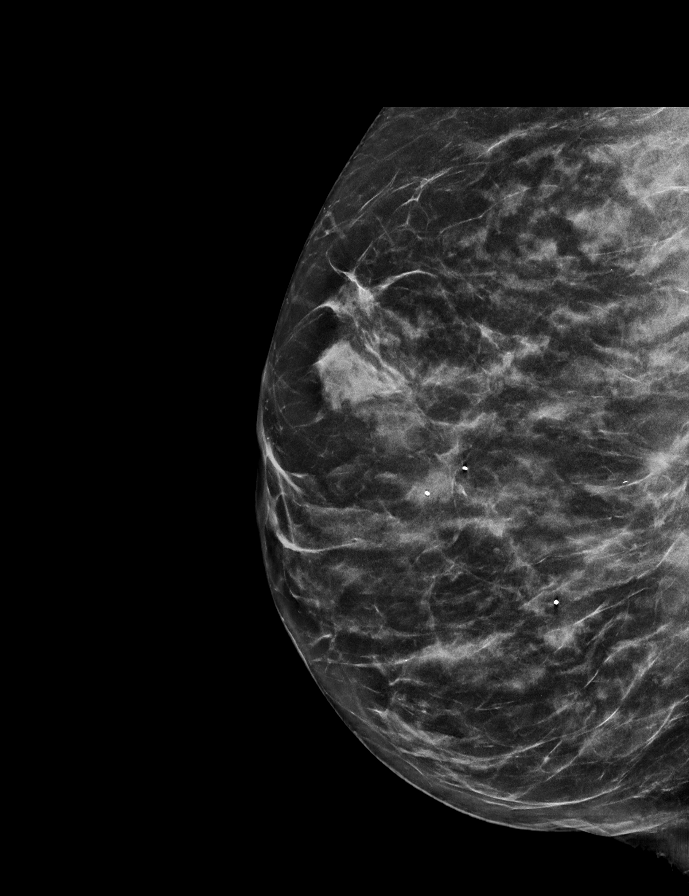

[L CC synth-2D]
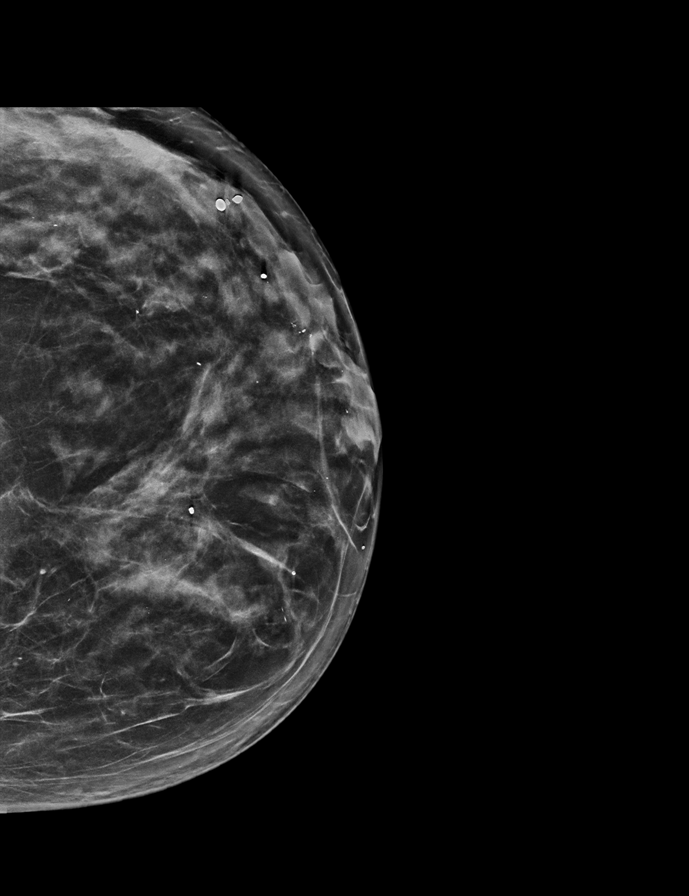

[L MLO synth-2D]
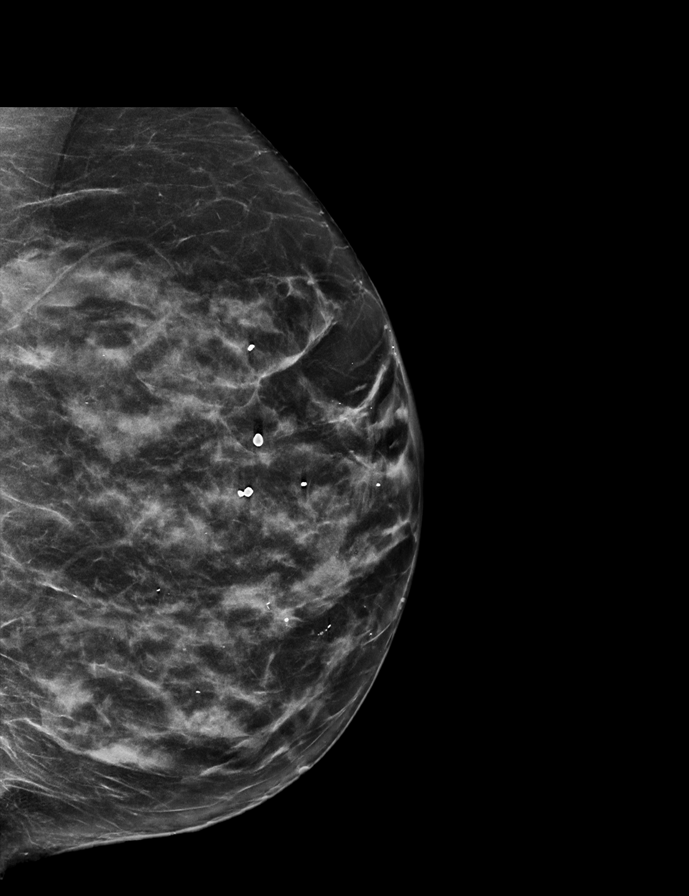

[R CC tomo · 2 of 64 frames shown]
[frame 21/64]
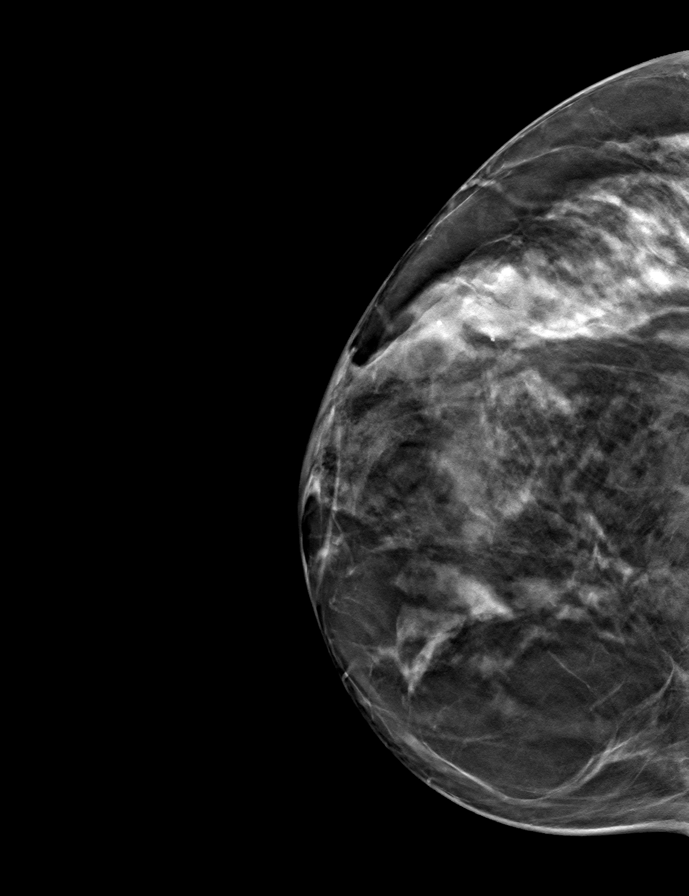
[frame 33/64]
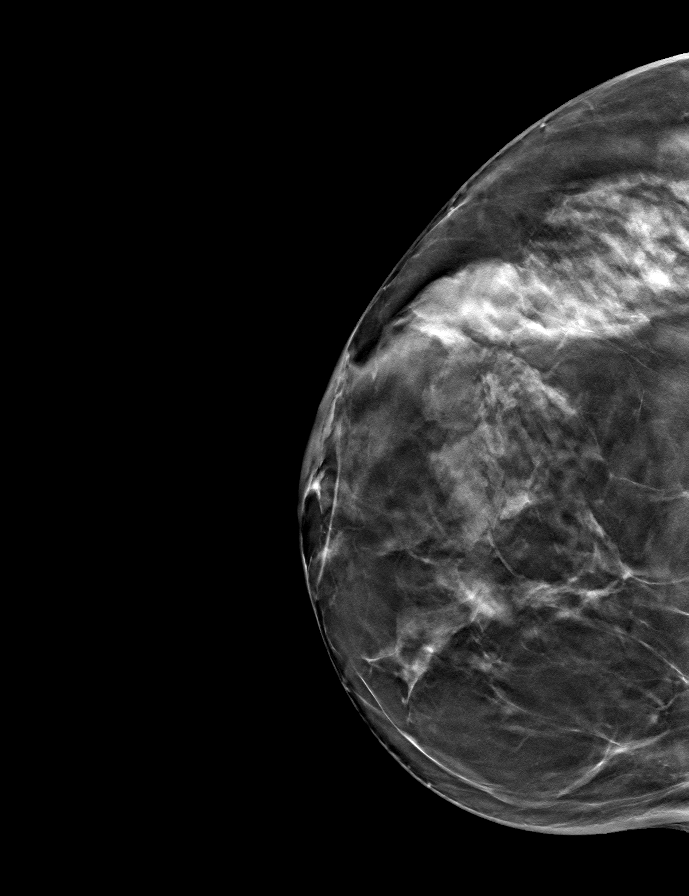

[L MLO tomo · tomo slice 31/61.0]
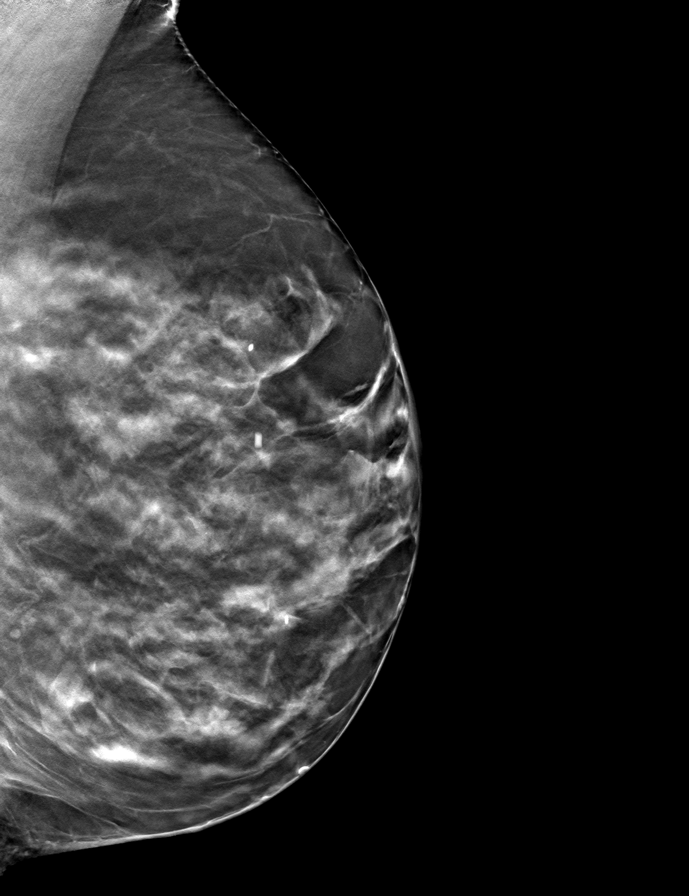

[L CC tomo · tomo slice 33/66.0]
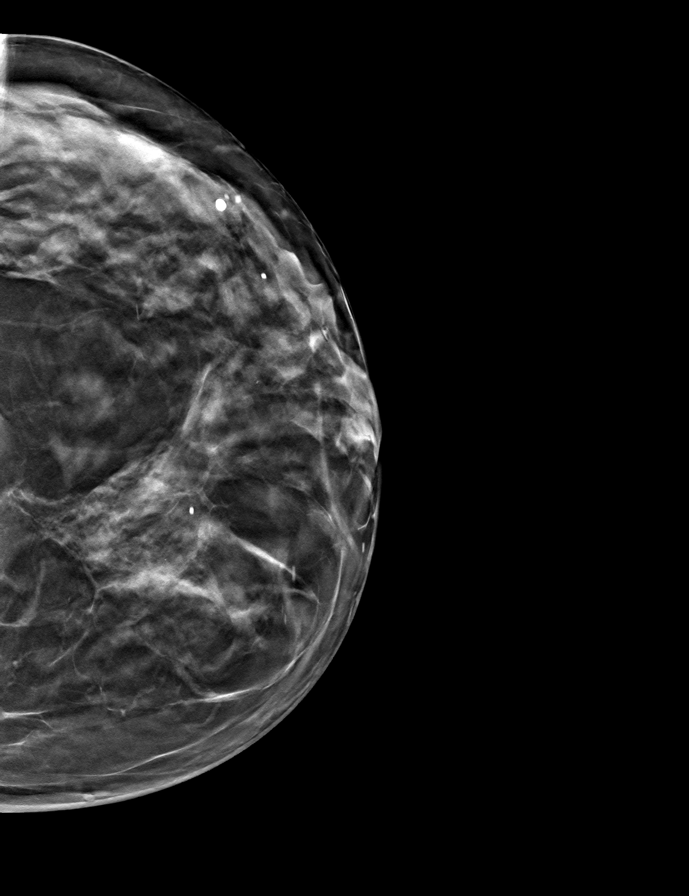

[R MLO tomo · tomo slice 29/58.0]
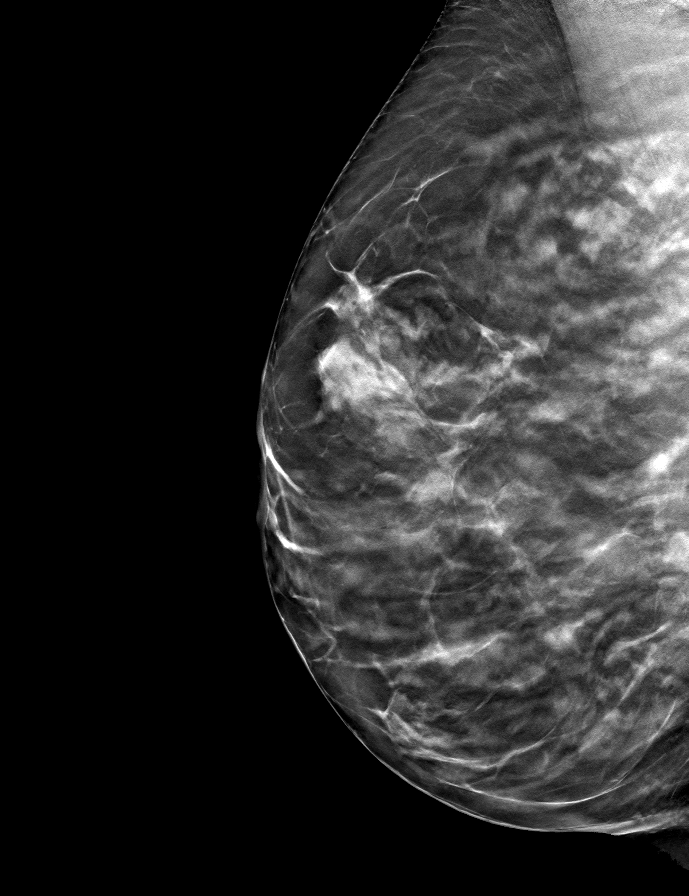

[9 of 24 positions shown; findings below may reference images not displayed]

ACR Breast Density Category c: The breast tissue is heterogeneously
dense, which may obscure small masses.
FINDINGS: In the right breast, a possible mass warrants further evaluation. In
the left breast, no findings suspicious for malignancy. Images were
processed with CAD.
IMPRESSION: Further evaluation is suggested for a possible mass in the right
breast.

RECOMMENDATION:
Diagnostic mammogram and possibly ultrasound of the right breast.
(Code:[id number]-2-99N)

The patient will be contacted regarding the findings, and additional
imaging will be scheduled.

BI-RADS CATEGORY  0: Incomplete. Need additional imaging evaluation
and/or prior mammograms for comparison.

## 2022-09-11 DIAGNOSIS — G44209 Tension-type headache, unspecified, not intractable: Secondary | ICD-10-CM | POA: Diagnosis not present

## 2022-09-12 ENCOUNTER — Ambulatory Visit (INDEPENDENT_AMBULATORY_CARE_PROVIDER_SITE_OTHER): Payer: Medicare Other

## 2022-09-12 DIAGNOSIS — B351 Tinea unguium: Secondary | ICD-10-CM

## 2022-09-12 NOTE — Progress Notes (Signed)
Patient presents today for the 2nd (second round) laser treatment. Diagnosed with mycotic nail infection by Dr. Charlsie Merles.   Toenail most affected hallux left.  All other systems are negative.  Nails were filed thin. Laser therapy was administered to 1st toenails left and patient tolerated the treatment well. All safety precautions were in place.   She is also taking pulse dose of terbinafine.  Follow up in 6 weeks for laser # 3.   ~She will do 3 treatments total at 6 week intervals

## 2022-09-16 DIAGNOSIS — R9089 Other abnormal findings on diagnostic imaging of central nervous system: Secondary | ICD-10-CM | POA: Diagnosis not present

## 2022-09-16 DIAGNOSIS — M4312 Spondylolisthesis, cervical region: Secondary | ICD-10-CM | POA: Diagnosis not present

## 2022-09-16 DIAGNOSIS — M47812 Spondylosis without myelopathy or radiculopathy, cervical region: Secondary | ICD-10-CM | POA: Diagnosis not present

## 2022-09-16 DIAGNOSIS — R519 Headache, unspecified: Secondary | ICD-10-CM | POA: Diagnosis not present

## 2022-09-16 DIAGNOSIS — Z79899 Other long term (current) drug therapy: Secondary | ICD-10-CM | POA: Diagnosis not present

## 2022-09-16 DIAGNOSIS — I10 Essential (primary) hypertension: Secondary | ICD-10-CM | POA: Diagnosis not present

## 2022-09-25 DIAGNOSIS — M47812 Spondylosis without myelopathy or radiculopathy, cervical region: Secondary | ICD-10-CM | POA: Diagnosis not present

## 2022-09-25 DIAGNOSIS — R519 Headache, unspecified: Secondary | ICD-10-CM | POA: Diagnosis not present

## 2022-09-29 DIAGNOSIS — R519 Headache, unspecified: Secondary | ICD-10-CM | POA: Diagnosis not present

## 2022-10-07 DIAGNOSIS — Z8639 Personal history of other endocrine, nutritional and metabolic disease: Secondary | ICD-10-CM | POA: Diagnosis not present

## 2022-10-07 DIAGNOSIS — Z0000 Encounter for general adult medical examination without abnormal findings: Secondary | ICD-10-CM | POA: Diagnosis not present

## 2022-10-07 DIAGNOSIS — R519 Headache, unspecified: Secondary | ICD-10-CM | POA: Diagnosis not present

## 2022-10-07 DIAGNOSIS — R7303 Prediabetes: Secondary | ICD-10-CM | POA: Diagnosis not present

## 2022-10-07 DIAGNOSIS — E559 Vitamin D deficiency, unspecified: Secondary | ICD-10-CM | POA: Diagnosis not present

## 2022-10-07 DIAGNOSIS — Z96653 Presence of artificial knee joint, bilateral: Secondary | ICD-10-CM | POA: Diagnosis not present

## 2022-10-07 DIAGNOSIS — E7800 Pure hypercholesterolemia, unspecified: Secondary | ICD-10-CM | POA: Diagnosis not present

## 2022-10-07 DIAGNOSIS — K219 Gastro-esophageal reflux disease without esophagitis: Secondary | ICD-10-CM | POA: Diagnosis not present

## 2022-11-10 DIAGNOSIS — M180 Bilateral primary osteoarthritis of first carpometacarpal joints: Secondary | ICD-10-CM | POA: Diagnosis not present

## 2022-11-18 ENCOUNTER — Ambulatory Visit: Payer: Medicare Other | Admitting: Podiatry

## 2022-11-18 DIAGNOSIS — M79674 Pain in right toe(s): Secondary | ICD-10-CM | POA: Diagnosis not present

## 2022-11-21 ENCOUNTER — Ambulatory Visit: Payer: Medicare Other | Admitting: Podiatry

## 2022-11-21 DIAGNOSIS — B351 Tinea unguium: Secondary | ICD-10-CM

## 2022-11-21 NOTE — Progress Notes (Signed)
Patient presents today for the 3rd (second round) laser treatment. Diagnosed with mycotic nail infection by Dr. Charlsie Merles.  Patient states that overall she has had improvement and with the fungus but she still does have some along the distal edge of the nail.  She does have active nail growth.  Toenail most affected is the left hallux.  The remainder of the nails have nail polish on so they cannot be assessed today.  Reviewed laser safety with patient today.  Safety goggles were dispensed  Nails were filed thin with sterile nail nippers and debriding bur. Laser therapy was administered to left hallux toenail and patient tolerated the treatment well. All safety precautions were in place.   She is also taking pulse dose of terbinafine.  Follow up in 6 weeks with Dr. Charlsie Merles for re-evaluation   ~She will do 3 treatments total at 6 week intervals (Today is her final laser treatment per previous notes)

## 2022-12-29 ENCOUNTER — Ambulatory Visit: Payer: Medicare Other | Admitting: Podiatry

## 2022-12-29 ENCOUNTER — Encounter: Payer: Self-pay | Admitting: Podiatry

## 2022-12-29 DIAGNOSIS — B351 Tinea unguium: Secondary | ICD-10-CM | POA: Diagnosis not present

## 2022-12-30 NOTE — Progress Notes (Signed)
Subjective:   Patient ID: Sherry [name], female   DOB: 77 y.o.   MRN: [id number]   HPI Patient presents stating it seems better but I wanted it checked   ROS      Objective:  Physical Exam  Neuro vascular status intact with left hallux showing discoloration distal one third of the bed proximal two thirds are now clear     Assessment:  Overall doing well hopefully the rest of it will grow out and she has completed 45 days of oral antifungal     Plan:  Reviewed condition and hopefully this will grow out over the next 3 to 4 months May require pulse treatment further laser if necessary

## 2023-02-18 DIAGNOSIS — H35371 Puckering of macula, right eye: Secondary | ICD-10-CM | POA: Diagnosis not present

## 2023-02-18 DIAGNOSIS — Z961 Presence of intraocular lens: Secondary | ICD-10-CM | POA: Diagnosis not present

## 2023-02-18 DIAGNOSIS — H5202 Hypermetropia, left eye: Secondary | ICD-10-CM | POA: Diagnosis not present

## 2023-03-05 ENCOUNTER — Other Ambulatory Visit: Payer: Self-pay | Admitting: Internal Medicine

## 2023-03-05 DIAGNOSIS — Z1231 Encounter for screening mammogram for malignant neoplasm of breast: Secondary | ICD-10-CM

## 2023-03-23 DIAGNOSIS — M180 Bilateral primary osteoarthritis of first carpometacarpal joints: Secondary | ICD-10-CM | POA: Diagnosis not present

## 2023-03-24 DIAGNOSIS — D2372 Other benign neoplasm of skin of left lower limb, including hip: Secondary | ICD-10-CM | POA: Diagnosis not present

## 2023-03-24 DIAGNOSIS — D692 Other nonthrombocytopenic purpura: Secondary | ICD-10-CM | POA: Diagnosis not present

## 2023-03-24 DIAGNOSIS — L814 Other melanin hyperpigmentation: Secondary | ICD-10-CM | POA: Diagnosis not present

## 2023-03-24 DIAGNOSIS — D225 Melanocytic nevi of trunk: Secondary | ICD-10-CM | POA: Diagnosis not present

## 2023-03-24 DIAGNOSIS — L821 Other seborrheic keratosis: Secondary | ICD-10-CM | POA: Diagnosis not present

## 2023-03-24 DIAGNOSIS — L570 Actinic keratosis: Secondary | ICD-10-CM | POA: Diagnosis not present

## 2023-04-20 ENCOUNTER — Ambulatory Visit: Payer: Medicare Other

## 2023-04-22 ENCOUNTER — Ambulatory Visit: Payer: Medicare Other

## 2023-04-22 ENCOUNTER — Ambulatory Visit
Admission: RE | Admit: 2023-04-22 | Discharge: 2023-04-22 | Disposition: A | Discharge status: Home / Self Care | Payer: Medicare Other | Source: Ambulatory Visit | Attending: Internal Medicine | Admitting: Internal Medicine

## 2023-04-22 DIAGNOSIS — Z1231 Encounter for screening mammogram for malignant neoplasm of breast: Secondary | ICD-10-CM | POA: Diagnosis not present

## 2023-04-23 ENCOUNTER — Ambulatory Visit: Payer: Medicare Other

## 2023-04-23 DIAGNOSIS — M180 Bilateral primary osteoarthritis of first carpometacarpal joints: Secondary | ICD-10-CM | POA: Diagnosis not present

## 2023-04-23 DIAGNOSIS — M19011 Primary osteoarthritis, right shoulder: Secondary | ICD-10-CM | POA: Diagnosis not present

## 2023-04-23 DIAGNOSIS — M65331 Trigger finger, right middle finger: Secondary | ICD-10-CM | POA: Diagnosis not present

## 2023-05-12 DIAGNOSIS — M13831 Other specified arthritis, right wrist: Secondary | ICD-10-CM | POA: Diagnosis not present

## 2023-05-12 DIAGNOSIS — M180 Bilateral primary osteoarthritis of first carpometacarpal joints: Secondary | ICD-10-CM | POA: Diagnosis not present

## 2023-05-12 DIAGNOSIS — M65331 Trigger finger, right middle finger: Secondary | ICD-10-CM | POA: Diagnosis not present

## 2023-05-12 DIAGNOSIS — M19031 Primary osteoarthritis, right wrist: Secondary | ICD-10-CM | POA: Diagnosis not present

## 2023-05-12 DIAGNOSIS — M1811 Unilateral primary osteoarthritis of first carpometacarpal joint, right hand: Secondary | ICD-10-CM | POA: Diagnosis not present

## 2023-05-12 DIAGNOSIS — M1812 Unilateral primary osteoarthritis of first carpometacarpal joint, left hand: Secondary | ICD-10-CM | POA: Diagnosis not present

## 2023-05-27 ENCOUNTER — Ambulatory Visit: Payer: Medicare Other | Admitting: Podiatry

## 2023-05-27 DIAGNOSIS — Z4789 Encounter for other orthopedic aftercare: Secondary | ICD-10-CM | POA: Diagnosis not present

## 2023-06-10 DIAGNOSIS — Z4789 Encounter for other orthopedic aftercare: Secondary | ICD-10-CM | POA: Diagnosis not present

## 2023-06-10 DIAGNOSIS — M25632 Stiffness of left wrist, not elsewhere classified: Secondary | ICD-10-CM | POA: Diagnosis not present

## 2023-06-17 ENCOUNTER — Encounter: Payer: Self-pay | Admitting: Podiatry

## 2023-06-17 ENCOUNTER — Ambulatory Visit: Payer: Medicare Other | Admitting: Podiatry

## 2023-06-17 VITALS — Ht 65.0 in | Wt 150.0 lb

## 2023-06-17 DIAGNOSIS — B351 Tinea unguium: Secondary | ICD-10-CM | POA: Diagnosis not present

## 2023-06-17 NOTE — Progress Notes (Signed)
 Subjective:   Patient ID: Sherry [name], female   DOB: 78 y.o.   MRN: [id number]   HPI Patient presents stating she has lots of questions concerning discoloration of her left big toenail distal two thirds medial side   ROS      Objective:  Physical Exam  Neuro vascular status intact with discoloration with slight darkening of the nailbed left hallux medial and proximal two thirds of the bed     Assessment:  Combination of mycotic nail infection with probable trauma related to foot structure     Plan:  H&P reviewed spent a great deal of time trying to educate her on this condition and the fact that what she has is probably more related to trauma than it is to fungus and that right now the nail appears relatively healthy and I do not recommend oral medications discussed laser we will not pursue this and she will just utilize polish

## 2023-06-20 DIAGNOSIS — Z20822 Contact with and (suspected) exposure to covid-19: Secondary | ICD-10-CM | POA: Diagnosis not present

## 2023-06-20 DIAGNOSIS — B349 Viral infection, unspecified: Secondary | ICD-10-CM | POA: Diagnosis not present

## 2023-06-22 DIAGNOSIS — J069 Acute upper respiratory infection, unspecified: Secondary | ICD-10-CM | POA: Diagnosis not present

## 2023-06-25 DIAGNOSIS — M25641 Stiffness of right hand, not elsewhere classified: Secondary | ICD-10-CM | POA: Diagnosis not present

## 2023-07-03 DIAGNOSIS — J40 Bronchitis, not specified as acute or chronic: Secondary | ICD-10-CM | POA: Diagnosis not present

## 2023-07-07 DIAGNOSIS — M25641 Stiffness of right hand, not elsewhere classified: Secondary | ICD-10-CM | POA: Diagnosis not present

## 2023-07-08 DIAGNOSIS — Z4789 Encounter for other orthopedic aftercare: Secondary | ICD-10-CM | POA: Diagnosis not present

## 2023-08-13 DIAGNOSIS — M25641 Stiffness of right hand, not elsewhere classified: Secondary | ICD-10-CM | POA: Diagnosis not present

## 2023-08-13 DIAGNOSIS — M79642 Pain in left hand: Secondary | ICD-10-CM | POA: Diagnosis not present

## 2023-08-13 DIAGNOSIS — M180 Bilateral primary osteoarthritis of first carpometacarpal joints: Secondary | ICD-10-CM | POA: Diagnosis not present

## 2023-08-13 DIAGNOSIS — M79641 Pain in right hand: Secondary | ICD-10-CM | POA: Diagnosis not present

## 2023-08-13 DIAGNOSIS — M65331 Trigger finger, right middle finger: Secondary | ICD-10-CM | POA: Diagnosis not present

## 2023-08-13 DIAGNOSIS — Z4789 Encounter for other orthopedic aftercare: Secondary | ICD-10-CM | POA: Diagnosis not present

## 2023-08-20 DIAGNOSIS — M25531 Pain in right wrist: Secondary | ICD-10-CM | POA: Diagnosis not present

## 2023-08-20 DIAGNOSIS — M180 Bilateral primary osteoarthritis of first carpometacarpal joints: Secondary | ICD-10-CM | POA: Diagnosis not present

## 2023-08-20 DIAGNOSIS — Z4789 Encounter for other orthopedic aftercare: Secondary | ICD-10-CM | POA: Diagnosis not present

## 2023-08-28 DIAGNOSIS — Z20822 Contact with and (suspected) exposure to covid-19: Secondary | ICD-10-CM | POA: Diagnosis not present

## 2023-08-28 DIAGNOSIS — B349 Viral infection, unspecified: Secondary | ICD-10-CM | POA: Diagnosis not present

## 2023-08-28 DIAGNOSIS — R1084 Generalized abdominal pain: Secondary | ICD-10-CM | POA: Diagnosis not present

## 2023-11-11 DIAGNOSIS — M8589 Other specified disorders of bone density and structure, multiple sites: Secondary | ICD-10-CM | POA: Diagnosis not present

## 2023-11-11 DIAGNOSIS — K219 Gastro-esophageal reflux disease without esophagitis: Secondary | ICD-10-CM | POA: Diagnosis not present

## 2023-11-11 DIAGNOSIS — E7800 Pure hypercholesterolemia, unspecified: Secondary | ICD-10-CM | POA: Diagnosis not present

## 2023-11-11 DIAGNOSIS — Z23 Encounter for immunization: Secondary | ICD-10-CM | POA: Diagnosis not present

## 2023-11-11 DIAGNOSIS — Z0000 Encounter for general adult medical examination without abnormal findings: Secondary | ICD-10-CM | POA: Diagnosis not present

## 2023-11-11 DIAGNOSIS — E559 Vitamin D deficiency, unspecified: Secondary | ICD-10-CM | POA: Diagnosis not present

## 2023-11-11 DIAGNOSIS — R7303 Prediabetes: Secondary | ICD-10-CM | POA: Diagnosis not present

## 2023-11-11 DIAGNOSIS — Z8639 Personal history of other endocrine, nutritional and metabolic disease: Secondary | ICD-10-CM | POA: Diagnosis not present

## 2023-11-11 DIAGNOSIS — R195 Other fecal abnormalities: Secondary | ICD-10-CM | POA: Diagnosis not present

## 2023-11-12 ENCOUNTER — Other Ambulatory Visit (HOSPITAL_BASED_OUTPATIENT_CLINIC_OR_DEPARTMENT_OTHER): Payer: Self-pay | Admitting: Internal Medicine

## 2023-11-12 DIAGNOSIS — M19041 Primary osteoarthritis, right hand: Secondary | ICD-10-CM | POA: Diagnosis not present

## 2023-11-12 DIAGNOSIS — M8589 Other specified disorders of bone density and structure, multiple sites: Secondary | ICD-10-CM

## 2023-11-12 DIAGNOSIS — M79641 Pain in right hand: Secondary | ICD-10-CM | POA: Diagnosis not present

## 2023-11-12 DIAGNOSIS — M19042 Primary osteoarthritis, left hand: Secondary | ICD-10-CM | POA: Diagnosis not present

## 2023-11-12 DIAGNOSIS — M180 Bilateral primary osteoarthritis of first carpometacarpal joints: Secondary | ICD-10-CM | POA: Diagnosis not present

## 2023-11-12 DIAGNOSIS — M1812 Unilateral primary osteoarthritis of first carpometacarpal joint, left hand: Secondary | ICD-10-CM | POA: Diagnosis not present

## 2023-12-03 DIAGNOSIS — L821 Other seborrheic keratosis: Secondary | ICD-10-CM | POA: Diagnosis not present

## 2023-12-03 DIAGNOSIS — L820 Inflamed seborrheic keratosis: Secondary | ICD-10-CM | POA: Diagnosis not present

## 2023-12-21 DIAGNOSIS — S76311D Strain of muscle, fascia and tendon of the posterior muscle group at thigh level, right thigh, subsequent encounter: Secondary | ICD-10-CM | POA: Diagnosis not present

## 2023-12-21 DIAGNOSIS — M79651 Pain in right thigh: Secondary | ICD-10-CM | POA: Diagnosis not present

## 2023-12-28 DIAGNOSIS — S76311D Strain of muscle, fascia and tendon of the posterior muscle group at thigh level, right thigh, subsequent encounter: Secondary | ICD-10-CM | POA: Diagnosis not present

## 2023-12-28 DIAGNOSIS — M79651 Pain in right thigh: Secondary | ICD-10-CM | POA: Diagnosis not present

## 2024-01-07 DIAGNOSIS — M79651 Pain in right thigh: Secondary | ICD-10-CM | POA: Diagnosis not present

## 2024-01-07 DIAGNOSIS — S76311D Strain of muscle, fascia and tendon of the posterior muscle group at thigh level, right thigh, subsequent encounter: Secondary | ICD-10-CM | POA: Diagnosis not present

## 2024-01-11 DIAGNOSIS — M79651 Pain in right thigh: Secondary | ICD-10-CM | POA: Diagnosis not present

## 2024-01-15 DIAGNOSIS — S76311D Strain of muscle, fascia and tendon of the posterior muscle group at thigh level, right thigh, subsequent encounter: Secondary | ICD-10-CM | POA: Diagnosis not present

## 2024-01-16 DIAGNOSIS — M25551 Pain in right hip: Secondary | ICD-10-CM | POA: Diagnosis not present

## 2024-01-19 DIAGNOSIS — S76311D Strain of muscle, fascia and tendon of the posterior muscle group at thigh level, right thigh, subsequent encounter: Secondary | ICD-10-CM | POA: Diagnosis not present

## 2024-01-19 DIAGNOSIS — M79651 Pain in right thigh: Secondary | ICD-10-CM | POA: Diagnosis not present

## 2024-01-20 DIAGNOSIS — M180 Bilateral primary osteoarthritis of first carpometacarpal joints: Secondary | ICD-10-CM | POA: Diagnosis not present

## 2024-01-20 DIAGNOSIS — M65341 Trigger finger, right ring finger: Secondary | ICD-10-CM | POA: Diagnosis not present

## 2024-01-20 DIAGNOSIS — Z4789 Encounter for other orthopedic aftercare: Secondary | ICD-10-CM | POA: Diagnosis not present

## 2024-01-20 DIAGNOSIS — M19042 Primary osteoarthritis, left hand: Secondary | ICD-10-CM | POA: Diagnosis not present

## 2024-01-20 DIAGNOSIS — M19041 Primary osteoarthritis, right hand: Secondary | ICD-10-CM | POA: Diagnosis not present

## 2024-03-30 ENCOUNTER — Other Ambulatory Visit: Payer: Self-pay | Admitting: Internal Medicine

## 2024-03-30 DIAGNOSIS — Z1231 Encounter for screening mammogram for malignant neoplasm of breast: Secondary | ICD-10-CM

## 2024-04-16 DIAGNOSIS — M180 Bilateral primary osteoarthritis of first carpometacarpal joints: Secondary | ICD-10-CM | POA: Insufficient documentation

## 2024-04-16 NOTE — Progress Notes (Signed)
 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 A diagnosis of Primary osteoarthritis of left hand was also pertinent to this visit. M18.0, M19.042   HISTORY OF PRESENT ILLNESS: Sherry [name] is a 79 y.o. female seen in consultation for radiation therapy.  She has a long personal and family hx of multijoint arthritis, including bilateral knee osteoarthritis form which she is s/p simultaneous bilateral total knee replacements in 2019 as well as bilateral shoulder replacements. Most recently she has been suffering from bilateral CMC joint and bilateral hand osteoarthritis. She underwent R CMC arthroplasty in early 2025. She said this was perhaps the most disabling of the surgeries she has had due to the brace and sling she had to wear for weeks to months. It was very disabling. She has been considering surgical treatment on the L but interested in alternative treatments.  She describes the pain in her L CMC joint and hand as sharp. She reports it is becoming progressively worse over time. Twisting, gripping, grasping and squeezing aggravate her sx. She also reports weakness, catching/locking and popping/clicking. These sx are pronounced in her [facility] joint but also present throughout the L hand. She's received multiple steroid injections which provide temporary relief. She uses Aleve for the pain usually although given a recent steroid injection she has not needed any Aleve recently.   Her sx interfere with her ability to play golf and tennis and be as active as she would like to be. She travels often and has many grandchildren and needs improved mobility/function to be as active as she is  accustomed to being.  PREVIOUS RADIATION THERAPY: No  AUTOIMMUNE DISEASE: No  MEDICAL DEVICES: No  PREGNANCY: No   PAST MEDICAL HISTORY:  Past Medical History:  Diagnosis Date   Anxiety    Cancer (HCC)    Carpal tunnel syndrome on both sides    Chronic back pain    herniated disc,stenosis,radiculopathy,spondylolisthesis   Chronic neck pain    Constipation, chronic    Degenerative spinal arthritis    cervical and lumbar   Depression    Eczema    GERD (gastroesophageal reflux disease)    Hemorrhoids    Hiatal hernia    History of bronchitis    I've had it a number of times   History of COVID-19 04/22/2019   IBS (irritable bowel syndrome)    lactose intolerant   Osteoarthritis    bilateral knees and shoulders,  little fingers   TMJ (dislocation of temporomandibular joint)    Wears glasses        PAST SURGICAL HISTORY: Past Surgical History:  Procedure Laterality Date   APPENDECTOMY  1967   BACK SURGERY     BLADDER SUSPENSION  1980's   sling   BREAST BIOPSY Left 1980's   twice on the left   BREAST LUMPECTOMY     x 2   BREAST REDUCTION SURGERY Bilateral 1988   CARPAL TUNNEL RELEASE Bilateral 2020   CERVIX LESION DESTRUCTION  age 10s   DEBRIDEMENT COMPLEX MUCOID CYST, ARTHROTOMY W/ SYNOVECTOMY Left 07-21-2008  dr sypher [facility]   Left long finger   DILATION AND CURETTAGE OF  UTERUS  1988   INCISION AND DRAINAGE ABSCESS / HEMATOMA OF BURSA / KNEE / THIGH  1994   day after knee scope   JOINT REPLACEMENT     bilateral knees    KNEE ARTHROSCOPY  1994   unilateral   POSTERIOR FUSION LUMBAR SPINE  02/2011   L3-L5   REDUCTION MAMMAPLASTY Bilateral    TONSILLECTOMY  1951   TOTAL KNEE ARTHROPLASTY Bilateral 05/27/2017   Procedure: TOTAL KNEE BILATERAL;  Surgeon: Melodi Lerner, MD;  Location: [facility];  Service: Orthopedics;  Laterality: Bilateral;   TOTAL SHOULDER ARTHROPLASTY Right 03/01/2020   Procedure: Right shoulder anatomic arthroplasty;  Surgeon: Melita Drivers, MD;  Location: [facility];  Service: Orthopedics;  Laterality: Right;    TOTAL SHOULDER ARTHROPLASTY Left 01/17/2021   Procedure: TOTAL SHOULDER ARTHROPLASTY;  Surgeon: Melita Drivers, MD;  Location: [facility];  Service: Orthopedics;  Laterality: Left;      FAMILY HISTORY:  Family History  Problem Relation Age of Onset   Kidney disease Father    Rheum arthritis Father    Parkinson's disease Mother    Leukemia Sister    Colon cancer Maternal Grandfather        questionable   Breast cancer Paternal Aunt    Anesthesia problems Neg Hx    Hypotension Neg Hx    Malignant hyperthermia Neg Hx    Pseudochol deficiency Neg Hx      SOCIAL HISTORY:  reports that she quit smoking about 56 years ago. Her smoking use included cigarettes. She started smoking about 60 years ago. She has never used smokeless tobacco. She reports current alcohol  use of about 5.0 - 6.0 standard drinks of alcohol  per week. She reports that she does not use drugs.   ALLERGIES: Adhesive [tape] and Mederma   MEDICATIONS:  Current Outpatient Medications  Medication Sig Dispense Refill   diclofenac Sodium (VOLTAREN) 1 % GEL Apply 1 application topically daily as needed (pain).     diphenhydramine -acetaminophen  (TYLENOL  PM) 25-500 MG TABS tablet Take 0.5 tablets by mouth at bedtime as needed (pain).     famotidine  (PEPCID ) 20 MG tablet Take 20 mg by mouth as needed for heartburn or indigestion.     gabapentin  (NEURONTIN ) 300 MG capsule Take 300 mg by mouth at bedtime.      ketoconazole (NIZORAL) 2 % cream Apply 1 application topically daily as needed (scaly skin).     ketoconazole (NIZORAL) 2 % shampoo Apply 1 application topically daily as needed (itchy scalp).      polyethylene glycol (MIRALAX ) packet Take 17 g by mouth 3 (three) times daily. Until bowels move; maximum 2 consecutive days (Patient taking differently: Take 8.5-17 g by mouth daily as needed for moderate constipation.) 6 each 0   Propylene  Glycol (SYSTANE BALANCE) 0.6 % SOLN Place 1 drop into both eyes daily as needed (dry eyes).     terbinafine  (LAMISIL ) 250 MG tablet Take 1 tablet (250 mg total) by mouth daily. 45 tablet 0   terbinafine  (LAMISIL ) 250 MG tablet Take 1 tablet (250 mg total) by mouth daily. 45 tablet 0   No current facility-administered medications for this encounter.    REVIEW OF SYSTEMS: The patient reports that she is doing well overall and a review of symptoms is otherwise negative.     PHYSICAL EXAM:  Wt Readings from Last 3 Encounters:  04/21/24 146 lb (66.2 kg)  04/21/24 146 lb 6.4 oz (66.4 kg)  06/17/23 150 lb (68 kg)  Temp Readings from Last 3 Encounters:  04/21/24 98.3 F (36.8 C)  08/04/21 98.3 F (36.8 C) (Oral)   BP Readings from Last 3 Encounters:  04/21/24 123/73  04/21/24 123/73  08/04/21 137/80   Pulse Readings from Last 3 Encounters:  04/21/24 77  08/04/21 80  01/17/21 86   Pain Assessment Pain Score: 0-No pain/10   Physical Exam Vitals and nursing note reviewed.  Constitutional:      General: She is not in acute distress. HENT:     Head: Normocephalic and atraumatic.  Eyes:     Extraocular Movements: Extraocular movements intact.  Cardiovascular:     Rate and Rhythm: Normal rate.  Pulmonary:     Effort: Pulmonary effort is normal. No respiratory distress.  Abdominal:     General: There is no distension.  Musculoskeletal:        General: Swelling and deformity present.     Cervical back: Normal range of motion.     Comments: Swelling and deformity present in various PIP and DIP joints of the L hand  Skin:    General: Skin is warm and dry.     Coloration: Skin is not pale.  Neurological:     General: No focal deficit present.     Mental Status: She is alert.     Gait: Gait normal.  Psychiatric:        Mood and Affect: Mood normal.      LABORATORY DATA:  Lab Results  Component Value Date   WBC 5.4 01/08/2021   HGB 14.9 01/08/2021   HCT 44.6  01/08/2021   MCV 95.9 01/08/2021   PLT 368 01/08/2021   Lab Results  Component Value Date   NA 136 06/12/2017   K 4.0 06/12/2017   CL 102 06/12/2017   CO2 24 06/12/2017   Lab Results  Component Value Date   ALT 27 06/03/2017   AST 30 06/03/2017   ALKPHOS 41 06/03/2017   BILITOT 0.9 06/03/2017      RADIOGRAPHY: No available imaging   PATHOLOGY: No pertinent pathology     IMPRESSION/PLAN:  Ms. Consuegra is a 79 yo F w/ moderate-severe multijoint arthritis, most significantly affecting her L CMC joint and hand, which is not adequately responsive to conservative measures. She is interested in avoiding a surgical procedure if possible and interested in low dose radiotherapy.  After a detailed discussion of the patients history, prior treatment response, and current goals, we reviewed the proposed protocol for LDRT targeting the L hand. Our institutional approach follows a regimen of 3 Gy total, delivered in 6 fractions of 0.5 Gy each over 2-3 weeks. This dosing is consistent with published guidelines and peer-reviewed data for non-malignant musculoskeletal conditions.  We reviewed:   Goals of care: symptom relief and improved mobility Expected timeline for therapeutic benefit: typically several weeks to months Potential risks, including rare but theoretical concerns regarding late tissue effects or secondary malignancy (risk estimated to be extremely low at this dose and age) Lack of immunosuppression and inactive autoimmune history, minimizing concern for immune-mediated complications   My goal with low dose radiation therapy is to help reduce chronic joint pain and improve mobility, particularly for activities like playing golf and tennis. This treatment does not reverse joint damage, but it can calm the inflammation in and around the joint that contributes to pain. Based on published data, about 60-80% of patients with osteoarthritis experience meaningful pain relief within a few  weeks to months after completing a short  course of low-dose radiation. While not everyone responds, the majority of patients do report improvement, and the treatment is generally well tolerated.  The patient was agreeable to proceed. We will arrange CT simulation for planning and initiate treatment pending final review and setup. A follow-up visit will be scheduled approximately 8-12 weeks after completion of LDRT to assess clinical response.   We personally spent 60 minutes in this encounter including chart review, reviewing radiological studies, meeting face-to-face with the patient, entering orders and completing documentation.    Estefana HERO. Maritza, M.D.   "

## 2024-04-19 NOTE — Progress Notes (Signed)
 Site of osteoarthritis:Osteoarthritis of carpometacarpal (CMC) joint of both thumbs. M18.0   How long have you had pain?  Approx. 2 years  What over the counter or prescription medications have you tried? She's received multiple steroid injections which provide temporary relief. Also Aleve  Does anything make the pain better or worse? Regular activity makes it worse.      Ambulatory status? Walker? Wheelchair?: Ambulatory  SAFETY ISSUES: Prior radiation? No Pacemaker/ICD? No Possible current pregnancy? N/A Is the patient on methotrexate? No  Current Complaints / other details:  Approx. 2 years   BP 123/73 (Patient Position: Sitting)   Temp 98.3 F (36.8 C)   Resp 17   Wt 146 lb (66.2 kg)   LMP 04/14/1997   BMI 24.30 kg/m

## 2024-04-21 ENCOUNTER — Ambulatory Visit
Admission: RE | Admit: 2024-04-21 | Discharge: 2024-04-21 | Disposition: A | Discharge status: Home / Self Care | Source: Ambulatory Visit | Attending: Radiation Oncology | Admitting: Radiation Oncology

## 2024-04-21 ENCOUNTER — Ambulatory Visit
Admission: RE | Admit: 2024-04-21 | Discharge: 2024-04-21 | Disposition: A | Discharge status: Home / Self Care | Payer: Self-pay | Source: Ambulatory Visit | Attending: Radiation Oncology | Admitting: Radiation Oncology

## 2024-04-21 ENCOUNTER — Encounter: Payer: Self-pay | Admitting: Radiation Oncology

## 2024-04-21 VITALS — BP 123/73 | Temp 98.3°F | Resp 17 | Wt 146.0 lb

## 2024-04-21 VITALS — BP 123/73 | HR 77 | Resp 17 | Ht 65.0 in | Wt 146.4 lb

## 2024-04-21 DIAGNOSIS — K219 Gastro-esophageal reflux disease without esophagitis: Secondary | ICD-10-CM | POA: Insufficient documentation

## 2024-04-21 DIAGNOSIS — Z87891 Personal history of nicotine dependence: Secondary | ICD-10-CM | POA: Insufficient documentation

## 2024-04-21 DIAGNOSIS — K449 Diaphragmatic hernia without obstruction or gangrene: Secondary | ICD-10-CM | POA: Diagnosis not present

## 2024-04-21 DIAGNOSIS — K589 Irritable bowel syndrome without diarrhea: Secondary | ICD-10-CM | POA: Insufficient documentation

## 2024-04-21 DIAGNOSIS — Z8616 Personal history of COVID-19: Secondary | ICD-10-CM | POA: Insufficient documentation

## 2024-04-21 DIAGNOSIS — K5900 Constipation, unspecified: Secondary | ICD-10-CM | POA: Diagnosis not present

## 2024-04-21 DIAGNOSIS — M180 Bilateral primary osteoarthritis of first carpometacarpal joints: Secondary | ICD-10-CM

## 2024-04-21 DIAGNOSIS — M1990 Unspecified osteoarthritis, unspecified site: Secondary | ICD-10-CM | POA: Diagnosis not present

## 2024-04-21 DIAGNOSIS — Z800 Family history of malignant neoplasm of digestive organs: Secondary | ICD-10-CM | POA: Diagnosis not present

## 2024-04-21 DIAGNOSIS — M19042 Primary osteoarthritis, left hand: Secondary | ICD-10-CM | POA: Insufficient documentation

## 2024-04-21 DIAGNOSIS — Z79899 Other long term (current) drug therapy: Secondary | ICD-10-CM | POA: Diagnosis not present

## 2024-04-21 DIAGNOSIS — Z803 Family history of malignant neoplasm of breast: Secondary | ICD-10-CM | POA: Insufficient documentation

## 2024-04-22 ENCOUNTER — Ambulatory Visit
Admission: RE | Admit: 2024-04-22 | Discharge: 2024-04-22 | Disposition: A | Source: Ambulatory Visit | Attending: Internal Medicine | Admitting: Internal Medicine

## 2024-04-22 DIAGNOSIS — Z1231 Encounter for screening mammogram for malignant neoplasm of breast: Secondary | ICD-10-CM

## 2024-04-28 ENCOUNTER — Ambulatory Visit
Admission: RE | Admit: 2024-04-28 | Discharge: 2024-04-28 | Disposition: A | Discharge status: Home / Self Care | Source: Ambulatory Visit | Attending: Radiation Oncology | Admitting: Radiation Oncology

## 2024-04-28 DIAGNOSIS — M19042 Primary osteoarthritis, left hand: Secondary | ICD-10-CM | POA: Insufficient documentation

## 2024-05-02 DIAGNOSIS — M19042 Primary osteoarthritis, left hand: Secondary | ICD-10-CM | POA: Diagnosis not present

## 2024-05-16 ENCOUNTER — Ambulatory Visit: Admitting: Radiation Oncology

## 2024-05-18 ENCOUNTER — Ambulatory Visit
Admission: RE | Admit: 2024-05-18 | Discharge: 2024-05-18 | Disposition: A | Source: Ambulatory Visit | Attending: Radiation Oncology | Admitting: Radiation Oncology

## 2024-05-18 ENCOUNTER — Other Ambulatory Visit: Payer: Self-pay

## 2024-05-18 ENCOUNTER — Ambulatory Visit
Admission: RE | Admit: 2024-05-18 | Discharge: 2024-05-18 | Disposition: A | Source: Ambulatory Visit | Attending: Radiation Oncology

## 2024-05-18 ENCOUNTER — Ambulatory Visit: Admission: RE | Admit: 2024-05-18 | Discharge: 2024-05-18 | Attending: Radiation Oncology

## 2024-05-18 LAB — RAD ONC ARIA SESSION SUMMARY
Course Elapsed Days: 0
Plan Fractions Treated to Date: 1
Plan Prescribed Dose Per Fraction: 0.5 Gy
Plan Total Fractions Prescribed: 6
Plan Total Prescribed Dose: 3 Gy
Reference Point Dosage Given to Date: 0.5 Gy
Reference Point Session Dosage Given: 0.5 Gy
Session Number: 1

## 2024-05-20 ENCOUNTER — Ambulatory Visit: Admission: RE | Admit: 2024-05-20 | Admitting: Radiation Oncology

## 2024-05-20 ENCOUNTER — Other Ambulatory Visit: Payer: Self-pay

## 2024-05-20 ENCOUNTER — Ambulatory Visit: Admitting: Radiation Oncology

## 2024-05-20 LAB — RAD ONC ARIA SESSION SUMMARY
Course Elapsed Days: 2
Plan Fractions Treated to Date: 1
Plan Fractions Treated to Date: 2
Plan Prescribed Dose Per Fraction: 0.5 Gy
Plan Prescribed Dose Per Fraction: 0.5 Gy
Plan Total Fractions Prescribed: 6
Plan Total Fractions Prescribed: 6
Plan Total Prescribed Dose: 3 Gy
Plan Total Prescribed Dose: 3 Gy
Reference Point Dosage Given to Date: 0.5 Gy
Reference Point Dosage Given to Date: 1 Gy
Reference Point Session Dosage Given: 0.5 Gy
Reference Point Session Dosage Given: 0.5 Gy
Session Number: 2

## 2024-05-23 ENCOUNTER — Ambulatory Visit

## 2024-05-25 ENCOUNTER — Ambulatory Visit

## 2024-05-27 ENCOUNTER — Ambulatory Visit

## 2024-05-30 ENCOUNTER — Ambulatory Visit

## 2024-05-31 ENCOUNTER — Ambulatory Visit
